# Patient Record
Sex: Female | Born: 1960 | Hispanic: Yes | Marital: Single | Smoking: Never smoker
Health system: Southern US, Community
[De-identification: ages and names within clinical notes are randomized; demographics above are authoritative.]

## PROBLEM LIST (undated history)

## (undated) DIAGNOSIS — R55 Syncope and collapse: Secondary | ICD-10-CM

## (undated) DIAGNOSIS — N159 Renal tubulo-interstitial disease, unspecified: Secondary | ICD-10-CM

## (undated) DIAGNOSIS — K859 Acute pancreatitis without necrosis or infection, unspecified: Secondary | ICD-10-CM

## (undated) HISTORY — PX: TUBAL LIGATION: SHX77

---

## 2002-03-12 ENCOUNTER — Emergency Department (HOSPITAL_COMMUNITY): Admission: EM | Admit: 2002-03-12 | Discharge: 2002-03-12 | Payer: Self-pay | Admitting: Emergency Medicine

## 2005-09-10 ENCOUNTER — Inpatient Hospital Stay (HOSPITAL_COMMUNITY): Admission: AD | Admit: 2005-09-10 | Discharge: 2005-09-13 | Payer: Self-pay | Admitting: Sports Medicine

## 2005-09-10 ENCOUNTER — Encounter: Payer: Self-pay | Admitting: Obstetrics and Gynecology

## 2005-09-10 ENCOUNTER — Ambulatory Visit: Payer: Self-pay | Admitting: Sports Medicine

## 2005-09-22 ENCOUNTER — Ambulatory Visit: Payer: Self-pay | Admitting: Family Medicine

## 2005-09-26 ENCOUNTER — Ambulatory Visit (HOSPITAL_COMMUNITY): Admission: RE | Admit: 2005-09-26 | Discharge: 2005-09-26 | Payer: Self-pay | Admitting: Family Medicine

## 2006-05-11 DIAGNOSIS — D509 Iron deficiency anemia, unspecified: Secondary | ICD-10-CM

## 2009-10-28 ENCOUNTER — Encounter (INDEPENDENT_AMBULATORY_CARE_PROVIDER_SITE_OTHER): Payer: Self-pay | Admitting: Family Medicine

## 2009-10-28 ENCOUNTER — Ambulatory Visit: Payer: Self-pay | Admitting: Internal Medicine

## 2009-10-28 LAB — CONVERTED CEMR LAB
Albumin: 4.5 g/dL (ref 3.5–5.2)
Alkaline Phosphatase: 88 units/L (ref 39–117)
BUN: 17 mg/dL (ref 6–23)
Basophils Relative: 0 % (ref 0–1)
CRP: 0.4 mg/dL (ref <0.6)
Chloride: 108 meq/L (ref 96–112)
Eosinophils Absolute: 0.1 10*3/uL (ref 0.0–0.7)
Glucose, Bld: 108 mg/dL — ABNORMAL HIGH (ref 70–99)
Lymphs Abs: 2.2 10*3/uL (ref 0.7–4.0)
Neutro Abs: 2.1 10*3/uL (ref 1.7–7.7)
Neutrophils Relative %: 44 % (ref 43–77)
TSH: 1.806 microintl units/mL (ref 0.350–4.500)
Total Protein: 7.2 g/dL (ref 6.0–8.3)

## 2009-10-29 ENCOUNTER — Encounter (INDEPENDENT_AMBULATORY_CARE_PROVIDER_SITE_OTHER): Payer: Self-pay | Admitting: Family Medicine

## 2009-11-03 ENCOUNTER — Ambulatory Visit (HOSPITAL_COMMUNITY): Admission: RE | Admit: 2009-11-03 | Discharge: 2009-11-03 | Payer: Self-pay | Admitting: Internal Medicine

## 2010-07-30 NOTE — H&P (Signed)
Evelyn Dillon, Evelyn Dillon                ACCOUNT NO.:  [id number]   MEDICAL RECORD NO.:  [id number]          PATIENT TYPE:  INP   LOCATION:  5020                         FACILITY:  [facility]   PHYSICIAN:  Melina Fiddler, MD DATE OF BIRTH:  1961/01/24   DATE OF ADMISSION:  09/10/2005  DATE OF DISCHARGE:                                HISTORY & PHYSICAL   CHIEF COMPLAINT:  Flank pain/fever.   HISTORY OF PRESENT ILLNESS:  A 50 year old Hispanic female presents with a  two-day history of flank pain that radiates to the abdomen/flank.  She has a  history of pyelonephritis x2 including at least one hospitalization.  She  has also noted burning during urination.  Her last menstrual period was Jul 22, 2005.  Urine pregnancy is negative.  She has had nausea/vomiting x24  hours and currently cannot take p.o.  She was transferred here from the  [facility] MAU (maternity admissions unit) where she was worked up.  She has had fevers and chills over the last two days.  She also ha shad  headache and generalized weakness with positive breast tenderness that is  consistent with the timing of her period.  No numbness, positive  constipation, no diarrhea.  No palpitations or shortness of breath.   PAST MEDICAL HISTORY:  1.  Status post spontaneous vaginal delivery x7.  2.  Status post bilateral tubal ligation.  3.  History of pyelonephritis x2.  No other medical problems.   MEDICATIONS:  Advil p.r.n.   ALLERGIES:  No known drug allergies.   SOCIAL HISTORY:  Hispanic female that has lived here for only one year.   FAMILY HISTORY:  None.   PHYSICAL EXAMINATION:  VITAL SIGNS:  Temperature 98, pulse 63, respiratory  rate is 20, blood pressure 105/70.  She is saturating 97% on room air, 143  pounds, 61.5 inches, tall.  GENERAL:  Appears in mild to moderate pain.  Overweight Hispanic female.  HEENT:  Normal external eyes, ears, hearing.  No thyromegaly.  PULMONARY:  Clear to auscultation  bilaterally.  BACK:  No CVA tenderness on the right.  No CVA on the left.  BREASTS:  There is bilateral breast tenderness diffusely.  CARDIOVASCULAR:  Regular rate and rhythm.  No murmurs.  2+ dorsalis pedis  pulses.  ABDOMEN:  Soft and nontender.  Normal active bowel sounds.  No  hepatosplenomegaly.  EXTREMITIES:  5/5 strength with full range of motion and no edema.  GENITOURINARY:  This exam was normal per MAU except for a yellowish  discharge.   LABORATORY DATA:  Sodium up to 138 from 134, potassium 4.6, chloride 108,  bicarbonate 25, BUN 5, creatinine 0.8.  Sugar 127, calcium 8.6, white count  12.5 with ANC of 11.2, hemoglobin 9.2, MCV was 69.5, hematocrit 28.6.  Platelets 198,000.  Lipase 13, amylase 33.  Wet prep positive for clue  cells, otherwise negative.  Urinalysis positive for nitrite and moderate  leukocyte esterase, 30 of protein, 100 of glucose, 11 to 20 whites with few  bacteria with urine culture pending.  GC and Chlamydia tests were pending.  Liver function tests were normal except for an AST that was just a touch  above normal at 47.  Urine pregnancy was negative as stated above.   ASSESSMENT:  A 50 year old Hispanic female with pyelonephritis.   PLAN:  1.  Pyelonephritis:  We will admit the patient and hydrate with lactated      Ringer at 250 mL per hour.  We will also advance diet as tolerated.  We      will treat with IV antibiotics (Cipro).  We will follow the culture      results.  The patient will get Zofran p.r.n. for nausea and      morphine/Percocet p.r.n. for pain.  2.  Microcytic anemia:  This is probably iron-deficiency anemia.  The      patient will need iron sulfate supplementation after pyelonephritis      clears.   DISPOSITION:  The patient will be discharged once she is taking p.o.  consistently and has advanced her diet.      Angeline Slim, M.D.    ______________________________  Melina Fiddler, MD    AL/MEDQ  D:  09/10/2005  T:   09/10/2005  Job:  [id number]

## 2010-07-30 NOTE — Discharge Summary (Signed)
Evelyn Dillon, Evelyn Dillon                ACCOUNT NO.:  [id number]   MEDICAL RECORD NO.:  [id number]          PATIENT TYPE:  INP   LOCATION:  5020                         FACILITY:  [facility]   PHYSICIAN:  Sylvan Cheese, M.D.       DATE OF BIRTH:  05-Sep-1960   DATE OF ADMISSION:  09/10/2005  DATE OF DISCHARGE:  09/13/2005                                 DISCHARGE SUMMARY   DISCHARGE DIAGNOSES:  1.  Pyelonephritis.  2.  Iron deficiency anemia.   DISCHARGE MEDICATIONS:  1.  Ciprofloxacin 500 mg b.i.d. x7 days.  2.  Iron sulfate 325 mg p.o. b.i.d.  3.  MiraLax 17 gram p.o. daily p.r.n. constipation.   FOLLOWUP APPOINTMENT:  Patient is to follow up with Dr. Sylvan Cheese at [facility] on July 12th at 1:23 p.m.   FOLLOWUP LABS/TESTS:  Patient will need urinalysis and urine culture in one  to two weeks to insure adequate resolution of complicated urinary tract  infection.  If patient becomes symptomatic she may require Flagyl or  bacterial vaginosis on wet prep.   PROCEDURES:  09/10/05-CT of abdomen and pelvis:  Right ureteral calculus  verses phlebolith, proximal right hydroureter, right sided perinephric  stranding.   CONSULTATIONS:  None.   HOSPITAL COURSE:  50 year old Hispanic female admitted with 2 day history of  flank pain, burning with urination, nausea, vomiting, fevers, and chills.  Patient has history of pyelonephritis x2 in the past.  GC/chlamydia and  urine pregnancy tests were all negative, however wet prep did show some clue  cells.  Urinalysis was positive for nitrates, moderate leukocyte esterase,  few bacteria, and 11-20 white blood cells.  Urine culture did grow E. coli  with sensitivity pending at the time of discharge.  The patient was started  on ciprofloxacin 400 mg IV b.i.d. upon admission.  There was some debate as  to whether a small calculus seen on abdominal CT was inside or next to the  ureter.  Her urine was screened throughout admission,  however, no stone was  recovered.  She did have a low grade fever to 100.6 the evening of 09/11/05.  By the morning of  09/12/05, she was tolerating a regular diet and denied  nausea and vomiting.   The patient was found to be anemic, but stable, throughout admission.  Hemoglobin went from 10.3 on admission to 8.3 the morning of discharge.  This is found to be partly dilutional.  Iron studies were performed.  Iron  was less than  10 and ferritin was 43, there is certainly iron deficiency anemia has a role  and she should be started on iron supplement at discharge as well as a stool  softener.   Dictation performed by Dr. Sylvan Cheese for Dr. Sherron Monday, MD.           ______________________________  Sylvan Cheese, M.D.     MJ/MEDQ  D:  09/13/2005  T:  09/13/2005  Job:  [id number]

## 2010-10-01 ENCOUNTER — Other Ambulatory Visit: Payer: Self-pay | Admitting: Family Medicine

## 2011-01-11 ENCOUNTER — Other Ambulatory Visit (HOSPITAL_COMMUNITY): Payer: Self-pay | Admitting: Family Medicine

## 2011-01-11 DIAGNOSIS — Z1231 Encounter for screening mammogram for malignant neoplasm of breast: Secondary | ICD-10-CM

## 2011-02-10 ENCOUNTER — Ambulatory Visit (HOSPITAL_COMMUNITY)
Admission: RE | Admit: 2011-02-10 | Discharge: 2011-02-10 | Disposition: A | Discharge status: Home / Self Care | Payer: Self-pay | Source: Ambulatory Visit | Attending: Family Medicine | Admitting: Family Medicine

## 2011-02-10 DIAGNOSIS — Z1231 Encounter for screening mammogram for malignant neoplasm of breast: Secondary | ICD-10-CM

## 2014-05-06 ENCOUNTER — Emergency Department (HOSPITAL_COMMUNITY): Payer: Self-pay

## 2014-05-06 ENCOUNTER — Encounter (HOSPITAL_COMMUNITY): Payer: Self-pay | Admitting: Emergency Medicine

## 2014-05-06 ENCOUNTER — Observation Stay (HOSPITAL_COMMUNITY)
Admission: EM | Admit: 2014-05-06 | Discharge: 2014-05-08 | Disposition: A | Discharge status: Home / Self Care | Payer: Self-pay | Attending: Cardiology | Admitting: Cardiology

## 2014-05-06 DIAGNOSIS — S0993XA Unspecified injury of face, initial encounter: Secondary | ICD-10-CM | POA: Diagnosis present

## 2014-05-06 DIAGNOSIS — J320 Chronic maxillary sinusitis: Secondary | ICD-10-CM | POA: Insufficient documentation

## 2014-05-06 DIAGNOSIS — R55 Syncope and collapse: Principal | ICD-10-CM | POA: Insufficient documentation

## 2014-05-06 DIAGNOSIS — R079 Chest pain, unspecified: Secondary | ICD-10-CM | POA: Insufficient documentation

## 2014-05-06 DIAGNOSIS — M79602 Pain in left arm: Secondary | ICD-10-CM | POA: Insufficient documentation

## 2014-05-06 HISTORY — DX: Renal tubulo-interstitial disease, unspecified: N15.9

## 2014-05-06 HISTORY — DX: Syncope and collapse: R55

## 2014-05-06 LAB — CBC WITH DIFFERENTIAL/PLATELET
Basophils Absolute: 0 10*3/uL (ref 0.0–0.1)
Basophils Relative: 0 % (ref 0–1)
EOS ABS: 0.1 10*3/uL (ref 0.0–0.7)
Eosinophils Relative: 2 % (ref 0–5)
HCT: 37.5 % (ref 36.0–46.0)
HEMOGLOBIN: 12.2 g/dL (ref 12.0–15.0)
LYMPHS ABS: 1.3 10*3/uL (ref 0.7–4.0)
LYMPHS PCT: 34 % (ref 12–46)
MCH: 27.5 pg (ref 26.0–34.0)
MCHC: 32.5 g/dL (ref 30.0–36.0)
MCV: 84.5 fL (ref 78.0–100.0)
Monocytes Absolute: 0.2 10*3/uL (ref 0.1–1.0)
Monocytes Relative: 5 % (ref 3–12)
NEUTROS PCT: 59 % (ref 43–77)
Neutro Abs: 2.2 10*3/uL (ref 1.7–7.7)
Platelets: 184 10*3/uL (ref 150–400)
RBC: 4.44 MIL/uL (ref 3.87–5.11)
RDW: 13.2 % (ref 11.5–15.5)
WBC: 3.8 10*3/uL — ABNORMAL LOW (ref 4.0–10.5)

## 2014-05-06 LAB — COMPREHENSIVE METABOLIC PANEL
ALBUMIN: 3.8 g/dL (ref 3.5–5.2)
ALK PHOS: 73 U/L (ref 39–117)
ALT: 21 U/L (ref 0–35)
ANION GAP: 7 (ref 5–15)
AST: 30 U/L (ref 0–37)
BILIRUBIN TOTAL: 0.3 mg/dL (ref 0.3–1.2)
BUN: 16 mg/dL (ref 6–23)
CHLORIDE: 112 mmol/L (ref 96–112)
CO2: 23 mmol/L (ref 19–32)
Calcium: 8.6 mg/dL (ref 8.4–10.5)
Creatinine, Ser: 0.74 mg/dL (ref 0.50–1.10)
GFR calc Af Amer: >90 mL/min (ref >90)
Glucose, Bld: 101 mg/dL — ABNORMAL HIGH (ref 70–99)
POTASSIUM: 4 mmol/L (ref 3.5–5.1)
Sodium: 142 mmol/L (ref 135–145)
Total Protein: 6.3 g/dL (ref 6.0–8.3)

## 2014-05-06 LAB — URINALYSIS, ROUTINE W REFLEX MICROSCOPIC
BILIRUBIN URINE: NEGATIVE
Glucose, UA: NEGATIVE mg/dL
Ketones, ur: NEGATIVE mg/dL
LEUKOCYTES UA: NEGATIVE
Nitrite: NEGATIVE
Protein, ur: NEGATIVE mg/dL
SPECIFIC GRAVITY, URINE: 1.016 (ref 1.005–1.030)
Urobilinogen, UA: 0.2 mg/dL (ref 0.0–1.0)
pH: 7.5 (ref 5.0–8.0)

## 2014-05-06 LAB — URINE MICROSCOPIC-ADD ON

## 2014-05-06 LAB — TROPONIN I

## 2014-05-06 LAB — CBG MONITORING, ED: GLUCOSE-CAPILLARY: 99 mg/dL (ref 70–99)

## 2014-05-06 LAB — TSH: TSH: 1.768 u[IU]/mL (ref 0.350–4.500)

## 2014-05-06 MED ORDER — SODIUM CHLORIDE 0.9 % IJ SOLN
3.0000 mL | Freq: Two times a day (BID) | INTRAMUSCULAR | Status: DC
  Administered 2014-05-06 – 2014-05-07 (×3): 3 mL via INTRAVENOUS

## 2014-05-06 MED ORDER — SODIUM CHLORIDE 0.9 % IV SOLN: 250.0000 mL | INTRAVENOUS | Status: DC | PRN

## 2014-05-06 MED ORDER — ASPIRIN 300 MG RE SUPP
300.0000 mg | RECTAL | Status: AC
  Filled 2014-05-06: qty 1

## 2014-05-06 MED ORDER — ASPIRIN 81 MG PO CHEW
324.0000 mg | CHEWABLE_TABLET | ORAL | Status: AC
Start: 2014-05-06 — End: 2014-05-07

## 2014-05-06 MED ORDER — SODIUM CHLORIDE 0.9 % IJ SOLN: 3.0000 mL | INTRAMUSCULAR | Status: DC | PRN

## 2014-05-06 MED ORDER — SODIUM CHLORIDE 0.9 % IV BOLUS (SEPSIS)
1000.0000 mL | Freq: Once | INTRAVENOUS | Status: AC
  Administered 2014-05-06: 1000 mL via INTRAVENOUS

## 2014-05-06 MED ORDER — ACETAMINOPHEN 325 MG PO TABS
650.0000 mg | ORAL_TABLET | ORAL | Status: DC | PRN
  Administered 2014-05-06 – 2014-05-07 (×3): 650 mg via ORAL
  Filled 2014-05-06 (×2): qty 2

## 2014-05-06 MED ORDER — ONDANSETRON HCL 4 MG/2ML IJ SOLN: 4.0000 mg | Freq: Four times a day (QID) | INTRAMUSCULAR | Status: DC | PRN

## 2014-05-06 MED ORDER — MORPHINE SULFATE 2 MG/ML IJ SOLN
2.0000 mg | Freq: Once | INTRAMUSCULAR | Status: AC
  Administered 2014-05-06: 2 mg via INTRAVENOUS
  Filled 2014-05-06: qty 1

## 2014-05-06 MED ORDER — HEPARIN SODIUM (PORCINE) 5000 UNIT/ML IJ SOLN
5000.0000 [IU] | Freq: Three times a day (TID) | INTRAMUSCULAR | Status: DC
  Administered 2014-05-06 – 2014-05-08 (×5): 5000 [IU] via SUBCUTANEOUS
  Filled 2014-05-06 (×10): qty 1

## 2014-05-06 MED ORDER — ASPIRIN EC 81 MG PO TBEC
81.0000 mg | DELAYED_RELEASE_TABLET | Freq: Every day | ORAL | Status: DC
  Administered 2014-05-07 – 2014-05-08 (×2): 81 mg via ORAL
  Filled 2014-05-06 (×2): qty 1

## 2014-05-06 NOTE — ED Notes (Signed)
Reports felt a small amount of chest pain, became dizzy, passed out and fell on concrete floor at [employer] where she works. No chest pain at all since waking. Reports pain in left side of head and top lip on left side. Small laceration there; bleeding controlled.

## 2014-05-06 NOTE — ED Notes (Signed)
Attempted report 

## 2014-05-06 NOTE — ED Notes (Signed)
Given 324mg  ASA in route by EMS.

## 2014-05-06 NOTE — H&P (Signed)
Patient Information    Patient Name Sex DOB SSN   Evelyn Dillon, Evelyn Dillon I Female [birth year] [id number]    Consult Note by Lelon Perla, MD at 05/06/2014 12:32 PM    Author: Lelon Perla, MD Service: Cardiology Author Type: Physician   Filed: 05/06/2014 12:40 PM Note Time: 05/06/2014 12:32 PM Status: Signed   Editor: Lelon Perla, MD (Physician)     Expand All Collapse All      Primary cardiologist: New  HPI: a 54 year old female with no prior cardiac history for evaluation of syncope. Note patient does not speak [place]. History is obtained with the assistance of Dr Vanita Panda. She typically does not have dyspnea on exertion, orthopnea, PND, pedal edema and no history of syncope. Occasional brief palpitations. She has had several brief episodes of chest pain in the past year not related to exertion. She typically does not have exertional chest pain. She was working today and developed pain in the left chest area that radiated to her head. After several seconds she had frank syncope. Duration of unconsciousness unclear. There was no preceding palpitations, nausea, diaphoresis or dyspnea. Note her pain was not pleuritic. She has not traveled recently. There was no incontinence or seizure activity. She did strike her head resulting in trauma. At present she has no other complaints.   (Not in a hospital admission)  No Known Allergies   Past Medical History  Diagnosis Date  . Kidney infection     Past Surgical History  Procedure Laterality Date  . No prior surgery      History   Social History  . Marital Status: Married    Spouse Name: N/A  . Number of Children: 7  . Years of Education: N/A   Occupational History  . Not on file.   Social History Main Topics  . Smoking status: Never Smoker   . Smokeless tobacco: Not on file  . Alcohol Use: No  . Drug Use: No  . Sexual Activity: Not on file   Other Topics  Concern  . Not on file   Social History Narrative  . No narrative on file    Family History  Problem Relation Age of Onset  . Diabetes Mellitus I Sister   . Heart disease Sister     Died at 104    ROS: Left facial pain but no fevers or chills, productive cough, hemoptysis, dysphasia, odynophagia, melena, hematochezia, dysuria, hematuria, rash, seizure activity, orthopnea, PND, pedal edema, claudication. Remaining systems are negative.  Physical Exam:   Blood pressure 132/71, pulse 68, temperature 97.5 F (36.4 C), temperature source Oral, resp. rate 15, SpO2 98 %.  General: Well developed/well nourished in NAD Skin warm/dry Patient not depressed No peripheral clubbing Back-normal HEENT-contusion and trauma to left orbital area Neck supple/normal carotid upstroke bilaterally; no bruits; no JVD; no thyromegaly chest - CTA/ normal expansion CV - RRR/normal S1 and S2; no rubs or gallops; PMI nondisplaced; 1/6 systolic ejection murmur. No change with Valsalva. Abdomen -NT/ND, no HSM, no mass, + bowel sounds, no bruit 2+ femoral pulses, no bruits Ext-no edema, chords, 2+ DP; some pain in left shin numerous with palpation. Neuro-grossly nonfocal  ECG sinus rhythm with RV conduction delay   Lab Results Last 48 Hours    Results for orders placed or performed during the hospital encounter of 05/06/14 (from the past 48 hour(s))  CBG monitoring, ED Status: None   Collection Time: 05/06/14 9:35 AM  Result Value Ref Range   Glucose-Capillary 99  70 - 99 mg/dL  CBC WITH DIFFERENTIAL Status: Abnormal   Collection Time: 05/06/14 9:55 AM  Result Value Ref Range   WBC 3.8 (L) 4.0 - 10.5 K/uL   RBC 4.44 3.87 - 5.11 MIL/uL   Hemoglobin 12.2 12.0 - 15.0 g/dL   HCT 37.5 36.0 - 46.0 %   MCV 84.5 78.0 - 100.0 fL   MCH 27.5 26.0 - 34.0 pg   MCHC 32.5 30.0 - 36.0 g/dL   RDW 13.2 11.5 - 15.5 %    Platelets 184 150 - 400 K/uL   Neutrophils Relative % 59 43 - 77 %   Neutro Abs 2.2 1.7 - 7.7 K/uL   Lymphocytes Relative 34 12 - 46 %   Lymphs Abs 1.3 0.7 - 4.0 K/uL   Monocytes Relative 5 3 - 12 %   Monocytes Absolute 0.2 0.1 - 1.0 K/uL   Eosinophils Relative 2 0 - 5 %   Eosinophils Absolute 0.1 0.0 - 0.7 K/uL   Basophils Relative 0 0 - 1 %   Basophils Absolute 0.0 0.0 - 0.1 K/uL  Comprehensive metabolic panel Status: Abnormal   Collection Time: 05/06/14 9:55 AM  Result Value Ref Range   Sodium 142 135 - 145 mmol/L   Potassium 4.0 3.5 - 5.1 mmol/L   Chloride 112 96 - 112 mmol/L   CO2 23 19 - 32 mmol/L   Glucose, Bld 101 (H) 70 - 99 mg/dL   BUN 16 6 - 23 mg/dL   Creatinine, Ser 0.74 0.50 - 1.10 mg/dL   Calcium 8.6 8.4 - 10.5 mg/dL   Total Protein 6.3 6.0 - 8.3 g/dL   Albumin 3.8 3.5 - 5.2 g/dL   AST 30 0 - 37 U/L   ALT 21 0 - 35 U/L   Alkaline Phosphatase 73 39 - 117 U/L   Total Bilirubin 0.3 0.3 - 1.2 mg/dL   GFR calc non Af Amer >90 >90 mL/min   GFR calc Af Amer >90 >90 mL/min    Comment: (NOTE) The eGFR has been calculated using the CKD EPI equation. This calculation has not been validated in all clinical situations. eGFR's persistently <90 mL/min signify possible Chronic Kidney Disease.    Anion gap 7 5 - 15  Troponin I Status: None   Collection Time: 05/06/14 9:55 AM  Result Value Ref Range   Troponin I <0.03 <0.031 ng/mL    Comment:   NO INDICATION OF MYOCARDIAL INJURY.   Urinalysis, Routine w reflex microscopic Status: Abnormal   Collection Time: 05/06/14 10:15 AM  Result Value Ref Range   Color, Urine YELLOW YELLOW   APPearance CLEAR CLEAR   Specific Gravity, Urine 1.016 1.005 - 1.030   pH 7.5 5.0 - 8.0   Glucose, UA NEGATIVE NEGATIVE mg/dL   Hgb urine dipstick MODERATE (A)  NEGATIVE   Bilirubin Urine NEGATIVE NEGATIVE   Ketones, ur NEGATIVE NEGATIVE mg/dL   Protein, ur NEGATIVE NEGATIVE mg/dL   Urobilinogen, UA 0.2 0.0 - 1.0 mg/dL   Nitrite NEGATIVE NEGATIVE   Leukocytes, UA NEGATIVE NEGATIVE  Urine microscopic-add on Status: Abnormal   Collection Time: 05/06/14 10:15 AM  Result Value Ref Range   Squamous Epithelial / LPF FEW (A) RARE   WBC, UA 0-2 <3 WBC/hpf   RBC / HPF 0-2 <3 RBC/hpf   Bacteria, UA FEW (A) RARE       Imaging Results (Last 48 hours)    Dg Chest 2 View  05/06/2014 CLINICAL DATA: Syncope this morning. EXAM: CHEST  2 VIEW COMPARISON: None. FINDINGS: Lateral view degraded by patient arm position. Midline trachea. Normal heart size. Mildly tortuous thoracic aorta. No pleural effusion or pneumothorax. Clear lungs. IMPRESSION: No acute cardiopulmonary disease. Electronically Signed By: Abigail Miyamoto M.D. On: 05/06/2014 11:28   Ct Cervical Spine Wo Contrast  05/06/2014 CLINICAL DATA: Patient passed out with facial and neck pain EXAM: CT MAXILLOFACIAL WITHOUT CONTRAST CT CERVICAL SPINE WITHOUT CONTRAST TECHNIQUE: Multidetector CT imaging of the cervical spine and maxillofacial structures were performed using the standard protocol without intravenous contrast. Multiplanar CT image reconstructions of the cervical spine and maxillofacial structures were also generated. COMPARISON: None. FINDINGS: CT MAXILLOFACIAL FINDINGS There is mild soft tissue swelling over the upper left face. There is no demonstrable fracture or dislocation. No intraorbital lesions are identified. There is mucosal thickening with a small air-fluid level in the right maxillary antrum. Other paranasal sinuses are clear. Ostiomeatal unit complexes are patent bilaterally. The nasal turbinates are prominent bilaterally without nares obstruction. The mastoid air cells bilaterally are clear. A portion of the right  superior for smaller is missing. There is no appreciable adenopathy. Visualized brain parenchyma is unremarkable. CT CERVICAL SPINE FINDINGS There is no fracture or spondylolisthesis. Prevertebral soft tissues and predental space regions are normal. There is moderate disc space narrowing at C5-6 and C6-7. There are small anterior osteophytes at C5, C6, and C7. There is facet hypertrophy at several levels bilaterally. There is no nerve root edema or effacement. No disc extrusion or stenosis. IMPRESSION: CT maxillofacial: No fracture or dislocation. Soft tissue swelling over upper left face. Right maxillary sinus disease. Nasal turbinate edema bilaterally. Ostiomeatal unit complexes are patent bilaterally. No intraorbital lesions. A portion of the right superior first molar is missing. CT cervical spine: Multilevel osteoarthritic change. No fracture or spondylolisthesis. Electronically Signed By: Lowella Grip III M.D. On: 05/06/2014 11:18   Ct Maxillofacial Wo Cm  05/06/2014 CLINICAL DATA: Patient passed out with facial and neck pain EXAM: CT MAXILLOFACIAL WITHOUT CONTRAST CT CERVICAL SPINE WITHOUT CONTRAST TECHNIQUE: Multidetector CT imaging of the cervical spine and maxillofacial structures were performed using the standard protocol without intravenous contrast. Multiplanar CT image reconstructions of the cervical spine and maxillofacial structures were also generated. COMPARISON: None. FINDINGS: CT MAXILLOFACIAL FINDINGS There is mild soft tissue swelling over the upper left face. There is no demonstrable fracture or dislocation. No intraorbital lesions are identified. There is mucosal thickening with a small air-fluid level in the right maxillary antrum. Other paranasal sinuses are clear. Ostiomeatal unit complexes are patent bilaterally. The nasal turbinates are prominent bilaterally without nares obstruction. The mastoid air cells bilaterally are clear. A portion of the right superior  for smaller is missing. There is no appreciable adenopathy. Visualized brain parenchyma is unremarkable. CT CERVICAL SPINE FINDINGS There is no fracture or spondylolisthesis. Prevertebral soft tissues and predental space regions are normal. There is moderate disc space narrowing at C5-6 and C6-7. There are small anterior osteophytes at C5, C6, and C7. There is facet hypertrophy at several levels bilaterally. There is no nerve root edema or effacement. No disc extrusion or stenosis. IMPRESSION: CT maxillofacial: No fracture or dislocation. Soft tissue swelling over upper left face. Right maxillary sinus disease. Nasal turbinate edema bilaterally. Ostiomeatal unit complexes are patent bilaterally. No intraorbital lesions. A portion of the right superior first molar is missing. CT cervical spine: Multilevel osteoarthritic change. No fracture or spondylolisthesis. Electronically Signed By: Lowella Grip III M.D. On: 05/06/2014 11:18     Assessment/Plan 1 syncope-etiology unclear. As  it does not sound vaguely mediated. She does not describe a history of orthostasis. She did have vague chest pain several seconds prior to the above. Plan to admit and cycle enzymes. Follow telemetry. Check echocardiogram for LV function. If enzymes negative plan stress nuclear study to screen for ischemia. No risk factors for pulmonary embolus. If LV function normal, enzymes negative and no ischemia on nuclear study then she may need an outpatient event monitor. I have explained that she should not drive for 6 months. 2 left upper extremity pain-will obtain x-rays of left humerus. 3 facial trauma-CT shows no fracture.  Kirk Ruths MD 05/06/2014, 12:33 PM

## 2014-05-06 NOTE — ED Notes (Signed)
Patient returned from Xray/CT.

## 2014-05-06 NOTE — ED Provider Notes (Signed)
CSN: [id number]     Arrival date & time 05/06/14  0904 History   First MD Initiated Contact with Patient 05/06/14 [phone]     Chief Complaint  Patient presents with  . Loss of Consciousness  . Chest Pain     (Consider location/radiation/quality/duration/timing/severity/associated sxs/prior Treatment) HPI Patient presents after an episode of syncope. Patient is generally well, denies medical problems, was in her usual state of health. She had an episode of left-sided chest pain, felt dizzy, lost consciousness. On awakening she had no chest pain, but she did have pain in her left face, mouth. No medication taken following the fall. Currently the patient denies chest pain, dyspnea, acknowledges only facial pain as a complaint. She denies history of any, cardiac evaluation thus far. No recent health issues, travel issues, medication issues, work changes. Past Medical History  Diagnosis Date  . Kidney infection    Past Surgical History  Procedure Laterality Date  . No prior surgery     Family History  Problem Relation Age of Onset  . Diabetes Mellitus I Sister   . Heart disease Sister     Died at 4538   History  Substance Use Topics  . Smoking status: Never Smoker   . Smokeless tobacco: Not on file  . Alcohol Use: No   OB History    No data available     Review of Systems  Constitutional:       Per HPI, otherwise negative  HENT:       Per HPI, otherwise negative  Respiratory:       Per HPI, otherwise negative  Cardiovascular:       Per HPI, otherwise negative  Gastrointestinal: Negative for vomiting.  Endocrine:       Negative aside from HPI  Genitourinary:       Neg aside from HPI   Musculoskeletal:       Per HPI, otherwise negative  Skin: Positive for wound.  Allergic/Immunologic: Negative for immunocompromised state.  Neurological: Positive for dizziness and syncope. Negative for speech difficulty, weakness and headaches.      Allergies  Review of  patient's allergies indicates no known allergies.  Home Medications   Prior to Admission medications   Not on File   BP 112/71 mmHg  Pulse 59  Temp(Src) 98.2 F (36.8 C) (Oral)  Resp 18  SpO2 98% Physical Exam  Constitutional: She is oriented to person, place, and time. She appears well-developed and well-nourished. No distress.  HENT:  Head: Normocephalic and atraumatic.    Mouth/Throat:    Eyes: Conjunctivae and EOM are normal.  Neck:    Cardiovascular: Normal rate and regular rhythm.   Pulmonary/Chest: Effort normal and breath sounds normal. No stridor. No respiratory distress.  Abdominal: She exhibits no distension.  Musculoskeletal: She exhibits no edema.  Neurological: She is alert and oriented to person, place, and time. She displays no atrophy and no tremor. No cranial nerve deficit or sensory deficit. She exhibits normal muscle tone. She displays no seizure activity.  Skin: Skin is warm and dry.  Psychiatric: She has a normal mood and affect.  Nursing note and vitals reviewed.   ED Course  Procedures (including critical care time) Labs Review Labs Reviewed  CBC WITH DIFFERENTIAL/PLATELET - Abnormal; Notable for the following:    WBC 3.8 (*)    All other components within normal limits  COMPREHENSIVE METABOLIC PANEL - Abnormal; Notable for the following:    Glucose, Bld 101 (*)    All  other components within normal limits  URINALYSIS, ROUTINE W REFLEX MICROSCOPIC - Abnormal; Notable for the following:    Hgb urine dipstick MODERATE (*)    All other components within normal limits  URINE MICROSCOPIC-ADD ON - Abnormal; Notable for the following:    Squamous Epithelial / LPF FEW (*)    Bacteria, UA FEW (*)    All other components within normal limits  TROPONIN I  POCT CBG (FASTING - GLUCOSE)-MANUAL ENTRY  CBG MONITORING, ED    Imaging Review Dg Chest 2 View  05/06/2014   CLINICAL DATA:  Syncope this morning.  EXAM: CHEST  2 VIEW  COMPARISON:  None.   FINDINGS: Lateral view degraded by patient arm position. Midline trachea. Normal heart size. Mildly tortuous thoracic aorta. No pleural effusion or pneumothorax. Clear lungs.  IMPRESSION: No acute cardiopulmonary disease.   Electronically Signed   By: Jeronimo Greaves M.D.   On: 05/06/2014 11:28   Ct Cervical Spine Wo Contrast  05/06/2014   CLINICAL DATA:  Patient passed out with facial and neck pain  EXAM: CT MAXILLOFACIAL WITHOUT CONTRAST  CT CERVICAL SPINE WITHOUT CONTRAST  TECHNIQUE: Multidetector CT imaging of the cervical spine and maxillofacial structures were performed using the standard protocol without intravenous contrast. Multiplanar CT image reconstructions of the cervical spine and maxillofacial structures were also generated.  COMPARISON:  None.  FINDINGS: CT MAXILLOFACIAL FINDINGS  There is mild soft tissue swelling over the upper left face. There is no demonstrable fracture or dislocation. No intraorbital lesions are identified. There is mucosal thickening with a small air-fluid level in the right maxillary antrum. Other paranasal sinuses are clear. Ostiomeatal unit complexes are patent bilaterally. The nasal turbinates are prominent bilaterally without nares obstruction. The mastoid air cells bilaterally are clear. A portion of the right superior for smaller is missing. There is no appreciable adenopathy. Visualized brain parenchyma is unremarkable.  CT CERVICAL SPINE FINDINGS  There is no fracture or spondylolisthesis. Prevertebral soft tissues and predental space regions are normal. There is moderate disc space narrowing at C5-6 and C6-7. There are small anterior osteophytes at C5, C6, and C7. There is facet hypertrophy at several levels bilaterally. There is no nerve root edema or effacement. No disc extrusion or stenosis.  IMPRESSION: CT maxillofacial: No fracture or dislocation. Soft tissue swelling over upper left face. Right maxillary sinus disease. Nasal turbinate edema bilaterally.  Ostiomeatal unit complexes are patent bilaterally. No intraorbital lesions. A portion of the right superior first molar is missing.  CT cervical spine: Multilevel osteoarthritic change. No fracture or spondylolisthesis.   Electronically Signed   By: Bretta Bang III M.D.   On: 05/06/2014 11:18   Ct Maxillofacial Wo Cm  05/06/2014   CLINICAL DATA:  Patient passed out with facial and neck pain  EXAM: CT MAXILLOFACIAL WITHOUT CONTRAST  CT CERVICAL SPINE WITHOUT CONTRAST  TECHNIQUE: Multidetector CT imaging of the cervical spine and maxillofacial structures were performed using the standard protocol without intravenous contrast. Multiplanar CT image reconstructions of the cervical spine and maxillofacial structures were also generated.  COMPARISON:  None.  FINDINGS: CT MAXILLOFACIAL FINDINGS  There is mild soft tissue swelling over the upper left face. There is no demonstrable fracture or dislocation. No intraorbital lesions are identified. There is mucosal thickening with a small air-fluid level in the right maxillary antrum. Other paranasal sinuses are clear. Ostiomeatal unit complexes are patent bilaterally. The nasal turbinates are prominent bilaterally without nares obstruction. The mastoid air cells bilaterally are clear. A portion  of the right superior for smaller is missing. There is no appreciable adenopathy. Visualized brain parenchyma is unremarkable.  CT CERVICAL SPINE FINDINGS  There is no fracture or spondylolisthesis. Prevertebral soft tissues and predental space regions are normal. There is moderate disc space narrowing at C5-6 and C6-7. There are small anterior osteophytes at C5, C6, and C7. There is facet hypertrophy at several levels bilaterally. There is no nerve root edema or effacement. No disc extrusion or stenosis.  IMPRESSION: CT maxillofacial: No fracture or dislocation. Soft tissue swelling over upper left face. Right maxillary sinus disease. Nasal turbinate edema bilaterally.  Ostiomeatal unit complexes are patent bilaterally. No intraorbital lesions. A portion of the right superior first molar is missing.  CT cervical spine: Multilevel osteoarthritic change. No fracture or spondylolisthesis.   Electronically Signed   By: Bretta Bang III M.D.   On: 05/06/2014 11:18     EKG Interpretation   Date/Time:  Tuesday May 06 2014 09:13:31 EST Ventricular Rate:  68 PR Interval:  141 QRS Duration: 96 QT Interval:  412 QTC Calculation: 438 R Axis:   38 Text Interpretation:  Sinus rhythm RSR' in V1 or V2, probably normal  variant Sinus rhythm RSR prime Artifact Borderline ECG Confirmed by  Gerhard Munch  MD ([id number]) on 05/06/2014 9:38:24 AM     Cardiac 70 sinus rhythm normal Pulse ox 100% room air normal I discussed the patient's presentation with her family members. MDM   Patient presents after an episode of syncope that occurred after some onset of chest pain while at work. Here the patient is pain-free, aside from sequelae from her facial trauma. No evidence for ongoing ischemia, or neurologic dysfunction. Patient's CT scans did not demonstrate fracture or intracranial bleed. Patient does have broken teeth, split in her lip, but no wounds requiring suture repair. Patient was noted to our cardiology team for further evaluation and management.  Gerhard Munch, MD 05/06/14 [phone]

## 2014-05-06 NOTE — ED Notes (Signed)
CBG- 99 

## 2014-05-06 NOTE — ED Notes (Signed)
From work, pt c/o of CP and then had a syncopal episode, is A/O on arrival, VSS, no CP on arrival, CBG 135, minor oral trauma, 20g LAC

## 2014-05-06 NOTE — ED Notes (Signed)
Patient transported to X-Ray/CT. 

## 2014-05-06 NOTE — Consult Note (Signed)
Primary cardiologist: New  HPI: a 54 year old female with no prior cardiac history for evaluation of syncope. Note patient does not speak [place]. History is obtained with the assistance of Dr Vanita Panda. She typically does not have dyspnea on exertion, orthopnea, PND, pedal edema and no history of syncope. Occasional brief palpitations. She has had several brief episodes of chest pain in the past year not related to exertion. She typically does not have exertional chest pain. She was working today and developed pain in the left chest area that radiated to her head. After several seconds she had frank syncope. Duration of unconsciousness unclear. There was no preceding palpitations, nausea, diaphoresis or dyspnea. Note her pain was not pleuritic. She has not traveled recently. There was no incontinence or seizure activity. She did strike her head resulting in trauma. At present she has no other complaints.   (Not in a hospital admission)  No Known Allergies   Past Medical History  Diagnosis Date  . Kidney infection     Past Surgical History  Procedure Laterality Date  . No prior surgery      History   Social History  . Marital Status: Married    Spouse Name: N/A  . Number of Children: 7  . Years of Education: N/A   Occupational History  . Not on file.   Social History Main Topics  . Smoking status: Never Smoker   . Smokeless tobacco: Not on file  . Alcohol Use: No  . Drug Use: No  . Sexual Activity: Not on file   Other Topics Concern  . Not on file   Social History Narrative  . No narrative on file    Family History  Problem Relation Age of Onset  . Diabetes Mellitus I Sister   . Heart disease Sister     Died at 33    ROS:  Left facial pain but no fevers or chills, productive cough, hemoptysis, dysphasia, odynophagia, melena, hematochezia, dysuria, hematuria, rash, seizure activity, orthopnea, PND, pedal edema, claudication. Remaining systems are  negative.  Physical Exam:   Blood pressure 132/71, pulse 68, temperature 97.5 F (36.4 C), temperature source Oral, resp. rate 15, SpO2 98 %.  General:  Well developed/well nourished in NAD Skin warm/dry Patient not depressed No peripheral clubbing Back-normal HEENT-contusion and trauma to left orbital area Neck supple/normal carotid upstroke bilaterally; no bruits; no JVD; no thyromegaly chest - CTA/ normal expansion CV - RRR/normal S1 and S2; no rubs or gallops;  PMI nondisplaced; 1/6 systolic ejection murmur. No change with Valsalva. Abdomen -NT/ND, no HSM, no mass, + bowel sounds, no bruit 2+ femoral pulses, no bruits Ext-no edema, chords, 2+ DP; some pain in left shin numerous with palpation. Neuro-grossly nonfocal  ECG sinus rhythm with RV conduction delay  Results for orders placed or performed during the hospital encounter of 05/06/14 (from the past 48 hour(s))  CBG monitoring, ED     Status: None   Collection Time: 05/06/14  9:35 AM  Result Value Ref Range   Glucose-Capillary 99 70 - 99 mg/dL  CBC WITH DIFFERENTIAL     Status: Abnormal   Collection Time: 05/06/14  9:55 AM  Result Value Ref Range   WBC 3.8 (L) 4.0 - 10.5 K/uL   RBC 4.44 3.87 - 5.11 MIL/uL   Hemoglobin 12.2 12.0 - 15.0 g/dL   HCT 37.5 36.0 - 46.0 %   MCV 84.5 78.0 - 100.0 fL   MCH 27.5 26.0 - 34.0 pg  MCHC 32.5 30.0 - 36.0 g/dL   RDW 13.2 11.5 - 15.5 %   Platelets 184 150 - 400 K/uL   Neutrophils Relative % 59 43 - 77 %   Neutro Abs 2.2 1.7 - 7.7 K/uL   Lymphocytes Relative 34 12 - 46 %   Lymphs Abs 1.3 0.7 - 4.0 K/uL   Monocytes Relative 5 3 - 12 %   Monocytes Absolute 0.2 0.1 - 1.0 K/uL   Eosinophils Relative 2 0 - 5 %   Eosinophils Absolute 0.1 0.0 - 0.7 K/uL   Basophils Relative 0 0 - 1 %   Basophils Absolute 0.0 0.0 - 0.1 K/uL  Comprehensive metabolic panel     Status: Abnormal   Collection Time: 05/06/14  9:55 AM  Result Value Ref Range   Sodium 142 135 - 145 mmol/L   Potassium 4.0  3.5 - 5.1 mmol/L   Chloride 112 96 - 112 mmol/L   CO2 23 19 - 32 mmol/L   Glucose, Bld 101 (H) 70 - 99 mg/dL   BUN 16 6 - 23 mg/dL   Creatinine, Ser 0.74 0.50 - 1.10 mg/dL   Calcium 8.6 8.4 - 10.5 mg/dL   Total Protein 6.3 6.0 - 8.3 g/dL   Albumin 3.8 3.5 - 5.2 g/dL   AST 30 0 - 37 U/L   ALT 21 0 - 35 U/L   Alkaline Phosphatase 73 39 - 117 U/L   Total Bilirubin 0.3 0.3 - 1.2 mg/dL   GFR calc non Af Amer >90 >90 mL/min   GFR calc Af Amer >90 >90 mL/min    Comment: (NOTE) The eGFR has been calculated using the CKD EPI equation. This calculation has not been validated in all clinical situations. eGFR's persistently <90 mL/min signify possible Chronic Kidney Disease.    Anion gap 7 5 - 15  Troponin I     Status: None   Collection Time: 05/06/14  9:55 AM  Result Value Ref Range   Troponin I <0.03 <0.031 ng/mL    Comment:        NO INDICATION OF MYOCARDIAL INJURY.   Urinalysis, Routine w reflex microscopic     Status: Abnormal   Collection Time: 05/06/14 10:15 AM  Result Value Ref Range   Color, Urine YELLOW YELLOW   APPearance CLEAR CLEAR   Specific Gravity, Urine 1.016 1.005 - 1.030   pH 7.5 5.0 - 8.0   Glucose, UA NEGATIVE NEGATIVE mg/dL   Hgb urine dipstick MODERATE (A) NEGATIVE   Bilirubin Urine NEGATIVE NEGATIVE   Ketones, ur NEGATIVE NEGATIVE mg/dL   Protein, ur NEGATIVE NEGATIVE mg/dL   Urobilinogen, UA 0.2 0.0 - 1.0 mg/dL   Nitrite NEGATIVE NEGATIVE   Leukocytes, UA NEGATIVE NEGATIVE  Urine microscopic-add on     Status: Abnormal   Collection Time: 05/06/14 10:15 AM  Result Value Ref Range   Squamous Epithelial / LPF FEW (A) RARE   WBC, UA 0-2 <3 WBC/hpf   RBC / HPF 0-2 <3 RBC/hpf   Bacteria, UA FEW (A) RARE    Dg Chest 2 View  05/06/2014   CLINICAL DATA:  Syncope this morning.  EXAM: CHEST  2 VIEW  COMPARISON:  None.  FINDINGS: Lateral view degraded by patient arm position. Midline trachea. Normal heart size. Mildly tortuous thoracic aorta. No pleural  effusion or pneumothorax. Clear lungs.  IMPRESSION: No acute cardiopulmonary disease.   Electronically Signed   By: Abigail Miyamoto M.D.   On: 05/06/2014 11:28   Ct  Cervical Spine Wo Contrast  05/06/2014   CLINICAL DATA:  Patient passed out with facial and neck pain  EXAM: CT MAXILLOFACIAL WITHOUT CONTRAST  CT CERVICAL SPINE WITHOUT CONTRAST  TECHNIQUE: Multidetector CT imaging of the cervical spine and maxillofacial structures were performed using the standard protocol without intravenous contrast. Multiplanar CT image reconstructions of the cervical spine and maxillofacial structures were also generated.  COMPARISON:  None.  FINDINGS: CT MAXILLOFACIAL FINDINGS  There is mild soft tissue swelling over the upper left face. There is no demonstrable fracture or dislocation. No intraorbital lesions are identified. There is mucosal thickening with a small air-fluid level in the right maxillary antrum. Other paranasal sinuses are clear. Ostiomeatal unit complexes are patent bilaterally. The nasal turbinates are prominent bilaterally without nares obstruction. The mastoid air cells bilaterally are clear. A portion of the right superior for smaller is missing. There is no appreciable adenopathy. Visualized brain parenchyma is unremarkable.  CT CERVICAL SPINE FINDINGS  There is no fracture or spondylolisthesis. Prevertebral soft tissues and predental space regions are normal. There is moderate disc space narrowing at C5-6 and C6-7. There are small anterior osteophytes at C5, C6, and C7. There is facet hypertrophy at several levels bilaterally. There is no nerve root edema or effacement. No disc extrusion or stenosis.  IMPRESSION: CT maxillofacial: No fracture or dislocation. Soft tissue swelling over upper left face. Right maxillary sinus disease. Nasal turbinate edema bilaterally. Ostiomeatal unit complexes are patent bilaterally. No intraorbital lesions. A portion of the right superior first molar is missing.  CT cervical  spine: Multilevel osteoarthritic change. No fracture or spondylolisthesis.   Electronically Signed   By: Lowella Grip III M.D.   On: 05/06/2014 11:18   Ct Maxillofacial Wo Cm  05/06/2014   CLINICAL DATA:  Patient passed out with facial and neck pain  EXAM: CT MAXILLOFACIAL WITHOUT CONTRAST  CT CERVICAL SPINE WITHOUT CONTRAST  TECHNIQUE: Multidetector CT imaging of the cervical spine and maxillofacial structures were performed using the standard protocol without intravenous contrast. Multiplanar CT image reconstructions of the cervical spine and maxillofacial structures were also generated.  COMPARISON:  None.  FINDINGS: CT MAXILLOFACIAL FINDINGS  There is mild soft tissue swelling over the upper left face. There is no demonstrable fracture or dislocation. No intraorbital lesions are identified. There is mucosal thickening with a small air-fluid level in the right maxillary antrum. Other paranasal sinuses are clear. Ostiomeatal unit complexes are patent bilaterally. The nasal turbinates are prominent bilaterally without nares obstruction. The mastoid air cells bilaterally are clear. A portion of the right superior for smaller is missing. There is no appreciable adenopathy. Visualized brain parenchyma is unremarkable.  CT CERVICAL SPINE FINDINGS  There is no fracture or spondylolisthesis. Prevertebral soft tissues and predental space regions are normal. There is moderate disc space narrowing at C5-6 and C6-7. There are small anterior osteophytes at C5, C6, and C7. There is facet hypertrophy at several levels bilaterally. There is no nerve root edema or effacement. No disc extrusion or stenosis.  IMPRESSION: CT maxillofacial: No fracture or dislocation. Soft tissue swelling over upper left face. Right maxillary sinus disease. Nasal turbinate edema bilaterally. Ostiomeatal unit complexes are patent bilaterally. No intraorbital lesions. A portion of the right superior first molar is missing.  CT cervical spine:  Multilevel osteoarthritic change. No fracture or spondylolisthesis.   Electronically Signed   By: Lowella Grip III M.D.   On: 05/06/2014 11:18    Assessment/Plan 1 syncope-etiology unclear. As it does not sound  vaguely mediated. She does not describe a history of orthostasis.  She did have vague chest pain several seconds prior to the above. Plan to admit and cycle enzymes. Follow telemetry. Check echocardiogram for LV function. If enzymes negative plan stress nuclear study to screen for ischemia. No risk factors for pulmonary embolus. If LV function normal, enzymes negative and no ischemia on nuclear study then she may need an outpatient event monitor. I have explained that she should not drive for 6 months. 2 left upper extremity pain-will obtain x-rays of left humerus. 3 facial trauma-CT shows no fracture.  Kirk Ruths MD 05/06/2014, 12:33 PM

## 2014-05-06 NOTE — ED Notes (Signed)
Dr.Lockwood at bedside. Translating for patient.

## 2014-05-06 NOTE — ED Notes (Signed)
Patient was given a cup of ice water. 

## 2014-05-07 ENCOUNTER — Observation Stay (HOSPITAL_COMMUNITY): Payer: MEDICAID

## 2014-05-07 ENCOUNTER — Other Ambulatory Visit: Payer: Self-pay | Admitting: Nurse Practitioner

## 2014-05-07 ENCOUNTER — Observation Stay (HOSPITAL_COMMUNITY): Payer: Self-pay

## 2014-05-07 DIAGNOSIS — R55 Syncope and collapse: Secondary | ICD-10-CM

## 2014-05-07 DIAGNOSIS — R079 Chest pain, unspecified: Secondary | ICD-10-CM

## 2014-05-07 DIAGNOSIS — M79602 Pain in left arm: Secondary | ICD-10-CM

## 2014-05-07 DIAGNOSIS — S0993XA Unspecified injury of face, initial encounter: Secondary | ICD-10-CM | POA: Diagnosis present

## 2014-05-07 LAB — CBC
HCT: 36.6 % (ref 36.0–46.0)
Hemoglobin: 11.9 g/dL — ABNORMAL LOW (ref 12.0–15.0)
MCH: 27.7 pg (ref 26.0–34.0)
MCHC: 32.5 g/dL (ref 30.0–36.0)
MCV: 85.1 fL (ref 78.0–100.0)
Platelets: 214 10*3/uL (ref 150–400)
RBC: 4.3 MIL/uL (ref 3.87–5.11)
RDW: 13.5 % (ref 11.5–15.5)
WBC: 4.4 10*3/uL (ref 4.0–10.5)

## 2014-05-07 LAB — T4, FREE: Free T4: 1.17 ng/dL (ref 0.80–1.80)

## 2014-05-07 LAB — BASIC METABOLIC PANEL
ANION GAP: 5 (ref 5–15)
BUN: 13 mg/dL (ref 6–23)
CO2: 24 mmol/L (ref 19–32)
Calcium: 9 mg/dL (ref 8.4–10.5)
Chloride: 108 mmol/L (ref 96–112)
Creatinine, Ser: 0.68 mg/dL (ref 0.50–1.10)
GFR calc Af Amer: >90 mL/min (ref >90)
Glucose, Bld: 106 mg/dL — ABNORMAL HIGH (ref 70–99)
POTASSIUM: 4 mmol/L (ref 3.5–5.1)
SODIUM: 137 mmol/L (ref 135–145)

## 2014-05-07 LAB — TROPONIN I

## 2014-05-07 MED ORDER — REGADENOSON 0.4 MG/5ML IV SOLN
0.4000 mg | Freq: Once | INTRAVENOUS | Status: AC
  Administered 2014-05-07: 0.4 mg via INTRAVENOUS
  Filled 2014-05-07: qty 5

## 2014-05-07 MED ORDER — ACETAMINOPHEN 325 MG PO TABS
ORAL_TABLET | ORAL | Status: AC
  Administered 2014-05-07: 13:00:00
  Filled 2014-05-07: qty 2

## 2014-05-07 MED ORDER — TECHNETIUM TC 99M SESTAMIBI GENERIC - CARDIOLITE
10.0000 | Freq: Once | INTRAVENOUS | Status: AC | PRN
  Administered 2014-05-07: 10 via INTRAVENOUS

## 2014-05-07 MED ORDER — REGADENOSON 0.4 MG/5ML IV SOLN
INTRAVENOUS | Status: AC
  Administered 2014-05-07: 0.4 mg via INTRAVENOUS
  Filled 2014-05-07: qty 5

## 2014-05-07 MED ORDER — TECHNETIUM TC 99M SESTAMIBI GENERIC - CARDIOLITE
30.0000 | Freq: Once | INTRAVENOUS | Status: AC | PRN
  Administered 2014-05-07: 30 via INTRAVENOUS

## 2014-05-07 NOTE — Progress Notes (Signed)
SUBJECTIVE:  Patient was admitted yesterday with a significant syncopal episode. Currently she is in nuclear medicine for a stress study. She will also have 2-D echo. Troponins so far are normal. Telemetry is showing sinus rhythm.   Filed Vitals:   05/06/14 1600 05/06/14 1630 05/06/14 2044 05/07/14 0428  BP: 122/70 122/62 106/53 120/60  Pulse: 74 62 68 67  Temp:  98.4 F (36.9 C) 98.9 F (37.2 C) 98.2 F (36.8 C)  TempSrc:   Oral Oral  Resp: 17 20 18 18   Weight: 149 lb 7.6 oz (67.8 kg)     SpO2: 99% 100% 98% 98%     Intake/Output Summary (Last 24 hours) at 05/07/14 0912 Last data filed at 05/06/14 2300  Gross per 24 hour  Intake    600 ml  Output    300 ml  Net    300 ml    LABS: Basic Metabolic Panel:  Recent Labs  16/12/9600/23/16 0955 05/07/14 0523  NA 142 137  K 4.0 4.0  CL 112 108  CO2 23 24  GLUCOSE 101* 106*  BUN 16 13  CREATININE 0.74 0.68  CALCIUM 8.6 9.0   Liver Function Tests:  Recent Labs  05/06/14 0955  AST 30  ALT 21  ALKPHOS 73  BILITOT 0.3  PROT 6.3  ALBUMIN 3.8   No results for input(s): LIPASE, AMYLASE in the last 72 hours. CBC:  Recent Labs  05/06/14 0955 05/07/14 0523  WBC 3.8* 4.4  NEUTROABS 2.2  --   HGB 12.2 11.9*  HCT 37.5 36.6  MCV 84.5 85.1  PLT 184 214   Cardiac Enzymes:  Recent Labs  05/06/14 1820 05/06/14 2153 05/07/14 0523  TROPONINI <0.03 <0.03 <0.03   BNP: Invalid input(s): POCBNP D-Dimer: No results for input(s): DDIMER in the last 72 hours. Hemoglobin A1C: No results for input(s): HGBA1C in the last 72 hours. Fasting Lipid Panel: No results for input(s): CHOL, HDL, LDLCALC, TRIG, CHOLHDL, LDLDIRECT in the last 72 hours. Thyroid Function Tests:  Recent Labs  05/06/14 1820  TSH 1.768    RADIOLOGY: Dg Chest 2 View  05/06/2014   CLINICAL DATA:  Syncope this morning.  EXAM: CHEST  2 VIEW  COMPARISON:  None.  FINDINGS: Lateral view degraded by patient arm position. Midline trachea. Normal heart  size. Mildly tortuous thoracic aorta. No pleural effusion or pneumothorax. Clear lungs.  IMPRESSION: No acute cardiopulmonary disease.   Electronically Signed   By: Jeronimo GreavesKyle  Talbot M.D.   On: 05/06/2014 11:28   Ct Cervical Spine Wo Contrast  05/06/2014   CLINICAL DATA:  Patient passed out with facial and neck pain  EXAM: CT MAXILLOFACIAL WITHOUT CONTRAST  CT CERVICAL SPINE WITHOUT CONTRAST  TECHNIQUE: Multidetector CT imaging of the cervical spine and maxillofacial structures were performed using the standard protocol without intravenous contrast. Multiplanar CT image reconstructions of the cervical spine and maxillofacial structures were also generated.  COMPARISON:  None.  FINDINGS: CT MAXILLOFACIAL FINDINGS  There is mild soft tissue swelling over the upper left face. There is no demonstrable fracture or dislocation. No intraorbital lesions are identified. There is mucosal thickening with a small air-fluid level in the right maxillary antrum. Other paranasal sinuses are clear. Ostiomeatal unit complexes are patent bilaterally. The nasal turbinates are prominent bilaterally without nares obstruction. The mastoid air cells bilaterally are clear. A portion of the right superior for smaller is missing. There is no appreciable adenopathy. Visualized brain parenchyma is unremarkable.  CT CERVICAL SPINE FINDINGS  There is no fracture or spondylolisthesis. Prevertebral soft tissues and predental space regions are normal. There is moderate disc space narrowing at C5-6 and C6-7. There are small anterior osteophytes at C5, C6, and C7. There is facet hypertrophy at several levels bilaterally. There is no nerve root edema or effacement. No disc extrusion or stenosis.  IMPRESSION: CT maxillofacial: No fracture or dislocation. Soft tissue swelling over upper left face. Right maxillary sinus disease. Nasal turbinate edema bilaterally. Ostiomeatal unit complexes are patent bilaterally. No intraorbital lesions. A portion of the  right superior first molar is missing.  CT cervical spine: Multilevel osteoarthritic change. No fracture or spondylolisthesis.   Electronically Signed   By: Bretta Bang III M.D.   On: 05/06/2014 11:18   Ct Maxillofacial Wo Cm  05/06/2014   CLINICAL DATA:  Patient passed out with facial and neck pain  EXAM: CT MAXILLOFACIAL WITHOUT CONTRAST  CT CERVICAL SPINE WITHOUT CONTRAST  TECHNIQUE: Multidetector CT imaging of the cervical spine and maxillofacial structures were performed using the standard protocol without intravenous contrast. Multiplanar CT image reconstructions of the cervical spine and maxillofacial structures were also generated.  COMPARISON:  None.  FINDINGS: CT MAXILLOFACIAL FINDINGS  There is mild soft tissue swelling over the upper left face. There is no demonstrable fracture or dislocation. No intraorbital lesions are identified. There is mucosal thickening with a small air-fluid level in the right maxillary antrum. Other paranasal sinuses are clear. Ostiomeatal unit complexes are patent bilaterally. The nasal turbinates are prominent bilaterally without nares obstruction. The mastoid air cells bilaterally are clear. A portion of the right superior for smaller is missing. There is no appreciable adenopathy. Visualized brain parenchyma is unremarkable.  CT CERVICAL SPINE FINDINGS  There is no fracture or spondylolisthesis. Prevertebral soft tissues and predental space regions are normal. There is moderate disc space narrowing at C5-6 and C6-7. There are small anterior osteophytes at C5, C6, and C7. There is facet hypertrophy at several levels bilaterally. There is no nerve root edema or effacement. No disc extrusion or stenosis.  IMPRESSION: CT maxillofacial: No fracture or dislocation. Soft tissue swelling over upper left face. Right maxillary sinus disease. Nasal turbinate edema bilaterally. Ostiomeatal unit complexes are patent bilaterally. No intraorbital lesions. A portion of the right  superior first molar is missing.  CT cervical spine: Multilevel osteoarthritic change. No fracture or spondylolisthesis.   Electronically Signed   By: Bretta Bang III M.D.   On: 05/06/2014 11:18    Patient is currently off the floor. I will see her when she returns.   TELEMETRY: I have reviewed telemetry today May 07, 2014. There is sinus rhythm.   ASSESSMENT AND PLAN:    Syncope     So far we do not have a definite etiology. Nuclear stress test and 2-D echo are to be done today. After that data is obtained,  further decisions will be made.    Facial trauma    No fractures.    Left arm pain     There has been mention of an x-ray of the arm. I do not see this resolved yet.   Willa Rough 05/07/2014 9:12 AM

## 2014-05-07 NOTE — Progress Notes (Signed)
Myoview and echo resulted noted, both normal except grade 2 diastolic dysfunction on echo. Low risk nuc. Will check with Dr. Myrtis SerKatz tomorrow AM regarding further plan  Signed, Azalee CourseHao Lilit Cinelli PA Pager: [phone]

## 2014-05-07 NOTE — Progress Notes (Signed)
Echocardiogram 2D Echocardiogram has been performed.  Evelyn Dillon 05/07/2014, 3:48 PM

## 2014-05-07 NOTE — Discharge Instructions (Signed)
**  PLEASE REMEMBER TO BRING ALL OF YOUR MEDICATIONS TO EACH OF YOUR FOLLOW-UP OFFICE VISITS.  No driving for six months.

## 2014-05-07 NOTE — Progress Notes (Signed)
UR completed 

## 2014-05-08 ENCOUNTER — Encounter (HOSPITAL_COMMUNITY): Payer: Self-pay | Admitting: Physician Assistant

## 2014-05-08 ENCOUNTER — Other Ambulatory Visit: Payer: Self-pay | Admitting: Physician Assistant

## 2014-05-08 DIAGNOSIS — R55 Syncope and collapse: Secondary | ICD-10-CM

## 2014-05-08 NOTE — Progress Notes (Signed)
financial assistance form for event monitor faxed successfully. Louie BunWilson,Imara Standiford S 11:51 AM

## 2014-05-08 NOTE — Discharge Summary (Signed)
Discharge Summary   Patient ID: Evelyn Dillon MRN: [id number], DOB/AGE: 55/19/1962 55 y.o. Admit date: 05/06/2014 D/C date:     05/08/2014  Primary Care Provider: User [employer]ric[employer], MD Primary Cardiologist: Jens Somrenshaw  Primary Discharge Diagnoses:  1. Syncope  Secondary Discharge Diagnoses:  1. H/o kidney infection  Hospital Course: Evelyn Dillon is a 54 y/o Spanish-speaking female with no significant medical history other than prior kidney infection who [facility]. She typically does not have dyspnea on exertion, orthopnea, PND, pedal edema and no history of syncope. Occasional brief palpitations. She has had several brief episodes of chest pain in the past year not related to exertion. She has not had any exertional chest pain. She was working today and developed brief pain (15 seconds) in the left chest area that radiated to her head. She felt hot/lightheaded and after several seconds had frank syncope. Duration of unconsciousness unclear. There were no preceding palpitations, nausea, diaphoresis or dyspnea. Note her pain was not pleuritic. No recent health issues, travel issues, medication issues, work changes. She was not tachypnic, tachycardic or hypoxic. There was no incontinence or seizure activity. She did strike her head resulting in trauma but not requiring any sutures. At time of cardiology evaluation she was complaining of left arm pain that self-resolved. Initial labwork unrevealing. UA was obtained with moderate Hgb on dipstick but confirmed RBCs wnl at 0-2 on further microscopic eval. Troponins remained negative. CT maxillofacial/C spine did not show acute injuries. Although initial H/P suggested films of the humerus would be obtained, this was deferred due to resolution of pain (and full ROM of extremity). Thyroid function normal. 2D Echo 05/07/14: EF 60-65%, no RWMA, grade 2 DD. Nuclear stress test 05/07/14 showing no reversible ischemia or infarction, normal  left ventricular wall motion, left ventricular ejection fraction 70%. Etiology of her syncope is not clear. Telemetry was unrevealing. The patient was advised that she cannot drive for 6 months. Her family reported this is not an issue because she does not have a license anyway. She was advised to remain out of work for 1 week and to notify office if any further symptoms. 30-day event monitor will be arranged - the patient does not have insurance so paperwork will be submitted for assistance. It will be faxed back to our office today and they will call her to arrange the monitor. Dr. Myrtis SerKatz has seen and examined the patient today and feels she is stable for discharge.  Appt previously made at time of mid-hospital discharge for 06/16/14 with Dr. Jens Somrenshaw.   Discharge Vitals: Blood pressure 105/64, pulse 63, temperature 98.2 F (36.8 C), temperature source Oral, resp. rate 18, weight 149 lb 7.6 oz (67.8 kg), SpO2 98 %.  Labs: Lab Results  Component Value Date   WBC 4.4 05/07/2014   HGB 11.9* 05/07/2014   HCT 36.6 05/07/2014   MCV 85.1 05/07/2014   PLT 214 05/07/2014    Recent Labs Lab 05/06/14 0955 05/07/14 0523  NA 142 137  K 4.0 4.0  CL 112 108  CO2 23 24  BUN 16 13  CREATININE 0.74 0.68  CALCIUM 8.6 9.0  PROT 6.3  --   BILITOT 0.3  --   ALKPHOS 73  --   ALT 21  --   AST 30  --   GLUCOSE 101* 106*    Recent Labs  05/06/14 0955 05/06/14 1820 05/06/14 2153 05/07/14 0523  TROPONINI <0.03 <0.03 <0.03 <0.03    Diagnostic Studies/Procedures  2D Echo 05/07/14 - Left ventricle: The cavity size was normal. Wall thickness was normal. Systolic function was normal. The estimated ejection fraction was in the range of 60% to 65%. Wall motion was normal; there were no regional wall motion abnormalities. Features are consistent with a pseudonormal left ventricular filling pattern, with concomitant abnormal relaxation and increased filling pressure (grade 2 diastolic  dysfunction).  Dg Chest 2 View  05/06/2014   CLINICAL DATA:  Syncope this morning.  EXAM: CHEST  2 VIEW  COMPARISON:  None.  FINDINGS: Lateral view degraded by patient arm position. Midline trachea. Normal heart size. Mildly tortuous thoracic aorta. No pleural effusion or pneumothorax. Clear lungs.  IMPRESSION: No acute cardiopulmonary disease.   Electronically Signed   By: Jeronimo Greaves M.D.   On: 05/06/2014 11:28   Ct Cervical Spine Wo Contrast  05/06/2014   CLINICAL DATA:  Patient passed out with facial and neck pain  EXAM: CT MAXILLOFACIAL WITHOUT CONTRAST  CT CERVICAL SPINE WITHOUT CONTRAST  TECHNIQUE: Multidetector CT imaging of the cervical spine and maxillofacial structures were performed using the standard protocol without intravenous contrast. Multiplanar CT image reconstructions of the cervical spine and maxillofacial structures were also generated.  COMPARISON:  None.  FINDINGS: CT MAXILLOFACIAL FINDINGS  There is mild soft tissue swelling over the upper left face. There is no demonstrable fracture or dislocation. No intraorbital lesions are identified. There is mucosal thickening with a small air-fluid level in the right maxillary antrum. Other paranasal sinuses are clear. Ostiomeatal unit complexes are patent bilaterally. The nasal turbinates are prominent bilaterally without nares obstruction. The mastoid air cells bilaterally are clear. A portion of the right superior for smaller is missing. There is no appreciable adenopathy. Visualized brain parenchyma is unremarkable.  CT CERVICAL SPINE FINDINGS  There is no fracture or spondylolisthesis. Prevertebral soft tissues and predental space regions are normal. There is moderate disc space narrowing at C5-6 and C6-7. There are small anterior osteophytes at C5, C6, and C7. There is facet hypertrophy at several levels bilaterally. There is no nerve root edema or effacement. No disc extrusion or stenosis.  IMPRESSION: CT maxillofacial: No fracture or  dislocation. Soft tissue swelling over upper left face. Right maxillary sinus disease. Nasal turbinate edema bilaterally. Ostiomeatal unit complexes are patent bilaterally. No intraorbital lesions. A portion of the right superior first molar is missing.  CT cervical spine: Multilevel osteoarthritic change. No fracture or spondylolisthesis.   Electronically Signed   By: Bretta Bang III M.D.   On: 05/06/2014 11:18   Nm Myocar Multi W/spect W/wall Motion / Ef  05/07/2014   CLINICAL DATA:  54 year old female with no prior cardiac history for evaluation of syncope  EXAM: MYOCARDIAL IMAGING WITH SPECT (REST AND PHARMACOLOGIC-STRESS)  GATED LEFT VENTRICULAR WALL MOTION STUDY  LEFT VENTRICULAR EJECTION FRACTION  TECHNIQUE: Standard myocardial SPECT imaging was performed after resting intravenous injection of 10 mCi Tc-30m sestamibi. Subsequently, intravenous infusion of Lexiscan was performed under the supervision of the Cardiology staff. At peak effect of the drug, 30 mCi Tc-67m sestamibi was injected intravenously and standard myocardial SPECT imaging was performed. Quantitative gated imaging was also performed to evaluate left ventricular wall motion, and estimate left ventricular ejection fraction.  COMPARISON:  None.  FINDINGS: Perfusion: No decreased activity in the left ventricle on stress imaging to suggest reversible ischemia or infarction.  Wall Motion: Normal left ventricular wall motion. No left ventricular dilation.  Left Ventricular Ejection Fraction: 70 %  End diastolic volume 64 ml  End systolic volume 19 ml  IMPRESSION: 1. No reversible ischemia or infarction.  2. Normal left ventricular wall motion.  3. Left ventricular ejection fraction 70%  4. Low-risk stress test findings*.  *2012 Appropriate Use Criteria for Coronary Revascularization Focused Update: J Am Coll Cardiol. 2012;59(9):857-881. http://content.dementiazones.com.aspx?articleid=1201161   Electronically Signed   By: Thurmon Fair    On: 05/07/2014 13:45   Ct Maxillofacial Wo Cm  05/06/2014   CLINICAL DATA:  Patient passed out with facial and neck pain  EXAM: CT MAXILLOFACIAL WITHOUT CONTRAST  CT CERVICAL SPINE WITHOUT CONTRAST  TECHNIQUE: Multidetector CT imaging of the cervical spine and maxillofacial structures were performed using the standard protocol without intravenous contrast. Multiplanar CT image reconstructions of the cervical spine and maxillofacial structures were also generated.  COMPARISON:  None.  FINDINGS: CT MAXILLOFACIAL FINDINGS  There is mild soft tissue swelling over the upper left face. There is no demonstrable fracture or dislocation. No intraorbital lesions are identified. There is mucosal thickening with a small air-fluid level in the right maxillary antrum. Other paranasal sinuses are clear. Ostiomeatal unit complexes are patent bilaterally. The nasal turbinates are prominent bilaterally without nares obstruction. The mastoid air cells bilaterally are clear. A portion of the right superior for smaller is missing. There is no appreciable adenopathy. Visualized brain parenchyma is unremarkable.  CT CERVICAL SPINE FINDINGS  There is no fracture or spondylolisthesis. Prevertebral soft tissues and predental space regions are normal. There is moderate disc space narrowing at C5-6 and C6-7. There are small anterior osteophytes at C5, C6, and C7. There is facet hypertrophy at several levels bilaterally. There is no nerve root edema or effacement. No disc extrusion or stenosis.  IMPRESSION: CT maxillofacial: No fracture or dislocation. Soft tissue swelling over upper left face. Right maxillary sinus disease. Nasal turbinate edema bilaterally. Ostiomeatal unit complexes are patent bilaterally. No intraorbital lesions. A portion of the right superior first molar is missing.  CT cervical spine: Multilevel osteoarthritic change. No fracture or spondylolisthesis.   Electronically Signed   By: Bretta Bang III M.D.   On:  05/06/2014 11:18    Discharge Medications  There are no discharge medications for this patient.   Disposition   The patient will be discharged in stable condition to home. Discharge Instructions    Increase activity slowly    Complete by:  As directed   No driving for 6 months. If you have any recurrent episodes or symptoms, please seek medical attention immediately.  You may return to work in 1 week (on 05/15/14). These instructions were reviewed verbally with the patient with Spanish interpreter.          Follow-up Information    Follow up with Olga Millers, MD On 06/16/2014.   Specialty:  Cardiology   Why:  11:30 - This is the [facility] location.   Contact information:   [address] [place] [address] [phone]       Follow up with [facility] [address].   Specialty:  Cardiology   Why:  Office will contact you to arrange heart monitor.   Contact information:   [address], [address] [place] [address] [phone]        Duration of Discharge Encounter: Greater than 30 minutes including physician and PA time.  Signed, Ronie Spies PA-C 05/08/2014, 8:57 AM Patient seen and examined. I agree with the assessment and plan as detailed above. See also my additional thoughts below.   Please refer to  the progress note from today also. I made the decision for the patient to go home. We have very carefully made follow-up arrangements.  Willa Rough, MD, [facility] 05/08/2014 1:37 PM

## 2014-05-08 NOTE — Progress Notes (Signed)
Pt discharged home with husband Discharge instructions given & reviewed with spanish interpretor  Education discussed  IV dc'd  Tele dc'd  Pt discharged via wheelchair with volunteer services All patient belongs at side.   Darrel HooverWilson,Vista Sawatzky S 1:43 PM

## 2014-05-08 NOTE — Progress Notes (Signed)
SUBJECTIVE:  Patient is stable. Telemetry shows normal sinus rhythm. Echocardiogram done yesterday shows normal left ventricular and right ventricular function. Nuclear stress study shows no evidence of ischemia. The patient is stable. She does not speak any English and the family member in the room does not speak [place]. She is stable to go home. There has been a question of whether there is an x-ray of her arm that I cannot find. This issue will be reviewed. We will communicate with the members of her family did speak English and make plans for discharge home. We will need to try to arrange for an event recorder if this can be arranged.   Filed Vitals:   05/07/14 1116 05/07/14 1318 05/07/14 2125 05/08/14 0417  BP: 118/72 108/66 117/67 105/64  Pulse: 55 60 66 63  Temp: 98 F (36.7 C) 98.4 F (36.9 C) 99 F (37.2 C) 98.2 F (36.8 C)  TempSrc: Oral Oral Oral Oral  Resp: Weight:      SpO2:  99% 96% 98%     Intake/Output Summary (Last 24 hours) at 05/08/14 0807 Last data filed at 05/07/14 2127  Gross per 24 hour  Intake    720 ml  Output    900 ml  Net   -180 ml    LABS: Basic Metabolic Panel:  Recent Labs  16/10/96 0955 05/07/14 0523  NA 142 137  K 4.0 4.0  CL 112 108  CO2 23 24  GLUCOSE 101* 106*  BUN 16 13  CREATININE 0.74 0.68  CALCIUM 8.6 9.0   Liver Function Tests:  Recent Labs  05/06/14 0955  AST 30  ALT 21  ALKPHOS 73  BILITOT 0.3  PROT 6.3  ALBUMIN 3.8   No results for input(s): LIPASE, AMYLASE in the last 72 hours. CBC:  Recent Labs  05/06/14 0955 05/07/14 0523  WBC 3.8* 4.4  NEUTROABS 2.2  --   HGB 12.2 11.9*  HCT 37.5 36.6  MCV 84.5 85.1  PLT 184 214   Cardiac Enzymes:  Recent Labs  05/06/14 1820 05/06/14 2153 05/07/14 0523  TROPONINI <0.03 <0.03 <0.03   BNP: Invalid input(s): POCBNP D-Dimer: No results for input(s): DDIMER in the last 72 hours. Hemoglobin A1C: No results for input(s): HGBA1C in the last 72  hours. Fasting Lipid Panel: No results for input(s): CHOL, HDL, LDLCALC, TRIG, CHOLHDL, LDLDIRECT in the last 72 hours. Thyroid Function Tests:  Recent Labs  05/06/14 1820  TSH 1.768    RADIOLOGY: Dg Chest 2 View  05/06/2014   CLINICAL DATA:  Syncope this morning.  EXAM: CHEST  2 VIEW  COMPARISON:  None.  FINDINGS: Lateral view degraded by patient arm position. Midline trachea. Normal heart size. Mildly tortuous thoracic aorta. No pleural effusion or pneumothorax. Clear lungs.  IMPRESSION: No acute cardiopulmonary disease.   Electronically Signed   By: Jeronimo Greaves M.D.   On: 05/06/2014 11:28   Ct Cervical Spine Wo Contrast  05/06/2014   CLINICAL DATA:  Patient passed out with facial and neck pain  EXAM: CT MAXILLOFACIAL WITHOUT CONTRAST  CT CERVICAL SPINE WITHOUT CONTRAST  TECHNIQUE: Multidetector CT imaging of the cervical spine and maxillofacial structures were performed using the standard protocol without intravenous contrast. Multiplanar CT image reconstructions of the cervical spine and maxillofacial structures were also generated.  COMPARISON:  None.  FINDINGS: CT MAXILLOFACIAL FINDINGS  There is mild soft tissue swelling over the upper left face. There is no demonstrable fracture or  dislocation. No intraorbital lesions are identified. There is mucosal thickening with a small air-fluid level in the right maxillary antrum. Other paranasal sinuses are clear. Ostiomeatal unit complexes are patent bilaterally. The nasal turbinates are prominent bilaterally without nares obstruction. The mastoid air cells bilaterally are clear. A portion of the right superior for smaller is missing. There is no appreciable adenopathy. Visualized brain parenchyma is unremarkable.  CT CERVICAL SPINE FINDINGS  There is no fracture or spondylolisthesis. Prevertebral soft tissues and predental space regions are normal. There is moderate disc space narrowing at C5-6 and C6-7. There are small anterior osteophytes at C5,  C6, and C7. There is facet hypertrophy at several levels bilaterally. There is no nerve root edema or effacement. No disc extrusion or stenosis.  IMPRESSION: CT maxillofacial: No fracture or dislocation. Soft tissue swelling over upper left face. Right maxillary sinus disease. Nasal turbinate edema bilaterally. Ostiomeatal unit complexes are patent bilaterally. No intraorbital lesions. A portion of the right superior first molar is missing.  CT cervical spine: Multilevel osteoarthritic change. No fracture or spondylolisthesis.   Electronically Signed   By: Bretta Bang III M.D.   On: 05/06/2014 11:18   Nm Myocar Multi W/spect W/wall Motion / Ef  05/07/2014   CLINICAL DATA:  54 year old female with no prior cardiac history for evaluation of syncope  EXAM: MYOCARDIAL IMAGING WITH SPECT (REST AND PHARMACOLOGIC-STRESS)  GATED LEFT VENTRICULAR WALL MOTION STUDY  LEFT VENTRICULAR EJECTION FRACTION  TECHNIQUE: Standard myocardial SPECT imaging was performed after resting intravenous injection of 10 mCi Tc-26m sestamibi. Subsequently, intravenous infusion of Lexiscan was performed under the supervision of the Cardiology staff. At peak effect of the drug, 30 mCi Tc-58m sestamibi was injected intravenously and standard myocardial SPECT imaging was performed. Quantitative gated imaging was also performed to evaluate left ventricular wall motion, and estimate left ventricular ejection fraction.  COMPARISON:  None.  FINDINGS: Perfusion: No decreased activity in the left ventricle on stress imaging to suggest reversible ischemia or infarction.  Wall Motion: Normal left ventricular wall motion. No left ventricular dilation.  Left Ventricular Ejection Fraction: 70 %  End diastolic volume 64 ml  End systolic volume 19 ml  IMPRESSION: 1. No reversible ischemia or infarction.  2. Normal left ventricular wall motion.  3. Left ventricular ejection fraction 70%  4. Low-risk stress test findings*.  *2012 Appropriate Use Criteria  for Coronary Revascularization Focused Update: J Am Coll Cardiol. 2012;59(9):857-881. http://content.dementiazones.com.aspx?articleid=1201161   Electronically Signed   By: Thurmon Fair   On: 05/07/2014 13:45   Ct Maxillofacial Wo Cm  05/06/2014   CLINICAL DATA:  Patient passed out with facial and neck pain  EXAM: CT MAXILLOFACIAL WITHOUT CONTRAST  CT CERVICAL SPINE WITHOUT CONTRAST  TECHNIQUE: Multidetector CT imaging of the cervical spine and maxillofacial structures were performed using the standard protocol without intravenous contrast. Multiplanar CT image reconstructions of the cervical spine and maxillofacial structures were also generated.  COMPARISON:  None.  FINDINGS: CT MAXILLOFACIAL FINDINGS  There is mild soft tissue swelling over the upper left face. There is no demonstrable fracture or dislocation. No intraorbital lesions are identified. There is mucosal thickening with a small air-fluid level in the right maxillary antrum. Other paranasal sinuses are clear. Ostiomeatal unit complexes are patent bilaterally. The nasal turbinates are prominent bilaterally without nares obstruction. The mastoid air cells bilaterally are clear. A portion of the right superior for smaller is missing. There is no appreciable adenopathy. Visualized brain parenchyma is unremarkable.  CT CERVICAL SPINE FINDINGS  There is no fracture or spondylolisthesis. Prevertebral soft tissues and predental space regions are normal. There is moderate disc space narrowing at C5-6 and C6-7. There are small anterior osteophytes at C5, C6, and C7. There is facet hypertrophy at several levels bilaterally. There is no nerve root edema or effacement. No disc extrusion or stenosis.  IMPRESSION: CT maxillofacial: No fracture or dislocation. Soft tissue swelling over upper left face. Right maxillary sinus disease. Nasal turbinate edema bilaterally. Ostiomeatal unit complexes are patent bilaterally. No intraorbital lesions. A portion of the  right superior first molar is missing.  CT cervical spine: Multilevel osteoarthritic change. No fracture or spondylolisthesis.   Electronically Signed   By: Bretta BangWilliam  Woodruff III M.D.   On: 05/06/2014 11:18    PHYSICAL EXAM  patient speaks no English. Cardiac exam reveals an S1 and S2. Lungs are clear.   TELEMETRY: I have personally reviewed telemetry today May 08, 2014. There is normal sinus rhythm.   ASSESSMENT AND PLAN:    Syncope    At this point no obvious cause for her syncope has been found. She has normal left ventricular function and no sign of ischemia. Telemetry has shown no arrhythmias. She has artery been instructed that she cannot drive for 6 months. This will be communicated again to her family. It will be helpful for her to wear a 21 day event recorder if this can be arranged. She is ready to be discharged from the cardiac viewpoint today.    Facial trauma     There were no fractures.    Left arm pain     When we can speak with interpreter today, we will find out more about her arm pain. Also we will be sure that an x-ray has been done if this is appropriate.   Willa RoughJeffrey Katz 05/08/2014 8:07 AM

## 2014-06-11 NOTE — Progress Notes (Signed)
      HPI: follow-up syncope. Recently admitted with syncope of uncertain etiology. Echocardiogram February 2016 showed normal LV function and grade 2 diastolic dysfunction. Nuclear study February 2016 showed ejection fraction 70% and no ischemia or infarction. Telemetry unremarkable. Sent home with outpatient monitor. Since discharge, she denies dyspnea or recurrent syncope. No palpitations. She occasionally has chest pain associated with work. The duration is 1-2 days by her report. Note she does not speak [place] and her history is obtained with the assistance of an interpreter.  No current outpatient prescriptions on file.   No current facility-administered medications for this visit.     Past Medical History  Diagnosis Date  . Kidney infection   . Syncope     a. 04/2014: tele unrevealing, echo with normal EF & grade 2 DD, normal nuc. Etiology not clear.    Past Surgical History  Procedure Laterality Date  . No prior surgery      History   Social History  . Marital Status: Married    Spouse Name: N/A  . Number of Children: 7  . Years of Education: N/A   Occupational History  . Not on file.   Social History Main Topics  . Smoking status: Never Smoker   . Smokeless tobacco: Not on file  . Alcohol Use: No  . Drug Use: No  . Sexual Activity: Yes   Other Topics Concern  . Not on file   Social History Narrative    ROS: no fevers or chills, productive cough, hemoptysis, dysphasia, odynophagia, melena, hematochezia, dysuria, hematuria, rash, seizure activity, orthopnea, PND, pedal edema, claudication. Remaining systems are negative.  Physical Exam: Well-developed well-nourished in no acute distress.  Skin is warm and dry.  HEENT is normal.  Neck is supple.  Chest is clear to auscultation with normal expansion.  Cardiovascular exam is regular rate and rhythm.  Abdominal exam nontender or distended. No masses palpated. Extremities show no edema. neuro grossly  intact

## 2014-06-16 ENCOUNTER — Encounter: Payer: Self-pay | Admitting: Cardiology

## 2014-06-16 ENCOUNTER — Ambulatory Visit (INDEPENDENT_AMBULATORY_CARE_PROVIDER_SITE_OTHER): Payer: Self-pay | Admitting: Cardiology

## 2014-06-16 VITALS — BP 140/84 | HR 62 | Wt 156.0 lb

## 2014-06-16 DIAGNOSIS — R55 Syncope and collapse: Secondary | ICD-10-CM

## 2014-06-16 NOTE — Assessment & Plan Note (Signed)
Previous syncopal episode of uncertain etiology.echocardiogram showed normal LV function and nuclear study showed no ischemia. Plan 30 day monitor. Patient instructed not to drive for 6 months. If recurrent episodes in the future outside of the timeframe of her monitor we will consider an implantable loop.

## 2014-06-16 NOTE — Patient Instructions (Signed)
Your physician wants you to follow-up in: 3 MONTHS WITH DR Jens SomRENSHAW You will receive a reminder letter in the mail two months in advance. If you don't receive a letter, please call our office to schedule the follow-up appointment  Your physician has recommended that you wear an event monitor. Event monitors are medical devices that record the heart's electrical activity. Doctors most often us these monitors to diagnose arrhythmias. Arrhythmias are problems with the speed or rhythm of the heartbeat. The monitor is a small, portable device. You can wear one while you do your normal daily activities. This is usually used to diagnose what is causing palpitations/syncope (passing out).[facility]

## 2014-06-19 ENCOUNTER — Encounter: Payer: Self-pay | Admitting: *Deleted

## 2014-06-19 DIAGNOSIS — R55 Syncope and collapse: Secondary | ICD-10-CM

## 2014-06-19 NOTE — Progress Notes (Signed)
Patient ID: Evelyn Dillon, female   DOB: 01/15/1961, 54 y.o.   MRN: [id number] Patient given application for financial hardship from [employer].  Patient instructed through interpretor to fill out requested information and return application to our office.  A 30 day cardiac event monitor will be mailed to her home upon approval of hardship. Patient was given application for financial hardship from [facility].  Patient instructed to return complete form to address listed on the last page of the form.

## 2014-07-10 ENCOUNTER — Encounter: Payer: Self-pay | Admitting: *Deleted

## 2014-07-10 NOTE — Progress Notes (Signed)
Patient ID: Evelyn Dillon, female   DOB: November 30, 1960, 54 y.o.   MRN: [id number] Patient enrolled with [employer] for a 30 day cardiac event monitor pending hardship application.  Appointment will need to be made for application of monitor in our office once her hardship application is approved.

## 2014-07-23 ENCOUNTER — Telehealth: Payer: Self-pay | Admitting: Cardiology

## 2014-07-23 NOTE — Telephone Encounter (Signed)
[employer] was calling to say they are deactivating the patient. Unable to reach her or leave a voicemail after multiple tries.

## 2014-07-23 NOTE — Telephone Encounter (Signed)
[employer] calling about this pt.

## 2015-01-03 ENCOUNTER — Emergency Department (HOSPITAL_COMMUNITY): Payer: Self-pay

## 2015-01-03 ENCOUNTER — Inpatient Hospital Stay (HOSPITAL_COMMUNITY)
Admission: EM | Admit: 2015-01-03 | Discharge: 2015-01-06 | DRG: 440 | Disposition: A | Discharge status: Home / Self Care | Payer: Self-pay | Attending: Family Medicine | Admitting: Family Medicine

## 2015-01-03 ENCOUNTER — Encounter (HOSPITAL_COMMUNITY): Payer: Self-pay | Admitting: Emergency Medicine

## 2015-01-03 DIAGNOSIS — E785 Hyperlipidemia, unspecified: Secondary | ICD-10-CM | POA: Diagnosis present

## 2015-01-03 DIAGNOSIS — E876 Hypokalemia: Secondary | ICD-10-CM | POA: Insufficient documentation

## 2015-01-03 DIAGNOSIS — K859 Acute pancreatitis without necrosis or infection, unspecified: Principal | ICD-10-CM | POA: Diagnosis present

## 2015-01-03 DIAGNOSIS — R1013 Epigastric pain: Secondary | ICD-10-CM | POA: Insufficient documentation

## 2015-01-03 DIAGNOSIS — K269 Duodenal ulcer, unspecified as acute or chronic, without hemorrhage or perforation: Secondary | ICD-10-CM | POA: Diagnosis present

## 2015-01-03 DIAGNOSIS — R1010 Upper abdominal pain, unspecified: Secondary | ICD-10-CM | POA: Insufficient documentation

## 2015-01-03 LAB — CBC
HCT: 40 % (ref 36.0–46.0)
Hemoglobin: 13.3 g/dL (ref 12.0–15.0)
MCH: 28.3 pg (ref 26.0–34.0)
MCHC: 33.3 g/dL (ref 30.0–36.0)
MCV: 85.1 fL (ref 78.0–100.0)
PLATELETS: 263 10*3/uL (ref 150–400)
RBC: 4.7 MIL/uL (ref 3.87–5.11)
RDW: 13.2 % (ref 11.5–15.5)
WBC: 7.9 10*3/uL (ref 4.0–10.5)

## 2015-01-03 LAB — COMPREHENSIVE METABOLIC PANEL
ALK PHOS: 88 U/L (ref 38–126)
ALT: 34 U/L (ref 14–54)
AST: 36 U/L (ref 15–41)
Albumin: 3.9 g/dL (ref 3.5–5.0)
Anion gap: 5 (ref 5–15)
BILIRUBIN TOTAL: 0.4 mg/dL (ref 0.3–1.2)
BUN: 13 mg/dL (ref 6–20)
CALCIUM: 8.9 mg/dL (ref 8.9–10.3)
CHLORIDE: 108 mmol/L (ref 101–111)
CO2: 25 mmol/L (ref 22–32)
CREATININE: 0.97 mg/dL (ref 0.44–1.00)
Glucose, Bld: 120 mg/dL — ABNORMAL HIGH (ref 65–99)
Potassium: 3.4 mmol/L — ABNORMAL LOW (ref 3.5–5.1)
Sodium: 138 mmol/L (ref 135–145)
TOTAL PROTEIN: 7 g/dL (ref 6.5–8.1)

## 2015-01-03 LAB — URINE MICROSCOPIC-ADD ON

## 2015-01-03 LAB — URINALYSIS, ROUTINE W REFLEX MICROSCOPIC
BILIRUBIN URINE: NEGATIVE
Glucose, UA: NEGATIVE mg/dL
KETONES UR: NEGATIVE mg/dL
Leukocytes, UA: NEGATIVE
NITRITE: NEGATIVE
PROTEIN: NEGATIVE mg/dL
Specific Gravity, Urine: 1.018 (ref 1.005–1.030)
Urobilinogen, UA: 0.2 mg/dL (ref 0.0–1.0)
pH: 5 (ref 5.0–8.0)

## 2015-01-03 LAB — POC OCCULT BLOOD, ED: FECAL OCCULT BLD: NEGATIVE

## 2015-01-03 LAB — I-STAT TROPONIN, ED: Troponin i, poc: 0 ng/mL (ref 0.00–0.08)

## 2015-01-03 MED ORDER — FAMOTIDINE IN NACL 20-0.9 MG/50ML-% IV SOLN
20.0000 mg | Freq: Two times a day (BID) | INTRAVENOUS | Status: DC
  Administered 2015-01-03: 20 mg via INTRAVENOUS
  Filled 2015-01-03 (×2): qty 50

## 2015-01-03 MED ORDER — MORPHINE SULFATE (PF) 4 MG/ML IV SOLN
4.0000 mg | Freq: Once | INTRAVENOUS | Status: AC
  Administered 2015-01-03: 4 mg via INTRAVENOUS
  Filled 2015-01-03: qty 1

## 2015-01-03 MED ORDER — ONDANSETRON HCL 4 MG/2ML IJ SOLN
4.0000 mg | Freq: Once | INTRAMUSCULAR | Status: AC
  Administered 2015-01-03: 4 mg via INTRAVENOUS
  Filled 2015-01-03: qty 2

## 2015-01-03 MED ORDER — GI COCKTAIL ~~LOC~~
30.0000 mL | Freq: Once | ORAL | Status: AC
  Administered 2015-01-03: 30 mL via ORAL
  Filled 2015-01-03: qty 30

## 2015-01-03 MED ORDER — SODIUM CHLORIDE 0.9 % IV BOLUS (SEPSIS)
1000.0000 mL | Freq: Once | INTRAVENOUS | Status: AC
Start: 2015-01-03 — End: 2015-01-03
  Administered 2015-01-03: 1000 mL via INTRAVENOUS

## 2015-01-03 NOTE — ED Provider Notes (Signed)
CSN: [id number]     Arrival date & time 01/03/15  2100 History   First MD Initiated Contact with Patient 01/03/15 2128     Chief Complaint  Patient presents with  . Abdominal Pain     (Consider location/radiation/quality/duration/timing/severity/associated sxs/prior Treatment) Patient is a 54 y.o. female presenting with abdominal pain. The history is provided by the patient, the spouse and a relative. The history is limited by the condition of the patient.  Abdominal Pain Pain location:  LUQ, RUQ and epigastric Pain quality: burning and sharp   Pain radiates to:  Epigastric region and back Pain severity:  Severe Onset quality:  Gradual Duration:  4 days Timing:  Constant Progression:  Worsening Chronicity:  Recurrent Context: eating   Context: not alcohol use, not medication withdrawal, not previous surgeries, not recent illness, not sick contacts, not suspicious food intake and not trauma   Relieved by:  None tried Worsened by:  Eating, palpation and movement Ineffective treatments:  None tried Associated symptoms: anorexia, diarrhea and nausea   Associated symptoms: no chest pain, no constipation, no dysuria, no fever and no shortness of breath   Risk factors: obesity   Risk factors: no alcohol abuse, has not had multiple surgeries and no NSAID use     Past Medical History  Diagnosis Date  . Kidney infection   . Syncope     a. 04/2014: tele unrevealing, echo with normal EF & grade 2 DD, normal nuc. Etiology not clear.   Past Surgical History  Procedure Laterality Date  . No prior surgery     Family History  Problem Relation Age of Onset  . Diabetes Mellitus I Sister   . Heart disease Sister     Died at 98   Social History  Substance Use Topics  . Smoking status: Never Smoker   . Smokeless tobacco: None  . Alcohol Use: No   OB History    No data available     Review of Systems  Constitutional: Negative for fever.  HENT: Negative for facial swelling.    Respiratory: Negative for shortness of breath.   Cardiovascular: Negative for chest pain.  Gastrointestinal: Positive for nausea, abdominal pain, diarrhea and anorexia. Negative for constipation and blood in stool.  Genitourinary: Negative for dysuria.  Musculoskeletal: Negative for back pain.  Skin: Negative for rash.  Neurological: Negative for headaches.  Psychiatric/Behavioral: Negative for confusion.      Allergies  Review of patient's allergies indicates no known allergies.  Home Medications   Prior to Admission medications   Not on File   BP 110/77 mmHg  Pulse 61  Temp(Src) 97.6 F (36.4 C) (Oral)  Resp 22  SpO2 100% Physical Exam  Constitutional: She is oriented to person, place, and time. She appears well-developed and well-nourished. She appears distressed.  HENT:  Head: Normocephalic and atraumatic.  Right Ear: External ear normal.  Left Ear: External ear normal.  Nose: Nose normal.  Mouth/Throat: Oropharynx is clear and moist. No oropharyngeal exudate.  Eyes: Conjunctivae and EOM are normal. Pupils are equal, round, and reactive to light. Right eye exhibits no discharge. Left eye exhibits no discharge. No scleral icterus.  Neck: Normal range of motion. Neck supple. No JVD present. No tracheal deviation present. No thyromegaly present.  Cardiovascular: Normal rate, regular rhythm and intact distal pulses.   Pulmonary/Chest: Effort normal and breath sounds normal. No stridor. No respiratory distress. She has no wheezes. She has no rales. She exhibits no tenderness.  Abdominal: Soft. She  exhibits no distension. There is tenderness in the right upper quadrant, epigastric area and left upper quadrant. There is positive Murphy's sign. There is no rigidity, no CVA tenderness and no tenderness at McBurney's point.  Musculoskeletal: Normal range of motion. She exhibits no edema or tenderness.  Lymphadenopathy:    She has no cervical adenopathy.  Neurological: She is  alert and oriented to person, place, and time.  Skin: Skin is warm and dry. No rash noted. She is not diaphoretic. No erythema. No pallor.  Psychiatric: She has a normal mood and affect. Her behavior is normal. Judgment and thought content normal.  Nursing note and vitals reviewed.   ED Course  Procedures (including critical care time) Labs Review Labs Reviewed  LIPASE, BLOOD - Abnormal; Notable for the following:    Lipase >3000 (*)    All other components within normal limits  COMPREHENSIVE METABOLIC PANEL - Abnormal; Notable for the following:    Potassium 3.4 (*)    Glucose, Bld 120 (*)    All other components within normal limits  URINALYSIS, ROUTINE W REFLEX MICROSCOPIC (NOT AT [facility]) - Abnormal; Notable for the following:    APPearance CLOUDY (*)    Hgb urine dipstick TRACE (*)    All other components within normal limits  URINE MICROSCOPIC-ADD ON - Abnormal; Notable for the following:    Squamous Epithelial / LPF FEW (*)    Bacteria, UA FEW (*)    All other components within normal limits  CBC - Abnormal; Notable for the following:    Hemoglobin 11.2 (*)    HCT 35.3 (*)    All other components within normal limits  BASIC METABOLIC PANEL - Abnormal; Notable for the following:    Glucose, Bld 138 (*)    Calcium 7.9 (*)    All other components within normal limits  LIPID PANEL - Abnormal; Notable for the following:    HDL 40 (*)    LDL Cholesterol 144 (*)    All other components within normal limits  CBC  MAGNESIUM  PHOSPHORUS  I-STAT TROPOININ, ED  POC OCCULT BLOOD, ED    Imaging Review Ct Abdomen Pelvis W Contrast  01/04/2015  CLINICAL DATA:  Acute onset of periumbilical abdominal pain, radiating to the back. Nausea and vomiting. Initial encounter. EXAM: CT ABDOMEN AND PELVIS WITH CONTRAST TECHNIQUE: Multidetector CT imaging of the abdomen and pelvis was performed using the standard protocol following bolus administration of intravenous contrast. CONTRAST:  100mL  OMNIPAQUE IOHEXOL 300 MG/ML  SOLN COMPARISON:  None. FINDINGS: The visualized lung bases are clear. Soft tissue inflammation and edema are noted about the pancreatic head and body, and about the duodenum, with significant fluid tracking inferiorly along the duodenum and right kidney. This may reflect acute pancreatitis, though given that the process appears more prominent about the duodenum, an underlying duodenal ulceration could have a similar appearance. There is no evidence of pancreatic devascularization or pseudocyst formation. The liver and spleen are unremarkable in appearance. The gallbladder is within normal limits. The pancreas and adrenal glands are unremarkable. The kidneys are unremarkable in appearance. There is no evidence of hydronephrosis. No renal or ureteral stones are seen. No perinephric stranding is appreciated. No free fluid is identified. The small bowel is unremarkable in appearance. The stomach is within normal limits. No acute vascular abnormalities are seen. The appendix is normal in caliber, without evidence for appendicitis. Minimal diverticulosis is noted along the sigmoid colon, without evidence of diverticulitis. The bladder is mildly distended  and grossly unremarkable. The uterus is unremarkable in appearance. The ovaries are grossly symmetric. No suspicious adnexal masses are seen. No inguinal lymphadenopathy is seen. No acute osseous abnormalities are identified. IMPRESSION: 1. Soft tissue inflammation and edema about the pancreatic head and body, and about the duodenum, with significant fluid tracking inferiorly along the duodenum and right kidney. This may reflect acute pancreatitis, though given that the process appears more prominent about the duodenum, an underlying duodenal ulceration could have a similar appearance. No evidence of pancreatic devascularization or pseudocyst formation. 2. Minimal diverticulosis along the sigmoid colon, without evidence of diverticulitis.  Electronically Signed   By: Roanna Raider M.D.   On: 01/04/2015 00:38   Dg Chest Portable 1 View  01/03/2015  CLINICAL DATA:  Initial valuation for 3 day history of acute upper abdominal pain. EXAM: PORTABLE CHEST 1 VIEW COMPARISON:  Prior radiograph from 05/06/2014. FINDINGS: The cardiac and mediastinal silhouettes are stable in size and contour, and remain within normal limits. The lungs are normally inflated. Minimal left basilar atelectasis. No airspace consolidation, pleural effusion, or pulmonary edema is identified. There is no pneumothorax. No acute osseous abnormality identified. IMPRESSION: Mild left basilar subsegmental atelectasis. No other active cardiopulmonary disease. Electronically Signed   By: Rise Mu M.D.   On: 01/03/2015 22:19   I have personally reviewed and evaluated these images and lab results as part of my medical decision-making.   EKG Interpretation   Date/Time:  Saturday January 03 2015 22:27:11 EDT Ventricular Rate:  54 PR Interval:  141 QRS Duration: 94 QT Interval:  461 QTC Calculation: 437 R Axis:   46 Text Interpretation:  Sinus rhythm Abnormal R-wave progression, early  transition ED PHYSICIAN INTERPRETATION AVAILABLE IN [facility]  Confirmed by TEST, Record ([id number]) on 01/04/2015 8:15:31 AM      MDM   Final diagnoses:  Upper abdominal pain   Pt with worsening upper abd pain over 4 days.  Acutely worsened following a spicy meal this evening.  Pt has TTP in upper abdomen.  Possibly GB, vs PUD, vs pancreatitis, ACS less likely.  Pt given IVF, antiemetic and pain medications with some improvement.  No significant findings in RUQ on BSUS.  CT abdomen/pelvis c/w pancreatitis, with Lipase markedly elevated.  Pt discussed with Family medicine and will be admitted for further mgt.  Patient care was discussed with my attending, Dr. Radford Pax.    Gavin Pound, MD 01/04/15 1150  Nelva Nay, MD 01/09/15 [phone]

## 2015-01-03 NOTE — ED Notes (Signed)
Language line used with interpreter Heberto. Pt states that she has pain radiating through her stomach and to her back. Pt reports that the pain started after she ate this evening and that she has had this pain before. Pt presents with grimacing and groaning. Pt is alert and oriented at this time.

## 2015-01-04 ENCOUNTER — Inpatient Hospital Stay (HOSPITAL_COMMUNITY): Payer: Self-pay

## 2015-01-04 ENCOUNTER — Encounter (HOSPITAL_COMMUNITY): Payer: Self-pay | Admitting: Radiology

## 2015-01-04 DIAGNOSIS — K8500 Idiopathic acute pancreatitis without necrosis or infection: Secondary | ICD-10-CM

## 2015-01-04 DIAGNOSIS — E876 Hypokalemia: Secondary | ICD-10-CM

## 2015-01-04 DIAGNOSIS — R1010 Upper abdominal pain, unspecified: Secondary | ICD-10-CM

## 2015-01-04 LAB — LIPID PANEL
CHOL/HDL RATIO: 4.8 ratio
CHOLESTEROL: 193 mg/dL (ref 0–200)
HDL: 40 mg/dL — AB (ref >40)
LDL Cholesterol: 144 mg/dL — ABNORMAL HIGH (ref 0–99)
Triglycerides: 46 mg/dL (ref <150)
VLDL: 9 mg/dL (ref 0–40)

## 2015-01-04 LAB — BASIC METABOLIC PANEL
Anion gap: 6 (ref 5–15)
BUN: 10 mg/dL (ref 6–20)
CALCIUM: 7.9 mg/dL — AB (ref 8.9–10.3)
CO2: 23 mmol/L (ref 22–32)
CREATININE: 0.74 mg/dL (ref 0.44–1.00)
Chloride: 108 mmol/L (ref 101–111)
GFR calc non Af Amer: >60 mL/min (ref >60)
Glucose, Bld: 138 mg/dL — ABNORMAL HIGH (ref 65–99)
Potassium: 3.8 mmol/L (ref 3.5–5.1)
SODIUM: 137 mmol/L (ref 135–145)

## 2015-01-04 LAB — PHOSPHORUS: PHOSPHORUS: 3.9 mg/dL (ref 2.5–4.6)

## 2015-01-04 LAB — LIPASE, BLOOD

## 2015-01-04 LAB — CBC
HCT: 35.3 % — ABNORMAL LOW (ref 36.0–46.0)
Hemoglobin: 11.2 g/dL — ABNORMAL LOW (ref 12.0–15.0)
MCH: 27.2 pg (ref 26.0–34.0)
MCHC: 31.7 g/dL (ref 30.0–36.0)
MCV: 85.7 fL (ref 78.0–100.0)
PLATELETS: 218 10*3/uL (ref 150–400)
RBC: 4.12 MIL/uL (ref 3.87–5.11)
RDW: 13.4 % (ref 11.5–15.5)
WBC: 5.6 10*3/uL (ref 4.0–10.5)

## 2015-01-04 LAB — MAGNESIUM: MAGNESIUM: 2.2 mg/dL (ref 1.7–2.4)

## 2015-01-04 MED ORDER — IOHEXOL 300 MG/ML  SOLN
100.0000 mL | Freq: Once | INTRAMUSCULAR | Status: AC | PRN
  Administered 2015-01-04: 100 mL via INTRAVENOUS

## 2015-01-04 MED ORDER — SODIUM CHLORIDE 0.9 % IV SOLN
INTRAVENOUS | Status: DC
  Administered 2015-01-04: 02:00:00 via INTRAVENOUS

## 2015-01-04 MED ORDER — POTASSIUM CHLORIDE 10 MEQ/100ML IV SOLN
10.0000 meq | INTRAVENOUS | Status: AC
  Administered 2015-01-04 (×2): 10 meq via INTRAVENOUS
  Filled 2015-01-04 (×2): qty 100

## 2015-01-04 MED ORDER — PROMETHAZINE HCL 25 MG PO TABS: 12.5000 mg | ORAL_TABLET | Freq: Four times a day (QID) | ORAL | Status: DC | PRN

## 2015-01-04 MED ORDER — ENOXAPARIN SODIUM 40 MG/0.4ML ~~LOC~~ SOLN
40.0000 mg | SUBCUTANEOUS | Status: DC
  Administered 2015-01-04 – 2015-01-06 (×3): 40 mg via SUBCUTANEOUS
  Filled 2015-01-04 (×3): qty 0.4

## 2015-01-04 MED ORDER — PANTOPRAZOLE SODIUM 40 MG IV SOLR
40.0000 mg | INTRAVENOUS | Status: DC
  Administered 2015-01-04 – 2015-01-06 (×3): 40 mg via INTRAVENOUS
  Filled 2015-01-04 (×3): qty 40

## 2015-01-04 MED ORDER — HYDROMORPHONE HCL 1 MG/ML IJ SOLN
0.5000 mg | INTRAMUSCULAR | Status: DC | PRN
  Administered 2015-01-04 (×2): 0.5 mg via INTRAVENOUS
  Filled 2015-01-04 (×2): qty 1

## 2015-01-04 MED ORDER — DEXTROSE-NACL 5-0.9 % IV SOLN
INTRAVENOUS | Status: DC
  Administered 2015-01-04 (×2): via INTRAVENOUS

## 2015-01-04 NOTE — Progress Notes (Signed)
Utilization review completed.  

## 2015-01-04 NOTE — Progress Notes (Signed)
FPTS Interim Progress Note  Utilized [employer] Interpreter   S: Patient doing well. Last vomited around 11:30pm. Continues to have some nausea and diffuse abdominal pain however notes its improved from admission.    O: BP 113/67 mmHg  Pulse 51  Temp(Src) 98.3 F (36.8 C) (Oral)  Resp 16  Wt 159 lb 9.8 oz (72.4 kg)  SpO2 96% General: Lying in bed sleeping in NAD. Non-toxic Eyes: Conjunctivae non-injected.  ENTM: Dry mucous membranes. ] Cardiovascular: RRR. No murmurs, rubs, or gallops noted. No pitting edema noted. Respiratory: No increased WOB. CTAB without wheezing, rhonchi, or crackles noted. Abdomen: +BS in all 4 quadrants, soft, non-distended, tender to palpation in the epigastric and upper quadrants bilaterally. No rebound or guarding.    A/P: 54 y/o presenting with acute pancreatitis (lipase of 3000 and CT abdomen with fluid / inflammation around the pancreatic head / duodenum) of unknown etiology. Triglycerides 46. Not a heavy alcohol drinker per report.  No gallstones noted on CT, however not incredibly sensitive. Continues to be afebrile without a leukocytosis.  - continue NPO status  - continue Dilaudid for pain control, has only required 1 PRN over night (but did received multiple doses of morphine in the ED).  - RUQ U/S pending.  Joanna Puffrystal S Elmar Antigua, MD 01/04/2015, 8:24 AM PGY-2, [facility] pager [phone]

## 2015-01-04 NOTE — ED Notes (Signed)
Patient presents with severe pain to her upper abd and mid abd area that goes around to her back.  Has been going on for 4 days but got worse this evening after eating a hot spicey meal and drinking milk

## 2015-01-04 NOTE — H&P (Signed)
Family Medicine Teaching [facility] History and Physical Service Pager: [phone]  Patient name: Evelyn Dillon Medical record number: [id number] Date of birth: 12/17/1960 Age: 54 y.o. Gender: female  Primary Care Provider: No PCP Per Patient Consultants: None Code Status: Full  Chief Complaint: Abdominal pain.   Assessment and Plan: Evelyn PilarMaria I Tovey is a 54 y.o. female presenting with pancreatitis. PMH is significant for syncopal episode 04/2014 without identification of etiology - cardiac monitor ordered but pt. Never followed through.   # Pancreatitis - Pt. Here with abdominal pain since this evening. Pain is severe, epigastric and radiating to the back. TTP on exam. Lipase > 3000. WBC normal. No other electrolyte disturbances at this time. CT abdomen with fluid / inflammation around the pancreatic head / duodenum. Cannot exclude duodenal ulceration. No identifiable etiology for pancreatitis at this time. Denies ETOH. She does have some postprandial RUQ discomfort in the past. No known hyperlipidemia. No family hx of autoimmune disorder, no personal hx of DM or autoimmune disorder.  - Admit to inpatient under Dr. Gwendolyn GrantWalden - NPO - Pain control with dilaudid - Antiemetics - Fluids at 1.5 x MIVF D5NS. S/p 1L bolus in the ED.  - Check Mg, Phos - Lipid panel  - RUQ ultrasound for sludge / small stones not picked up on CT.  - PPI given ? Duodenal ulceration.  - Likely at risk for H. Pylori. Consider H. Pylor abs.  - See how she does throughout the day and likely advance diet the next day.    FEN/GI: NPO, D5NS.  Prophylaxis: Lovenox.   Disposition: Pending improvement in abdominal pain.   History of Present Illness:  Evelyn PilarMaria I Ken is a 54 y.o. female presenting with Abdominal pain that started after dinner this evening around 6 pm. She says that she had a spicy meal, and shortly thereafter she began having severe epigastric pain radiating to her back. She has had nausea and  vomiting. No fevers or chills. No diarrhea. She says that she has not ever had this before. She does sometimes get postprandial pain, though it is mild, brief, and resolves on its own. She denies ETOH except a small amount of tequilla yesterday, which she says is the first time she has had alcohol in a long time. She does not smoke tobacco or anything else. She has her gallbladder in place. She denies dysuria, hematuria, or flank pain. No recent weight loss, or night sweats.   Review Of Systems: Per HPI with the following additions: none.  Otherwise 12 point review of systems was performed and was unremarkable.  Patient Active Problem List   Diagnosis Date Noted  . Pancreatitis 01/04/2015  . Facial trauma 05/07/2014  . Left arm pain 05/07/2014  . Syncope 05/06/2014  . ANEMIA, IRON DEFICIENCY, UNSPEC. 05/11/2006   Past Medical History: Past Medical History  Diagnosis Date  . Kidney infection   . Syncope     a. 04/2014: tele unrevealing, echo with normal EF & grade 2 DD, normal nuc. Etiology not clear.   Past Surgical History: Past Surgical History  Procedure Laterality Date  . No prior surgery     Social History: Social History  Substance Use Topics  . Smoking status: Never Smoker   . Smokeless tobacco: None  . Alcohol Use: No   Additional social history: none.   Please also refer to relevant sections of EMR.  Family History: Family History  Problem Relation Age of Onset  . Diabetes Mellitus I Sister   .  Heart disease Sister     Died at 42   Allergies and Medications: No Known Allergies No current facility-administered medications on file prior to encounter.   No current outpatient prescriptions on file prior to encounter.    Objective: BP 115/69 mmHg  Pulse 67  Temp(Src) 97.6 F (36.4 C) (Oral)  Resp 23  SpO2 98% Exam: General: NAD,AAOx3, resting comfortably in bed.  Eyes: EOMI, PERRLA ENTM: Nares patent, O/P clear, poor dentition.  Neck: FROM, supple, no  thyromegaly.  Cardiovascular: RRR, NO MGR, normal S1/S2, 2+ distal pulses.  Respiratory: CTA Bilaterally, appropriate rate, unlabored.  Abdomen: S, TTP in the epigastrum and LUQ, +BS, no peritoneal signs.  MSK: MAEW, Nontender, no edema.  Skin: no rashes, no lesions.  Neuro: no focal deficits.  Psych: Appropriate mood / affect.   Labs and Imaging: CBC BMET   Recent Labs Lab 01/03/15 2143  WBC 7.9  HGB 13.3  HCT 40.0  PLT 263    Recent Labs Lab 01/03/15 2143  NA 138  K 3.4*  CL 108  CO2 25  BUN 13  CREATININE 0.97  GLUCOSE 120*  CALCIUM 8.9     Lipase - > 3000 AST 36 ALT 34  Troponin - 0.0  CT abdomen:  IMPRESSION: 1. Soft tissue inflammation and edema about the pancreatic head and body, and about the duodenum, with significant fluid tracking inferiorly along the duodenum and right kidney. This may reflect acute pancreatitis, though given that the process appears more prominent about the duodenum, an underlying duodenal ulceration could have a similar appearance. No evidence of pancreatic devascularization or pseudocyst formation. 2. Minimal diverticulosis along the sigmoid colon, without evidence of diverticulitis.  DG Chest:  IMPRESSION: Mild left basilar subsegmental atelectasis. No other active cardiopulmonary disease.   Urinalysis    Component Value Date/Time   COLORURINE YELLOW 01/03/2015 2238   APPEARANCEUR CLOUDY* 01/03/2015 2238   LABSPEC 1.018 01/03/2015 2238   PHURINE 5.0 01/03/2015 2238   GLUCOSEU NEGATIVE 01/03/2015 2238   HGBUR TRACE* 01/03/2015 2238   BILIRUBINUR NEGATIVE 01/03/2015 2238   KETONESUR NEGATIVE 01/03/2015 2238   PROTEINUR NEGATIVE 01/03/2015 2238   UROBILINOGEN 0.2 01/03/2015 2238   NITRITE NEGATIVE 01/03/2015 2238   LEUKOCYTESUR NEGATIVE 01/03/2015 2238      Yolande Jolly, MD 01/04/2015, 2:49 AM PGY-2, [place] [facility] FPTS Intern pager: [phone], text pages welcome

## 2015-01-05 DIAGNOSIS — K859 Acute pancreatitis without necrosis or infection, unspecified: Principal | ICD-10-CM

## 2015-01-05 LAB — GLUCOSE, CAPILLARY: Glucose-Capillary: 101 mg/dL — ABNORMAL HIGH (ref 65–99)

## 2015-01-05 MED ORDER — ACETAMINOPHEN 325 MG PO TABS
650.0000 mg | ORAL_TABLET | Freq: Four times a day (QID) | ORAL | Status: DC | PRN
  Administered 2015-01-05: 650 mg via ORAL
  Filled 2015-01-05: qty 2

## 2015-01-05 MED ORDER — HYDROMORPHONE HCL 2 MG PO TABS
1.0000 mg | ORAL_TABLET | ORAL | Status: DC | PRN
  Administered 2015-01-05 – 2015-01-06 (×2): 1 mg via ORAL
  Filled 2015-01-05 (×2): qty 1

## 2015-01-05 NOTE — Progress Notes (Signed)
Family Medicine Teaching Service Daily Progress Note Intern Pager: [phone]  Patient name: Evelyn Dillon Medical record number: [id number] Date of birth: 02-04-1961 Age: 54 y.o. Gender: female  Primary Care Provider: No PCP Per Patient Consultants: None Code Status: Full   Pt Overview and Major Events to Date:   Assessment and Plan: Evelyn Dillon is a 54 y.o. female presenting with pancreatitis. PMH is significant for syncopal episode 04/2014 without identification of etiology - cardiac monitor ordered but pt. Never followed through.   # Pancreatitis - Patient with 3/10 epigastric pain. TTP on exam. Lipase > 3000. WBC normal. No other electrolyte disturbances at this time. CT abdomen with fluid / inflammation around the pancreatic head / duodenum. Cannot exclude duodenal ulceration. No identifiable etiology for pancreatitis at this time. Denies ETOH. She does have some postprandial RUQ discomfort in the past. No known hyperlipidemia.  Mg/Phosphorus wnl. Gallbladder without sludge, or stones etc.Triglycerides not significantly elevated.  - Clear liquid diets  -  Dilaudid -> PO pain control  - Antiemetics - D/C fluids at 1.5 x MIVF D5NS. S/p 1L bolus in the ED.  -Continue  PPI given/ Duodenal ulceration.  - Likely at risk for H. Pylori. Consider H. Pylor abs.  - Will advance diet today   FEN/GI: Clear liquid, D5NS.  Prophylaxis: Lovenox.   Disposition: Pending improvement in abdominal pain  Subjective:  Patient improving.   Objective: Temp:  [98.4 F (36.9 C)-98.8 F (37.1 C)] 98.4 F (36.9 C) (10/24 0621) Pulse Rate:  [60-79] 62 (10/24 0621) Resp:  [16-18] 16 (10/24 0621) BP: (114-125)/(70-73) 123/70 mmHg (10/24 0621) SpO2:  [92 %-98 %] 98 % (10/24 0621) Weight:  [162 lb 0.6 oz (73.5 kg)] 162 lb 0.6 oz (73.5 kg) (10/24 9629) Physical Exam: General: Patient lying in bed, NAD  Cardiovascular: RRR, no murmurs, rubs, gallops  Respiratory: CTAB Abdomen: TTP to palpation of  epigastric area,  Extremities: No lower extremity edema   Laboratory:  Recent Labs Lab 01/03/15 2143 01/04/15 0419  WBC 7.9 5.6  HGB 13.3 11.2*  HCT 40.0 35.3*  PLT 263 218    Recent Labs Lab 01/03/15 2143 01/04/15 0419  NA 138 137  K 3.4* 3.8  CL 108 108  CO2 25 23  BUN 13 10  CREATININE 0.97 0.74  CALCIUM 8.9 7.9*  PROT 7.0  --   BILITOT 0.4  --   ALKPHOS 88  --   ALT 34  --   AST 36  --   GLUCOSE 120* 138*     Imaging/Diagnostic Tests: Ct Abdomen Pelvis W Contrast  01/04/2015  CLINICAL DATA:  Acute onset of periumbilical abdominal pain, radiating to the back. Nausea and vomiting. Initial encounter. EXAM: CT ABDOMEN AND PELVIS WITH CONTRAST TECHNIQUE: Multidetector CT imaging of the abdomen and pelvis was performed using the standard protocol following bolus administration of intravenous contrast. CONTRAST:  OMNIPAQUE IOHEXOL 300 MG/ML  SOLN COMPARISON:  None. FINDINGS: The visualized lung bases are clear. Soft tissue inflammation and edema are noted about the pancreatic head and body, and about the duodenum, with significant fluid tracking inferiorly along the duodenum and right kidney. This may reflect acute pancreatitis, though given that the process appears more prominent about the duodenum, an underlying duodenal ulceration could have a similar appearance. There is no evidence of pancreatic devascularization or pseudocyst formation. The liver and spleen are unremarkable in appearance. The gallbladder is within normal limits. The pancreas and adrenal glands are unremarkable. The kidneys are unremarkable  in appearance. There is no evidence of hydronephrosis. No renal or ureteral stones are seen. No perinephric stranding is appreciated. No free fluid is identified. The small bowel is unremarkable in appearance. The stomach is within normal limits. No acute vascular abnormalities are seen. The appendix is normal in caliber, without evidence for appendicitis. Minimal  diverticulosis is noted along the sigmoid colon, without evidence of diverticulitis. The bladder is mildly distended and grossly unremarkable. The uterus is unremarkable in appearance. The ovaries are grossly symmetric. No suspicious adnexal masses are seen. No inguinal lymphadenopathy is seen. No acute osseous abnormalities are identified. IMPRESSION: 1. Soft tissue inflammation and edema about the pancreatic head and body, and about the duodenum, with significant fluid tracking inferiorly along the duodenum and right kidney. This may reflect acute pancreatitis, though given that the process appears more prominent about the duodenum, an underlying duodenal ulceration could have a similar appearance. No evidence of pancreatic devascularization or pseudocyst formation. 2. Minimal diverticulosis along the sigmoid colon, without evidence of diverticulitis. Electronically Signed   By: Roanna RaiderJeffery  Chang M.D.   On: 01/04/2015 00:38   Dg Chest Portable 1 View  01/03/2015  CLINICAL DATA:  Initial valuation for 3 day history of acute upper abdominal pain. EXAM: PORTABLE CHEST 1 VIEW COMPARISON:  Prior radiograph from 05/06/2014. FINDINGS: The cardiac and mediastinal silhouettes are stable in size and contour, and remain within normal limits. The lungs are normally inflated. Minimal left basilar atelectasis. No airspace consolidation, pleural effusion, or pulmonary edema is identified. There is no pneumothorax. No acute osseous abnormality identified. IMPRESSION: Mild left basilar subsegmental atelectasis. No other active cardiopulmonary disease. Electronically Signed   By: Rise MuBenjamin  McClintock M.D.   On: 01/03/2015 22:19   [place] Abdomen Limited Ruq  01/04/2015  CLINICAL DATA:  [age] year old female with right upper quadrant abdominal pain for 2 days. Pancreatitis. EXAM: US ABDOMEN LIMITED - RIGHT UPPER QUADRANT COMPARISON:  01/04/2015 CT FINDINGS: Gallbladder: The gallbladder is unremarkable. There is no evidence of  cholelithiasis, sludge or acute cholecystitis. Common bile duct: Diameter: 3.0 mm. There is no evidence of intrahepatic or extrahepatic biliary dilatation. Liver: No focal lesion identified. Within normal limits in parenchymal echogenicity. IMPRESSION: Unremarkable right upper quadrant abdominal ultrasound. Electronically Signed   By: Harmon PierJeffrey  Hu M.D.   On: 01/04/2015 13:50    Asiyah Mayra ReelZahra Mikell, MD 01/05/2015, 7:15 AM PGY-1, [facility] FPTS Intern pager: [phone], text pages welcome

## 2015-01-05 NOTE — Progress Notes (Signed)
Interpreter Wyvonnia DuskyGraciela Namihira for [employer]

## 2015-01-06 DIAGNOSIS — R1013 Epigastric pain: Secondary | ICD-10-CM

## 2015-01-06 LAB — GLUCOSE, CAPILLARY: GLUCOSE-CAPILLARY: 96 mg/dL (ref 65–99)

## 2015-01-06 MED ORDER — PANTOPRAZOLE SODIUM 40 MG PO TBEC: 40.0000 mg | DELAYED_RELEASE_TABLET | Freq: Every day | ORAL | Status: DC

## 2015-01-06 NOTE — Discharge Instructions (Signed)
You were admitted for acute pancreatitis. You are ready to discharge. I have prescribed a 30 day supply of pantoprazole please continue taking this daily.    Acute Pancreatitis Acute pancreatitis is a disease in which the pancreas becomes suddenly inflamed. The pancreas is a large gland located behind your stomach. The pancreas produces enzymes that help digest food. The pancreas also releases the hormones glucagon and insulin that help regulate blood sugar. Damage to the pancreas occurs when the digestive enzymes from the pancreas are activated and begin attacking the pancreas before being released into the intestine. Most acute attacks last a couple of days and can cause serious complications. Some people become dehydrated and develop low blood pressure. In severe cases, bleeding into the pancreas can lead to shock and can be life-threatening. The lungs, heart, and kidneys may fail. CAUSES  Pancreatitis can happen to anyone. In some cases, the cause is unknown. Most cases are caused by:  Alcohol abuse.  Gallstones. Other less common causes are:  Certain medicines.  Exposure to certain chemicals.  Infection.  Damage caused by an accident (trauma).  Abdominal surgery. SYMPTOMS   Pain in the upper abdomen that may radiate to the back.  Tenderness and swelling of the abdomen.  Nausea and vomiting. DIAGNOSIS  Your caregiver will perform a physical exam. Blood and stool tests may be done to confirm the diagnosis. Imaging tests may also be done, such as X-rays, CT scans, or an ultrasound of the abdomen. TREATMENT  Treatment usually requires a stay in the hospital. Treatment may include:  Pain medicine.  Fluid replacement through an intravenous line (IV).  Placing a tube in the stomach to remove stomach contents and control vomiting.  Not eating for 3 or 4 days. This gives your pancreas a rest, because enzymes are not being produced that can cause further damage.  Antibiotic  medicines if your condition is caused by an infection.  Surgery of the pancreas or gallbladder. HOME CARE INSTRUCTIONS   Follow the diet advised by your caregiver. This may involve avoiding alcohol and decreasing the amount of fat in your diet.  Eat smaller, more frequent meals. This reduces the amount of digestive juices the pancreas produces.  Drink enough fluids to keep your urine clear or pale yellow.  Only take over-the-counter or prescription medicines as directed by your caregiver.  Avoid drinking alcohol if it caused your condition.  Do not smoke.  Get plenty of rest.  Check your blood sugar at home as directed by your caregiver.  Keep all follow-up appointments as directed by your caregiver. SEEK MEDICAL CARE IF:   You do not recover as quickly as expected.  You develop new or worsening symptoms.  You have persistent pain, weakness, or nausea.  You recover and then have another episode of pain. SEEK IMMEDIATE MEDICAL CARE IF:   You are unable to eat or keep fluids down.  Your pain becomes severe.  You have a fever or persistent symptoms for more than 2 to 3 days.  You have a fever and your symptoms suddenly get worse.  Your skin or the white part of your eyes turn yellow (jaundice).  You develop vomiting.  You feel dizzy, or you faint.  Your blood sugar is high (over 300 mg/dL). MAKE SURE YOU:   Understand these instructions.  Will watch your condition.  Will get help right away if you are not doing well or get worse.   This information is not intended to replace advice given  to you by your health care provider. Make sure you discuss any questions you have with your health care provider.   Document Released: 02/28/2005 Document Revised: 08/30/2011 Document Reviewed: 06/09/2011 Elsevier Interactive Patient Education [employer]

## 2015-01-06 NOTE — Progress Notes (Signed)
Family Medicine Teaching Service Daily Progress Note Intern Pager: [phone]  Patient name: Evelyn Dillon Medical record number: [id number] Date of birth: 11/22/1960 Age: 54 y.o. Gender: female  Primary Care Provider: No PCP Per Patient Consultants: None Code Status: Full   Pt Overview and Major Events to Date:   Assessment and Plan: Evelyn Dillon is a 54 y.o. female presenting with pancreatitis. PMH is significant for syncopal episode 04/2014 without identification of etiology - cardiac monitor ordered but pt. Never followed through.   # Pancreatitis - Patient with 3/10 epigastric pain. TTP on exam. Lipase > 3000. WBC normal. No other electrolyte disturbances at this time. CT abdomen with fluid / inflammation around the pancreatic head / duodenum. Cannot exclude duodenal ulceration. No identifiable etiology for pancreatitis at this time. Denies ETOH. She does have some postprandial RUQ discomfort in the past. No known hyperlipidemia.  Mg/Phosphorus wnl. Gallbladder without sludge, or stones etc.Triglycerides not significantly elevated.  - Regular diet   - Antiemetics -Continue  PPI  - Could consider H. Pylori. Abs.testing as an outpatient  - Discharge today, after care management gives info for hospital follow up   FEN/GI: Regular Diet , D5NS.  Prophylaxis: Lovenox.   Disposition: Home   Subjective:  Patient ready for discharge. Tolerated a regular diet without issue. No n/v   Objective: Temp:  [98.4 F (36.9 C)-98.8 F (37.1 C)] 98.8 F (37.1 C) (10/25 0500) Pulse Rate:  [63-72] 63 (10/25 0500) Resp:  [16] 16 (10/25 0500) BP: (115-127)/(74-79) 126/74 mmHg (10/25 0500) SpO2:  [98 %-99 %] 98 % (10/25 0500) Weight:  [158 lb 8.2 oz (71.9 kg)] 158 lb 8.2 oz (71.9 kg) (10/25 0500) Physical Exam: General: Patient lying in bed, NAD  Cardiovascular: RRR, no murmurs, rubs, gallops  Respiratory: CTAB Abdomen: TTP to palpation of epigastric area, BS+  Extremities: No lower extremity  edema   Laboratory:  Recent Labs Lab 01/03/15 2143 01/04/15 0419  WBC 7.9 5.6  HGB 13.3 11.2*  HCT 40.0 35.3*  PLT 263 218    Recent Labs Lab 01/03/15 2143 01/04/15 0419  NA 138 137  K 3.4* 3.8  CL 108 108  CO2 25 23  BUN 13 10  CREATININE 0.97 0.74  CALCIUM 8.9 7.9*  PROT 7.0  --   BILITOT 0.4  --   ALKPHOS 88  --   ALT 34  --   AST 36  --   GLUCOSE 120* 138*     Imaging/Diagnostic Tests: Ct Abdomen Pelvis W Contrast  01/04/2015  CLINICAL DATA:  Acute onset of periumbilical abdominal pain, radiating to the back. Nausea and vomiting. Initial encounter. EXAM: CT ABDOMEN AND PELVIS WITH CONTRAST TECHNIQUE: Multidetector CT imaging of the abdomen and pelvis was performed using the standard protocol following bolus administration of intravenous contrast. CONTRAST:  OMNIPAQUE IOHEXOL 300 MG/ML  SOLN COMPARISON:  None. FINDINGS: The visualized lung bases are clear. Soft tissue inflammation and edema are noted about the pancreatic head and body, and about the duodenum, with significant fluid tracking inferiorly along the duodenum and right kidney. This may reflect acute pancreatitis, though given that the process appears more prominent about the duodenum, an underlying duodenal ulceration could have a similar appearance. There is no evidence of pancreatic devascularization or pseudocyst formation. The liver and spleen are unremarkable in appearance. The gallbladder is within normal limits. The pancreas and adrenal glands are unremarkable. The kidneys are unremarkable in appearance. There is no evidence of hydronephrosis. No renal or ureteral  stones are seen. No perinephric stranding is appreciated. No free fluid is identified. The small bowel is unremarkable in appearance. The stomach is within normal limits. No acute vascular abnormalities are seen. The appendix is normal in caliber, without evidence for appendicitis. Minimal diverticulosis is noted along the sigmoid colon,  without evidence of diverticulitis. The bladder is mildly distended and grossly unremarkable. The uterus is unremarkable in appearance. The ovaries are grossly symmetric. No suspicious adnexal masses are seen. No inguinal lymphadenopathy is seen. No acute osseous abnormalities are identified. IMPRESSION: 1. Soft tissue inflammation and edema about the pancreatic head and body, and about the duodenum, with significant fluid tracking inferiorly along the duodenum and right kidney. This may reflect acute pancreatitis, though given that the process appears more prominent about the duodenum, an underlying duodenal ulceration could have a similar appearance. No evidence of pancreatic devascularization or pseudocyst formation. 2. Minimal diverticulosis along the sigmoid colon, without evidence of diverticulitis. Electronically Signed   By: Roanna RaiderJeffery  Chang M.D.   On: 01/04/2015 00:38   Dg Chest Portable 1 View  01/03/2015  CLINICAL DATA:  Initial valuation for 3 day history of acute upper abdominal pain. EXAM: PORTABLE CHEST 1 VIEW COMPARISON:  Prior radiograph from 05/06/2014. FINDINGS: The cardiac and mediastinal silhouettes are stable in size and contour, and remain within normal limits. The lungs are normally inflated. Minimal left basilar atelectasis. No airspace consolidation, pleural effusion, or pulmonary edema is identified. There is no pneumothorax. No acute osseous abnormality identified. IMPRESSION: Mild left basilar subsegmental atelectasis. No other active cardiopulmonary disease. Electronically Signed   By: Rise MuBenjamin  McClintock M.D.   On: 01/03/2015 22:19   [place] Abdomen Limited Ruq  01/04/2015  CLINICAL DATA:  54 year old female with right upper quadrant abdominal pain for 2 days. Pancreatitis. EXAM: US ABDOMEN LIMITED - RIGHT UPPER QUADRANT COMPARISON:  01/04/2015 CT FINDINGS: Gallbladder: The gallbladder is unremarkable. There is no evidence of cholelithiasis, sludge or acute cholecystitis. Common bile  duct: Diameter: 3.0 mm. There is no evidence of intrahepatic or extrahepatic biliary dilatation. Liver: No focal lesion identified. Within normal limits in parenchymal echogenicity. IMPRESSION: Unremarkable right upper quadrant abdominal ultrasound. Electronically Signed   By: Harmon PierJeffrey  Hu M.D.   On: 01/04/2015 13:50    Rolan Wrightsman Mayra ReelZahra Rendi Mapel, MD 01/06/2015, 8:12 AM PGY-1, [facility] FPTS Intern pager: [phone], text pages welcome

## 2015-01-06 NOTE — Progress Notes (Signed)
Interpreter Wyvonnia DuskyGraciela Namihira for [facility]  From care management and Eber Jonesarolyn  RN

## 2015-01-06 NOTE — Discharge Summary (Signed)
Family Medicine Teaching [facility] Discharge Summary  Patient name: Evelyn Dillon Medical record number: [id number] Date of birth: 1960/08/06 Age: 54 y.o. Gender: female Date of Admission: 01/03/2015  Date of Discharge: 01/06/2015 Admitting Physician: Tobey Grim, MD  Primary Care Provider: No PCP Per Patient Consultants: None    Indication for Hospitalization: Acute Pancreatitis   Discharge Diagnoses/Problem List: None   Disposition: Home   Discharge Condition: Stable   Discharge Exam:  Physical Exam: General: Patient lying in bed, NAD  Cardiovascular: RRR, no murmurs, rubs, gallops  Respiratory: CTAB Abdomen: TTP to palpation of epigastric area, BS+  Extremities: No lower extremity edema   Brief Hospital Course:  Patient presenting with presents with abdominal pain x 1 day. Describes as severe, epigastric, radiating to her back. Presented emergency department found to have lipase greater than 3000 with normal white count. Minimally decreased calcium. No other structural issues. CT abdomen revealed fluid/inflammation around pancreatic head. No known hyperlipidemia. Does not  have history of alcohol abuse or at least excessive intake. Gallbladder without sludge, or stones etc.Triglycerides not significantly elevated. Patient remained afebrile, WBC count was normal, no other electrolyte disturbances were found. No identifiable etiology for pancreatitis determined   Issues for Follow Up:  1. Started patient on PPI, as patient with complaints of epigastric pain intermittently in the past associated with food, revaluate for possible ulcer vs GERD. If hx not concerning for either, then consider stopping PPI  Significant Procedures: None  Significant Labs and Imaging:   Recent Labs Lab 01/03/15 2143 01/04/15 0419  WBC 7.9 5.6  HGB 13.3 11.2*  HCT 40.0 35.3*  PLT 263 218    Recent Labs Lab 01/03/15 2143 01/04/15 0419  NA 138 137  K 3.4* 3.8  CL 108 108  CO2  25 23  GLUCOSE 120* 138*  BUN 13 10  CREATININE 0.97 0.74  CALCIUM 8.9 7.9*  MG  --  2.2  PHOS  --  3.9  ALKPHOS 88  --   AST 36  --   ALT 34  --   ALBUMIN 3.9  --    Ct Abdomen Pelvis W Contrast  01/04/2015  CLINICAL DATA:  Acute onset of periumbilical abdominal pain, radiating to the back. Nausea and vomiting. Initial encounter. EXAM: CT ABDOMEN AND PELVIS WITH CONTRAST TECHNIQUE: Multidetector CT imaging of the abdomen and pelvis was performed using the standard protocol following bolus administration of intravenous contrast. CONTRAST:  OMNIPAQUE IOHEXOL 300 MG/ML  SOLN COMPARISON:  None. FINDINGS: The visualized lung bases are clear. Soft tissue inflammation and edema are noted about the pancreatic head and body, and about the duodenum, with significant fluid tracking inferiorly along the duodenum and right kidney. This may reflect acute pancreatitis, though given that the process appears more prominent about the duodenum, an underlying duodenal ulceration could have a similar appearance. There is no evidence of pancreatic devascularization or pseudocyst formation. The liver and spleen are unremarkable in appearance. The gallbladder is within normal limits. The pancreas and adrenal glands are unremarkable. The kidneys are unremarkable in appearance. There is no evidence of hydronephrosis. No renal or ureteral stones are seen. No perinephric stranding is appreciated. No free fluid is identified. The small bowel is unremarkable in appearance. The stomach is within normal limits. No acute vascular abnormalities are seen. The appendix is normal in caliber, without evidence for appendicitis. Minimal diverticulosis is noted along the sigmoid colon, without evidence of diverticulitis. The bladder is mildly distended and grossly unremarkable. The  uterus is unremarkable in appearance. The ovaries are grossly symmetric. No suspicious adnexal masses are seen. No inguinal lymphadenopathy is seen. No  acute osseous abnormalities are identified. IMPRESSION: 1. Soft tissue inflammation and edema about the pancreatic head and body, and about the duodenum, with significant fluid tracking inferiorly along the duodenum and right kidney. This may reflect acute pancreatitis, though given that the process appears more prominent about the duodenum, an underlying duodenal ulceration could have a similar appearance. No evidence of pancreatic devascularization or pseudocyst formation. 2. Minimal diverticulosis along the sigmoid colon, without evidence of diverticulitis. Electronically Signed   By: Roanna RaiderJeffery  Chang M.D.   On: 01/04/2015 00:38   Dg Chest Portable 1 View  01/03/2015  CLINICAL DATA:  Initial valuation for 3 day history of acute upper abdominal pain. EXAM: PORTABLE CHEST 1 VIEW COMPARISON:  Prior radiograph from 05/06/2014. FINDINGS: The cardiac and mediastinal silhouettes are stable in size and contour, and remain within normal limits. The lungs are normally inflated. Minimal left basilar atelectasis. No airspace consolidation, pleural effusion, or pulmonary edema is identified. There is no pneumothorax. No acute osseous abnormality identified. IMPRESSION: Mild left basilar subsegmental atelectasis. No other active cardiopulmonary disease. Electronically Signed   By: Rise MuBenjamin  McClintock M.D.   On: 01/03/2015 22:19   [place] Abdomen Limited Ruq  01/04/2015  CLINICAL DATA:  54 year old female with right upper quadrant abdominal pain for 2 days. Pancreatitis. EXAM: US ABDOMEN LIMITED - RIGHT UPPER QUADRANT COMPARISON:  01/04/2015 CT FINDINGS: Gallbladder: The gallbladder is unremarkable. There is no evidence of cholelithiasis, sludge or acute cholecystitis. Common bile duct: Diameter: 3.0 mm. There is no evidence of intrahepatic or extrahepatic biliary dilatation. Liver: No focal lesion identified. Within normal limits in parenchymal echogenicity. IMPRESSION: Unremarkable right upper quadrant abdominal ultrasound.  Electronically Signed   By: Harmon PierJeffrey  Hu M.D.   On: 01/04/2015 13:50   Results/Tests Pending at Time of Discharge: None  Discharge Medications:    Medication List    TAKE these medications        pantoprazole 40 MG tablet  Commonly known as:  PROTONIX  Take 1 tablet (40 mg total) by mouth daily.        Discharge Instructions: Please refer to Patient Instructions section of EMR for full details.  Patient was counseled important signs and symptoms that should prompt return to medical care, changes in medications, dietary instructions, activity restrictions, and follow up appointments.   Follow-Up Appointments: Follow-up Information    [facility] On [facility]/2015.   Why:  Appointment at 2:30pm 01/13/15, arrive at 2:15pm. Bring medication list, photo ID and $20.    Contact information:   [address] [place] [id number] [phone]      Asiyah Mayra ReelZahra Mikell, MD 01/07/2015, 4:19 PM PGY-1, [facility]

## 2015-01-06 NOTE — Progress Notes (Signed)
Evelyn BastosGarciela Namihira-Alfaro came to discuss the discharge papers with the patient before her going home.  She ask her some questions and [name] was able to answer the patient's questions.  Patient was discharged home.

## 2015-01-06 NOTE — Progress Notes (Signed)
Interpreter Wyvonnia DuskyGraciela Namihira for [employer] RN.

## 2015-01-06 NOTE — Care Management Note (Signed)
Case Management Note  Patient Details  Name: Evelyn Dillon MRN: [id number] Date of Birth: 1961/01/30  Subjective/Objective:         Admitted with pancreatitis           Action/Plan: [facility] for 01/13/15 at 2:30pm. Gave patient appointment information per interpreter. Gave patient discount pharmacy card. Patient stated that she will have family to assist her after discharge.  Expected Discharge Date:  01/06/15               Expected Discharge Plan:  Home/Self Care  In-House Referral:  Financial Counselor, Interpreting Services  Discharge planning Services  Follow-up appt scheduled, CM Consult  Post Acute Care Choice:    Choice offered to:     DME Arranged:    DME Agency:     HH Arranged:    HH Agency:     Status of Service:  Completed, signed off  Medicare Important Message Given:    Date Medicare IM Given:    Medicare IM give by:    Date Additional Medicare IM Given:    Additional Medicare Important Message give by:     If discussed at Long Length of Stay Meetings, dates discussed:    Additional Comments:  [name], Marcellina Jonsson Watson, RN 01/06/2015, 1:23 PM

## 2015-01-13 ENCOUNTER — Encounter: Payer: Self-pay | Admitting: Family Medicine

## 2015-01-13 ENCOUNTER — Ambulatory Visit: Payer: Self-pay | Attending: Family Medicine | Admitting: Family Medicine

## 2015-01-13 ENCOUNTER — Encounter (HOSPITAL_BASED_OUTPATIENT_CLINIC_OR_DEPARTMENT_OTHER): Payer: Self-pay | Admitting: Clinical

## 2015-01-13 VITALS — BP 125/79 | HR 71 | Temp 98.4°F | Resp 16 | Ht 63.0 in | Wt 154.0 lb

## 2015-01-13 DIAGNOSIS — F339 Major depressive disorder, recurrent, unspecified: Secondary | ICD-10-CM | POA: Insufficient documentation

## 2015-01-13 DIAGNOSIS — F330 Major depressive disorder, recurrent, mild: Secondary | ICD-10-CM | POA: Insufficient documentation

## 2015-01-13 DIAGNOSIS — K8500 Idiopathic acute pancreatitis without necrosis or infection: Secondary | ICD-10-CM | POA: Insufficient documentation

## 2015-01-13 DIAGNOSIS — K5909 Other constipation: Secondary | ICD-10-CM | POA: Insufficient documentation

## 2015-01-13 MED ORDER — CITALOPRAM HYDROBROMIDE 20 MG PO TABS: 20.0000 mg | ORAL_TABLET | Freq: Every day | ORAL | Status: DC

## 2015-01-13 MED ORDER — LACTULOSE 10 GM/15ML PO SOLN: 10.0000 g | Freq: Two times a day (BID) | ORAL | Status: DC | PRN

## 2015-01-13 NOTE — Progress Notes (Signed)
ASSESSMENT: Pt currently experiencing primarily symptoms of mild recurrent major depressive disorder, needs [facility] f[facility]; would benefit from community resources and supportive counseling regarding coping with symptoms of depression (and anxiety). Stage of Change: contemplative  PLAN: 1. F/U with behavioral health consultant in as needed 2. Psychiatric Medications: Celexa(starting today) 3. Behavioral recommendation(s):   -Consider making [facility] Spanish[facility]speaking [facility] -Consider [facility] and [facility] [employer] as community resources SUBJECTIVE: Pt. referred by Dr Venetia NightAmao for symptoms of anxiety and depression:  Pt. reports the following symptoms/concerns: Pt says she used to be on antidepressants about 3 or 4 years ago, and has been feeling increasing feelings of depression and anxiety. She is making sure she gets 8 hours of sleep/night and that is helping, and she wants a note from Dr Venetia NightAmao for her employer to approve her absence from work this past week, as she has been in the hospital and to health appointments to get well.  Duration of problem: 8 months Severity: moderate  OBJECTIVE: Orientation & Cognition: Oriented x3. Thought processes normal and appropriate to situation. Mood: teary. Affect: appropriate Appearance: appropriate Risk of harm to self or others: no risk of harm to self or others Substance use: none Assessments administered: PHQ9: 14/ GAD7: 13  Diagnosis: Mild episode of recurrent major depressive disorder CPT Code: F33.0 -------------------------------------------- Other(s) present in the room: phone interpreter, Spanish  Time spent with patient in exam room: 40 minutes

## 2015-01-13 NOTE — Progress Notes (Signed)
Pt's here for HFU for Pancreatitis.   Pt rates pain at 2/10.   Pt reports pain in stomach and back  Describes pain as aching.  Pt reports syncope x8 mo ago. Pt being feeling depressed since then she concern as to why it happened.

## 2015-01-13 NOTE — Patient Instructions (Signed)
Estreimiento - Adultos (Constipation, Adult) Estreimiento significa que una persona tiene menos de tres evacuaciones en una semana, dificultad para defecar, o que las heces son secas, duras, o ms grandes que lo normal. A medida que envejecemos el estreimiento es ms comn. Una dieta baja en fibra, no tomar suficientes lquidos y el uso de ciertos medicamentos pueden empeorar el estreimiento.  CAUSAS   Ciertos medicamentos, como los antidepresivos, analgsicos, suplementos de hierro, anticidos y diurticos.  Algunas enfermedades, como la diabetes, el sndrome del colon irritable, enfermedad de la tiroides, o depresin.  No beber suficiente agua.  No consumir suficientes alimentos ricos en fibra.  Situaciones de estrs o viajes.  Falta de actividad fsica o de ejercicio.  Ignorar la necesidad sbita de defecar.  Uso en exceso de laxantes. SIGNOS Y SNTOMAS   Defecar menos de tres veces por semana.  Dificultad para defecar.  Tener las heces secas y duras, o ms grandes que las normales.  Sensacin de estar lleno o hinchado.  Dolor en la parte baja del abdomen.  No sentir alivio despus de defecar. DIAGNSTICO  El mdico le har una historia clnica y un examen fsico. Pueden hacerle exmenes adicionales para el estreimiento grave. Estos estudios pueden ser:  Un radiografa con enema de bario para examinar el recto, el colon y, en algunos casos, el intestino delgado.  Una sigmoidoscopia para examinar el colon inferior.  Una colonoscopia para examinar todo el colon. TRATAMIENTO  El tratamiento depender de la gravedad del estreimiento y de la causa. Algunos tratamientos nutricionales son beber ms lquidos y comer ms alimentos ricos en fibra. El cambio en el estilo de vida incluye hacer ejercicios de manera regular. Si estas recomendaciones para realizar cambios en la dieta y en el estilo de vida no ayudan, el mdico le puede indicar el uso de laxantes de venta libre  para ayudarlo a defecar. Los medicamentos recetados se pueden prescribir si los medicamentos de venta libre no lo ayudan.  INSTRUCCIONES PARA EL CUIDADO EN EL HOGAR   Consuma alimentos con alto contenido de fibra, como frutas, vegetales, cereales integrales y porotos.  Limite los alimentos procesados ricos en grasas y azcar, como las papas fritas, hamburguesas, galletas, dulces y refrescos.  Puede agregar un suplemento de fibra a su dieta si no obtiene lo suficiente de los alimentos.  Beba suficiente lquido para mantener la orina clara o de color amarillo plido.  Haga ejercicio regularmente o segn las indicaciones del mdico.  Vaya al bao cuando sienta la necesidad de ir. No se aguante las ganas.  Tome solo medicamentos de venta libre o recetados, segn las indicaciones del mdico. No tome otros medicamentos para el estreimiento sin consultarlo antes con su mdico. SOLICITE ATENCIN MDICA DE INMEDIATO SI:   Observa sangre brillante en las heces.  El estreimiento dura ms de 4 das o empeora.  Siente dolor abdominal o rectal.  Las heces son delgadas como un lpiz.  Pierde peso de manera inexplicable. ASEGRESE DE QUE:   Comprende estas instrucciones.  Controlar su afeccin.  Recibir ayuda de inmediato si no mejora o si empeora.   Esta informacin no tiene como fin reemplazar el consejo del mdico. Asegrese de hacerle al mdico cualquier pregunta que tenga.   Document Released: 03/20/2007 Document Revised: 03/21/2014 Elsevier Interactive Patient Education 2016 [employer]  

## 2015-01-13 NOTE — Progress Notes (Signed)
CC: Follow-up from hospitalization (01/03/15 -01/06/15)  HPI: Evelyn Dillon is a 54 y.o. female was recently hospitalized for acute pancreatitis at [facility] after she presented to the ED with abdominal pain radiating to her back and lipase on presentation was greater than 3000.  CT abdomen and pelvis reveals fluid/inflammation around the pancreatic head, body and around the duodenum, minimal diverticulosis in the sigmoid colon without evidence of diverticulitis. WBC, triglycerides were within normal limits.  She was placed on PPI and her condition gradually improved which she was subsequently discharged.  Today she reports doing well and states abdominal pain is at 2/10 she endorses some constipation. Patient has No headache, No chest pain, No Nausea, No new weakness tingling or numbness, No Cough - SOB. She admits to being depressed and was previously on an antidepressant in the past.  No Known Allergies Past Medical History  Diagnosis Date  . Kidney infection   . Syncope     a. 04/2014: tele unrevealing, echo with normal EF & grade 2 DD, normal nuc. Etiology not clear.   Current Outpatient Prescriptions on File Prior to Visit  Medication Sig Dispense Refill  . pantoprazole (PROTONIX) 40 MG tablet Take 1 tablet (40 mg total) by mouth daily. 30 tablet 0   No current facility-administered medications on file prior to visit.   Family History  Problem Relation Age of Onset  . Diabetes Mellitus I Sister   . Heart disease Sister     Died at 61   Social History   Social History  . Marital Status: Married    Spouse Name: N/A  . Number of Children: 7  . Years of Education: N/A   Occupational History  . Not on file.   Social History Main Topics  . Smoking status: Never Smoker   . Smokeless tobacco: Not on file  . Alcohol Use: No  . Drug Use: No  . Sexual Activity: Yes   Other Topics Concern  . Not on file   Social History Narrative    Review of  Systems: Constitutional: Negative for fever, chills, diaphoresis, activity change, appetite change and fatigue. HENT: Negative for ear pain, nosebleeds, congestion, facial swelling, rhinorrhea, neck pain, neck stiffness and ear discharge.  Eyes: Negative for pain, discharge, redness, itching and visual disturbance. Respiratory: Negative for cough, choking, chest tightness, shortness of breath, wheezing and stridor.  Cardiovascular: Negative for chest pain, palpitations and leg swelling. Gastrointestinal: Negative for abdominal distention, positive for mild abdominal pain and constipation. Genitourinary: Negative for dysuria, urgency, frequency, hematuria, flank pain, decreased urine volume, difficulty urinating and dyspareunia.  Musculoskeletal: Negative for back pain, joint swelling, arthralgias and gait problem. Neurological: Negative for dizziness, tremors, seizures, syncope, facial asymmetry, speech difficulty, weakness, light-headedness, numbness and headaches.  Hematological: Negative for adenopathy. Does not bruise/bleed easily. Psychiatric/Behavioral: Negative for hallucinations, behavioral problems, confusion, positive for depression and dysphoric mood, negative for decreased concentration and agitation.    Objective:   Filed Vitals:   01/13/15 1428  BP: 125/79  Pulse: 71  Temp: 98.4 F (36.9 C)  Resp: 16    Physical Exam: Constitutional: Patient appears well-developed and well-nourished. No distress. HENT: Normocephalic, atraumatic, External right and left ear normal. Oropharynx is clear and moist.  Eyes: Conjunctivae and EOM are normal. PERRLA, no scleral icterus. Neck: Normal ROM. Neck supple. No JVD. No tracheal deviation. No thyromegaly. CVS: RRR, S1/S2 +, no murmurs, no gallops, no carotid bruit.  Pulmonary: Effort and breath sounds normal, no stridor, rhonchi,  wheezes, rales.  Abdominal: Soft. BS +,  no distension, tenderness, rebound or guarding.  Musculoskeletal:  Normal range of motion. No edema and no tenderness.  Lymphadenopathy: No lymphadenopathy noted, cervical, inguinal or axillary Neuro: Alert. Normal reflexes, muscle tone coordination. No cranial nerve deficit. Skin: Skin is warm and dry. No rash noted. Not diaphoretic. No erythema. No pallor. Psychiatric: Depressed mood  Lab Results  Component Value Date   WBC 5.6 01/04/2015   HGB 11.2* 01/04/2015   HCT 35.3* 01/04/2015   MCV 85.7 01/04/2015   PLT 218 01/04/2015   Lab Results  Component Value Date   CREATININE 0.74 01/04/2015   BUN 10 01/04/2015   NA 137 01/04/2015   K 3.8 01/04/2015   CL 108 01/04/2015   CO2 23 01/04/2015    Lab Results  Component Value Date   HGBA1C 5.9* 10/29/2009   Lipid Panel     Component Value Date/Time   CHOL 193 01/04/2015 0419   TRIG 46 01/04/2015 0419   HDL 40* 01/04/2015 0419   CHOLHDL 4.8 01/04/2015 0419   VLDL 9 01/04/2015 0419   LDLCALC 144* 01/04/2015 0419       Assessment and plan:  Pancreatitis: Resolving  Advised to continue course of pantoprazole until she completes it and then discontinue it as she does not have symptoms of GERD or gastritis.  Constipation: Discussed dietary modifications, advised to increase fiber intake and water. Placed on lactulose to be used as needed.  Depression: Placed on Celexa. LCSW called in for counseling session with the patient.  This note has been created with Education officer, environmentalDragon speech recognition software and smart phrase technology. Any transcriptional errors are unintentional.         Jaclyn ShaggyEnobong, Amao, MD. [facility] [phone] 01/13/2015, 2:53 PM

## 2015-05-28 ENCOUNTER — Ambulatory Visit: Payer: Worker's Compensation

## 2015-05-28 ENCOUNTER — Ambulatory Visit (INDEPENDENT_AMBULATORY_CARE_PROVIDER_SITE_OTHER): Payer: Worker's Compensation | Admitting: Family Medicine

## 2015-05-28 VITALS — BP 126/72 | HR 62 | Temp 98.2°F | Resp 18 | Ht 62.21 in | Wt 162.0 lb

## 2015-05-28 DIAGNOSIS — M25561 Pain in right knee: Secondary | ICD-10-CM

## 2015-05-28 DIAGNOSIS — Z789 Other specified health status: Secondary | ICD-10-CM | POA: Diagnosis not present

## 2015-05-28 DIAGNOSIS — M25571 Pain in right ankle and joints of right foot: Secondary | ICD-10-CM | POA: Diagnosis not present

## 2015-05-28 DIAGNOSIS — S8011XA Contusion of right lower leg, initial encounter: Secondary | ICD-10-CM | POA: Diagnosis not present

## 2015-05-28 DIAGNOSIS — M79604 Pain in right leg: Secondary | ICD-10-CM

## 2015-05-28 DIAGNOSIS — S93401A Sprain of unspecified ligament of right ankle, initial encounter: Secondary | ICD-10-CM | POA: Diagnosis not present

## 2015-05-28 MED ORDER — MELOXICAM 7.5 MG PO TABS: 7.5000 mg | ORAL_TABLET | Freq: Every day | ORAL | Status: DC

## 2015-05-28 NOTE — Progress Notes (Signed)
Subjective:  By signing my name below, I, Stann Ore, attest that this documentation has been prepared under the direction and in the presence of Meredith Staggers, MD. Electronically Signed: Stann Ore, Scribe. 05/28/2015 , 3:50 PM .  Patient was seen in Room 1 .   Patient ID: Evelyn Dillon, female    DOB: 12/01/1960, 55 y.o.   MRN: [id number] Chief Complaint  Patient presents with  . Ankle Injury    right ankle, x 2 weeks ago, swelling   HPI Evelyn Dillon is a 55 y.o. female  Pt states that she fell at work 2 weeks ago, date of injury 05/14/15. She reports pain in the front of her right thigh, front of her knee, right lower leg, outside of her right foot and also her right ankle with some swelling. 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 She leaned forward to pick something up and the boxes bumped into her, causing her to fall forward. She also mentions striking something into her right knee when she fell. She's still able to ambulate, with some pain at work. She didn't come in earlier because she thought she would feel better over time.   No Known Allergies  Prior to Admission medications   Medication Sig Start Date End Date Taking? Authorizing Provider  citalopram (CELEXA) 20 MG tablet Take 1 tablet (20 mg total) by mouth daily. Patient not taking: Reported on 05/28/2015 01/13/15   Jaclyn Shaggy, MD  lactulose (CHRONULAC) 10 GM/15ML solution Take 15 mLs (10 g total) by mouth 2 (two) times daily as needed for mild constipation. Patient not taking: Reported on 05/28/2015 01/13/15   Jaclyn Shaggy, MD  pantoprazole (PROTONIX) 40 MG tablet Take 1 tablet (40 mg total) by mouth daily. Patient not taking: Reported on 05/28/2015 01/07/15   Asiyah Mayra Reel, MD   Review of Systems  Musculoskeletal: Positive for myalgias and arthralgias. Negative for back pain, joint swelling, gait problem, neck pain and neck stiffness.  Skin: Negative for rash and wound.  Neurological:  Negative for dizziness, weakness, light-headedness, numbness and headaches.       Objective:   Physical Exam  Constitutional: She is oriented to person, place, and time. She appears well-developed and well-nourished. No distress.  HENT:  Head: Normocephalic and atraumatic.  Eyes: EOM are normal. Pupils are equal, round, and reactive to light.  Neck: Neck supple.  Cardiovascular: Normal rate.   Pulmonary/Chest: Effort normal. No respiratory distress.  Musculoskeletal: Normal range of motion.  Lower extremities full strength with flexion and extension, tenderness over her lower tibia, fibula nontender, foot bones non tender including 5th metatarsal  jointlines nontender, soreness at anterior patellar and patellar tendon, no joint effusion; full rom of her right knee without any effusion, negative valgus and varus, negative mcmurray's, negative lachman, no bony tenderness of her right thigh; minimal tenderness at distal quadraceps  No calf pain, no achilles tenderness, minimal tenderness at the lateral ankle bilaterally, complains tenderness at malleolus, swelling over ankle (lateral greater than medial)  Neurological: She is alert and oriented to person, place, and time.  Skin: Skin is warm and dry.  Psychiatric: She has a normal mood and affect. Her behavior is normal.  Nursing note and vitals reviewed.  Filed Vitals:   05/28/15 1500  BP: 126/72  Pulse: 62  Temp: 98.2 F (36.8 C)  TempSrc: Oral  Resp: 18  Height: 5' 2.21" (1.58 m)  Weight: 162 lb (73.483 kg)  SpO2: 98%  Dg Tibia/fibula Right  05/28/2015  CLINICAL DATA:  Leg pain post fall at work, RIGHT leg pain, fell 2 weeks ago, tenderness along anterior knee, anterior lower tibia and fibula and at the medial and lateral aspects of RIGHT ankle, initial encounter EXAM: RIGHT TIBIA AND FIBULA - 2 VIEW COMPARISON:  None FINDINGS: Ankle joint alignment normal. Minimal joint space narrowing at medial compartment RIGHT knee. Osseous  mineralization grossly normal. No acute fracture, dislocation, or bone destruction. IMPRESSION: No acute osseous abnormalities. Electronically Signed   By: Ulyses Southward M.D.   On: 05/28/2015 16:23   Dg Ankle Complete Right  05/28/2015  CLINICAL DATA:  Ankle pain post fall at work, fell were 2 weeks ago, tenderness along anterior knee, anterior lower tibia and fibula, and at the medial and lateral aspects of RIGHT ankle, initial encounter EXAM: RIGHT ANKLE - COMPLETE 3+ VIEW COMPARISON:  None FINDINGS: Ankle mortise intact. Osseous mineralization normal. Mild soft tissue swelling at RIGHT ankle diffusely. No acute fracture, dislocation, or bone destruction. IMPRESSION: No acute osseous abnormalities. Electronically Signed   By: Ulyses Southward M.D.   On: 05/28/2015 16:25   Dg Knee Complete 4 Views Right  05/28/2015  CLINICAL DATA:  Fall at work.  Knee pain EXAM: RIGHT KNEE - COMPLETE 4+ VIEW COMPARISON:  None. FINDINGS: There is no evidence of fracture, dislocation, or joint effusion. There is no evidence of arthropathy or other focal bone abnormality. Soft tissues are unremarkable. IMPRESSION: Negative. Electronically Signed   By: Marlan Palau M.D.   On: 05/28/2015 16:24      Assessment & Plan:   Evelyn Dillon is a 55 y.o. female Right ankle pain - Plan: DG Ankle Complete Right, meloxicam (MOBIC) 7.5 MG tablet, Apply ASO ankle  Right knee pain - Plan: DG Knee Complete 4 Views Right, meloxicam (MOBIC) 7.5 MG tablet  Right leg pain - Plan: DG Tibia/Fibula Right, meloxicam (MOBIC) 7.5 MG tablet  Language barrier  Contusion of leg, right, multiple sites, initial encounter - Plan: meloxicam (MOBIC) 7.5 MG tablet  Right ankle sprain, initial encounter - Plan: meloxicam (MOBIC) 7.5 MG tablet, Apply ASO ankle  Contusion to right anterior lower leg, contusion to the anterior knee, and right ankle sprain after fall at work 2 weeks ago. Date of injury 05/14/15. No concerning findings on x-ray, a little  weight-bear without difficulty. Information given on contusion, brace for ankle, meloxicam 1 pill per day as needed. Recheck in 1 week. If not improved by that point, refer to orthopedics or physical therapy likely.  Spanish spoken and translator present. Understanding expressed. Note given for work restrictions.  Meds ordered this encounter  Medications  . meloxicam (MOBIC) 7.5 MG tablet    Sig: Take 1 tablet (7.5 mg total) by mouth daily.    Dispense:  30 tablet    Refill:  0   Patient Instructions       IF you received an x-ray today, you will receive an invoice from [facility]. Please contact [facility] at [phone] with questions or concerns regarding your invoice.   IF you received labwork today, you will receive an invoice from [employer]. Please contact [facility] at [phone] with questions or concerns regarding your invoice.   Our billing staff will not be able to assist you with questions regarding bills from these companies.  You will be contacted with the lab results as soon as they are available. The fastest way to get your results is to activate your  My Chart account. Instructions are located on the last page of this paperwork. If you have not heard from [place] regarding the results in 2 weeks, please contact this office.     meloxicam cada dia si necesario por dolor. No presion a area a dolor, use aparato en tobillo si necesario a trabaja.  ejercisios abajo. regrese en una semana, mas temprano empeorse.   Esguince de tobillo (Ankle Sprain)  Un esguince de tobillo es una lesin en los tejidos fuertes y fibrosos (ligamentos) que mantienen unidos los huesos de la articulacin del tobillo.  CAUSAS  Las causas pueden ser una cada o la torcedura del tobillo. Los esguinces de tobillo ocurren con ms frecuencia al pisar con el borde exterior del pie, lo que hace que el tobillo se vuelva hacia adentro. Las personas que practican  deportes son ms propensas a este tipo de lesiones.  SNTOMAS   Dolor en el tobillo. El dolor puede aparecer durante el reposo o slo al tratar de ponerse de pie o caminar.  Hinchazn.  Hematomas. Los hematomas pueden aparecer inmediatamente o luego de 1 a 2 das despus de la lesin.  Dificultad para pararse o caminar, especialmente al doblar en esquinas o al cambiar de direccin. DIAGNSTICO  El mdico le preguntar detalles acerca de la lesin y le har un examen fsico del tobillo para determinar si tiene un esguince. Durante el examen fsico, el mdico apretar y Contractor presin en reas especficas del pie y del tobillo. El mdico tratar de Licensed conveyancer tobillo en ciertas direcciones. Le indicarn una radiografa para descartar la fractura de un hueso o que un ligamento no se haya separado de uno de los huesos del tobillo (fractura por avulsin).  TRATAMIENTO  Algunos tipos de soporte podrn ayudarlo a estabilizar el tobillo. El profesional que lo asiste le dar las indicaciones. Tambin podr indicarle que use medicamentos para Primary school teacher. Si el esguince es grave, su mdico podr derivarlo a un cirujano que lo ayudar a Psychologist, occupational funcin de las partes afectadas del sistema esqueltico (ortopedista) o a un fisioterapeuta.  INSTRUCCIONES PARA EL CUIDADO EN EL HOGAR   Aplique hielo en la articulacin lesionada durante 1  2 das o segn lo que le indique su mdico. La aplicacin del hielo ayuda a reducir la inflamacin y Chief Technology Officer.  Ponga el hielo en una bolsa plstica.  Colquese una toalla entre la piel y la bolsa de hielo.  Deje el hielo en el lugar durante 15 a 20 minutos por vez, cada 2 horas mientras est despierto.  Slo tome medicamentos de venta libre o recetados para Primary school teacher, las molestias o bajar la fiebre segn las indicaciones de su mdico.  Eleve el tobillo lesionado por encima del nivel del corazn tanto como pueda durante 2 o 3 das.  Si su mdico le  indica el uso de [place], selas segn las instrucciones. Gradualmente lleve el peso sobre el tobillo [place]. Siga usando muletas o un bastn hasta que pueda caminar sin sentir dolor en el tobillo.  Si tiene una frula de yeso, sela como lo indique su mdico. No se apoye en ninguna cosa ms dura que una [employer] primeras 24 horas. No ponga peso sobre la frula. No permita que se moje. Puede quitrsela para tomar una ducha o un bao.  Pueden haberle colocado un vendaje elstico para usar alrededor del tobillo para darle soporte. Si el vendaje elstico est muy ajustado (siente adormecimiento u hormigueo o el pie est  fro y [place]azul), ajstelo para que sea ms cmodo.  Si usted tiene una frula de [place], puede soplar o dejar salir el aire para que sea ms cmodo. Puede quitarse la frula por la noche y antes de tomar una ducha o un bao. Mueva los dedos de los pies en la frula varias veces al da para disminuir la hinchazn. SOLICITE ATENCIN MDICA SI:   Le aumenta rpidamente el moretn o el hinchazn.  Los dedos de los pies estn extremadamente fros o pierde la sensibilidad en el pie.  El dolor no se alivia con los [employer]celmedicamentos. SOLICITE ATENCIN MDICA DE INMEDIATO SI:   Los dedos de los pies estn adormecidos o de Edison Internationalcolor azul.  Tiene un dolor agudo que va aumentando. ASEGRESE DE QUE:   Comprende estas instrucciones.  Controlar su enfermedad.  Solicitar ayuda de inmediato si no mejora o empeora.   Esta informacin no tiene Theme park managercomo fin reemplazar el consejo del mdico. Asegrese de hacerle al mdico cualquier pregunta que tenga.   Document Released: 02/28/2005 Document Revised: 11/23/2011 Elsevier Interactive Patient Education 2016 [employer]   Contusin (Contusion) Una contusin es un hematoma profundo. Las contusiones son el resultado de un traumatismo cerrado en los tejidos y las fibras musculares que estn debajo de la piel. La lesin causa una hemorragia  debajo de la piel. La The St. Paul Travelerspiel sobre la contusin puede tornarse de color [place]ul, morado o Mildredamarillo. Las lesiones menores causarn contusiones sin Engineer, miningdolor, Biomedical engineerpero las ms graves pueden presentar dolor e inflamacin durante un par de semanas.  CAUSAS  Generalmente, esta afeccin se debe a un golpe, un traumatismo o una fuerza directa en una zona del cuerpo. SNTOMAS  Los sntomas de esta afeccin incluyen lo siguiente:  Hinchazn de la zona lesionada.  Dolor y sensibilidad en la zona de la lesin.  Cambio de color. La zona puede enrojecerse y Enbridge Energyluego ponerse azul, [place] Havenmorada o [place]amarilla. DIAGNSTICO  Esta afeccin se diagnostica en funcin de un examen fsico y de la historia clnica. Puede ser necesario hacer una radiografa, una tomografa computarizada (TC) o una resonancia magntica (RM) para determinar si hubo lesiones asociadas, como huesos rotos (fracturas). TRATAMIENTO  El tratamiento especfico de esta afeccin depender de la zona del cuerpo donde se produjo la lesin. En general, el mejor tratamiento para una contusin es el reposo, la aplicacin de hielo, la compresin y la elevacin de la zona de la lesin. Generalmente, esto se conoce como la estrategia de RHCE. Para Human resources officercontrolar el dolor, tambin pueden recomendarle antiinflamatorios de [place] Fallsventa libre.  INSTRUCCIONES PARA EL CUIDADO EN EL HOGAR   Mantenga la zona de la lesin en reposo.  Si se lo indican, aplique hielo sobre la zona lesionada:  Ponga el hielo en una bolsa plstica.  Coloque una toalla entre la piel y la bolsa de hielo.  Coloque el hielo durante 20minutos, 2 a 3veces por Futures traderda.  Si se lo indican, ejerza una compresin suave en la zona de la lesin con una venda elstica. Asegrese de que la venda no est [place] Islandsmuy ajustada. Early OsmondQutese y vuelva a colocarse la venda como se lo haya indicado el mdico.  Cuando est sentado o acostado, eleve la zona de la lesin por encima del nivel del corazn, si es posible.  Tome los medicamentos de venta  libre y los recetados solamente como se lo haya indicado el mdico. SOLICITE ATENCIN MDICA SI:  Los sntomas no mejoran despus de [address]varios das de Grindstonetratamiento.  Los sntomas empeoran.  Tiene dificultad para mover la  zona lesionada. SOLICITE ATENCIN MDICA DE INMEDIATO SI:   Siente dolor intenso.  Siente adormecimiento en una mano o un pie.  La mano o el pie estn plidos o fros.   Esta informacin no tiene Theme park manager el consejo del mdico. Asegrese de hacerle al mdico cualquier pregunta que tenga.   Document Released: 12/08/2004 Document Revised: 11/19/2014 Elsevier Interactive Patient Education [employer]     I personally performed the services described in this documentation, which was scribed in my presence. The recorded information has been reviewed and considered, and addended by me as needed.

## 2015-05-28 NOTE — Patient Instructions (Addendum)
IF you received an x-ray today, you will receive an invoice from [facility]. Please contact [facility] at [phone] with questions or concerns regarding your invoice.   IF you received labwork today, you will receive an invoice from [employer]. Please contact [facility] at [phone] with questions or concerns regarding your invoice.   Our billing staff will not be able to assist you with questions regarding bills from these companies.  You will be contacted with the lab results as soon as they are available. The fastest way to get your results is to activate your My Chart account. Instructions are located on the last page of this paperwork. If you have not heard from [place] regarding the results in 2 weeks, please contact this office.     meloxicam cada dia si necesario por dolor. No presion a area a dolor, use aparato en tobillo si necesario a trabaja.  ejercisios abajo. regrese en una semana, mas temprano empeorse.   Esguince de tobillo (Ankle Sprain)  Un esguince de tobillo es una lesin en los tejidos fuertes y fibrosos (ligamentos) que mantienen unidos los huesos de la articulacin del tobillo.  CAUSAS  Las causas pueden ser una cada o la torcedura del tobillo. Los esguinces de tobillo ocurren con ms frecuencia al pisar con el borde exterior del pie, lo que hace que el tobillo se vuelva hacia adentro. Las personas que practican deportes son ms propensas a este tipo de lesiones.  SNTOMAS   Dolor en el tobillo. El dolor puede aparecer durante el reposo o slo al tratar de ponerse de pie o caminar.  Hinchazn.  Hematomas. Los hematomas pueden aparecer inmediatamente o luego de 1 a 2 das despus de la lesin.  Dificultad para pararse o caminar, especialmente al doblar en esquinas o al cambiar de direccin. DIAGNSTICO  El mdico le preguntar detalles acerca de la lesin y le har un examen fsico del tobillo para determinar si  tiene un esguince. Durante el examen fsico, el mdico apretar y Contractor presin en reas especficas del pie y del tobillo. El mdico tratar de Licensed conveyancer tobillo en ciertas direcciones. Le indicarn una radiografa para descartar la fractura de un hueso o que un ligamento no se haya separado de uno de los huesos del tobillo (fractura por avulsin).  TRATAMIENTO  Algunos tipos de soporte podrn ayudarlo a estabilizar el tobillo. El profesional que lo asiste le dar las indicaciones. Tambin podr indicarle que use medicamentos para Primary school teacher. Si el esguince es grave, su mdico podr derivarlo a un cirujano que lo ayudar a Psychologist, occupational funcin de las partes afectadas del sistema esqueltico (ortopedista) o a un fisioterapeuta.  INSTRUCCIONES PARA EL CUIDADO EN EL HOGAR   Aplique hielo en la articulacin lesionada durante 1  2 das o segn lo que le indique su mdico. La aplicacin del hielo ayuda a reducir la inflamacin y Chief Technology Officer.  Ponga el hielo en una bolsa plstica.  Colquese una toalla entre la piel y la bolsa de hielo.  Deje el hielo en el lugar durante 15 a 20 minutos por vez, cada 2 horas mientras est despierto.  Slo tome medicamentos de venta libre o recetados para Primary school teacher, las molestias o bajar la fiebre segn las indicaciones de su mdico.  Eleve el tobillo lesionado por encima del nivel del corazn tanto como pueda durante 2 o 3 das.  Si su mdico le indica el uso de [place], selas segn las instrucciones.  Gradualmente lleve el peso sobre el tobillo [place]. Siga usando muletas o un bastn hasta que pueda caminar sin sentir dolor en el tobillo.  Si tiene una frula de yeso, sela como lo indique su mdico. No se apoye en ninguna cosa ms dura que una [employer] primeras 24 horas. No ponga peso sobre la frula. No permita que se moje. Puede quitrsela para tomar una ducha o un bao.  Pueden haberle colocado un vendaje elstico para usar alrededor del  tobillo para darle soporte. Si el vendaje elstico est muy ajustado (siente adormecimiento u hormigueo o el pie est fro y [place]), ajstelo para que sea ms cmodo.  Si usted tiene una frula de [place], puede soplar o dejar salir el aire para que sea ms cmodo. Puede quitarse la frula por la noche y antes de tomar una ducha o un bao. Mueva los dedos de los pies en la frula varias veces al da para disminuir la hinchazn. SOLICITE ATENCIN MDICA SI:   Le aumenta rpidamente el moretn o el hinchazn.  Los dedos de los pies estn extremadamente fros o pierde la sensibilidad en el pie.  El dolor no se alivia con los [employer]. SOLICITE ATENCIN MDICA DE INMEDIATO SI:   Los dedos de los pies estn adormecidos o de [employer].  Tiene un dolor agudo que va aumentando. ASEGRESE DE QUE:   Comprende estas instrucciones.  Controlar su enfermedad.  Solicitar ayuda de inmediato si no mejora o empeora.   Esta informacin no tiene Theme park manager el consejo del mdico. Asegrese de hacerle al mdico cualquier pregunta que tenga.   Document Released: 02/28/2005 Document Revised: 11/23/2011 Elsevier Interactive Patient Education 2016 [employer].   Contusin (Contusion) Una contusin es un hematoma profundo. Las contusiones son el resultado de un traumatismo cerrado en los tejidos y las fibras musculares que estn debajo de la piel. La lesin causa una hemorragia debajo de la piel. La [employer] contusin puede tornarse de color [place], morado o [place]. Las lesiones menores causarn contusiones sin Engineer, mining, Biomedical engineer las ms graves pueden presentar dolor e inflamacin durante un par de semanas.  CAUSAS  Generalmente, esta afeccin se debe a un golpe, un traumatismo o una fuerza directa en una zona del cuerpo. SNTOMAS  Los sntomas de esta afeccin incluyen lo siguiente:  Hinchazn de la zona lesionada.  Dolor y sensibilidad en la zona de la lesin.  Cambio de color. La zona  puede enrojecerse y [employer], [place] o [place]. DIAGNSTICO  Esta afeccin se diagnostica en funcin de un examen fsico y de la historia clnica. Puede ser necesario hacer una radiografa, una tomografa computarizada (TC) o una resonancia magntica (RM) para determinar si hubo lesiones asociadas, como huesos rotos (fracturas). TRATAMIENTO  El tratamiento especfico de esta afeccin depender de la zona del cuerpo donde se produjo la lesin. En general, el mejor tratamiento para una contusin es el reposo, la aplicacin de hielo, la compresin y la elevacin de la zona de la lesin. Generalmente, esto se conoce como la estrategia de RHCE. Para Human resources officer, tambin pueden recomendarle antiinflamatorios de [place].  INSTRUCCIONES PARA EL CUIDADO EN EL HOGAR   Mantenga la zona de la lesin en reposo.  Si se lo indican, aplique hielo sobre la zona lesionada:  Ponga el hielo en una bolsa plstica.  Coloque una toalla entre la piel y la bolsa de hielo.  Coloque el hielo durante , 2 a 3veces por Futures trader.  Si se  lo indican, ejerza una compresin suave en la zona de la lesin con una venda elstica. Asegrese de que la venda no est [place] Islandsmuy ajustada. Early OsmondQutese y vuelva a colocarse la venda como se lo haya indicado el mdico.  Cuando est sentado o acostado, eleve la zona de la lesin por encima del nivel del corazn, si es posible.  Tome los medicamentos de venta libre y los recetados solamente como se lo haya indicado el mdico. SOLICITE ATENCIN MDICA SI:  Los sntomas no mejoran despus de [address]varios das de Cadwelltratamiento.  Los sntomas empeoran.  Tiene dificultad para mover la zona lesionada. SOLICITE ATENCIN MDICA DE INMEDIATO SI:   Siente dolor intenso.  Siente adormecimiento en una mano o un pie.  La mano o el pie estn plidos o fros.   Esta informacin no tiene Theme park managercomo fin reemplazar el consejo del mdico. Asegrese de hacerle al mdico cualquier pregunta que  tenga.   Document Released: 12/08/2004 Document Revised: 11/19/2014 Elsevier Interactive Patient Education [employer] Inc2016 Elsevier Inc.

## 2015-06-04 ENCOUNTER — Ambulatory Visit (INDEPENDENT_AMBULATORY_CARE_PROVIDER_SITE_OTHER): Payer: Worker's Compensation | Admitting: Family Medicine

## 2015-06-04 VITALS — BP 118/68 | HR 66 | Temp 98.8°F | Resp 16 | Ht 62.0 in | Wt 161.0 lb

## 2015-06-04 DIAGNOSIS — S8001XD Contusion of right knee, subsequent encounter: Secondary | ICD-10-CM | POA: Diagnosis not present

## 2015-06-04 DIAGNOSIS — S96911D Strain of unspecified muscle and tendon at ankle and foot level, right foot, subsequent encounter: Secondary | ICD-10-CM | POA: Diagnosis not present

## 2015-06-04 DIAGNOSIS — Y990 Civilian activity done for income or pay: Secondary | ICD-10-CM

## 2015-06-04 NOTE — Patient Instructions (Addendum)
IF you received an x-ray today, you will receive an invoice from [facility]. Please contact [facility] at [phone] with questions or concerns regarding your invoice.   IF you received labwork today, you will receive an invoice from [employer]. Please contact [facility] at [phone] with questions or concerns regarding your invoice.   Our billing staff will not be able to assist you with questions regarding bills from these companies.  You will be contacted with the lab results as soon as they are available. The fastest way to get your results is to activate your My Chart account. Instructions are located on the last page of this paperwork. If you have not heard from [place] regarding the results in 2 weeks, please contact this office.    Esguince agudo de tobillo, con rehabilitacin fase I (Acute Ankle Sprain With Phase I Rehab)  El esguince agudo de tobillo es un desgarro parcial o completo de uno o ms ligamentos debido a una lesin por traumatismo. La gravedad de la lesin depende de la cantidad de ligamentos esguinzados y el grado del esguince. Hay 3 categoras de esguinces.  El [place] 1 es un esguince leve. Hay un ligero estiramiento sin ruptura evidente. No hay prdida de fuerza, y [address] y el ligamento tienen el largo correcto.  El [place] 2 es un esguince moderado. Hay ruptura de las fibras que se encuentran dentro de la sustancia del ligamento, Medical laboratory scientific officer en que [employer] huesos o Database administrator. El largo del ligamento est aumentado y generalmente hay disminucin de la fuerza.  Un esguince en grado 3 es la ruptura completa del tendn y no es frecuente. Adems del grado del esguince, hay tres tipos de esguince de tobillo. Esguince lateral de tobillo: Ocurre en uno o ms ligamentos de la parte externa (lateral) del tobillo. Aqu se producen las lesiones con ms frecuencia. Esguince interno o medial de tobillo: Hay un ligamento  triangular en la parte interna (media) del tobillo que es susceptible a lesiones. El esguince medial de tobillo es menos comn. Esguince de sindesmosis "tobillo superior": La sindesmosis es un ligamento que [employer] de la parte inferior de la pierna. El esguince de sindesmosis suele ocurrir slo con esguinces muy graves del [place]. SNTOMAS  Dolor, sensibilidad e hinchazn en el tobillo, que comienza en el lado de la lesin y que con el tiempo puede avanzar hacia todo el tobillo y el pie.  Sensacin de estallido o ruptura en el momento de la lesin.  Hematoma que puede extenderse hasta el taln.  Imposibilidad de caminar poco despus de producida la lesin. CAUSAS  Los esguinces agudos de tobillo se deben a una presin ejercida temporalmente sobre el hueso del tobillo que saca el astrgalo de su ubicacin normal.  Distensin o ruptura de los ligamentos que normalmente sostienen la articulacin en su lugar (generalmente debido a una torcedura). LOS RIESGOS AUMENTAN CON:  Esguince previo del tobillo.  Actividades en las que el pie se apoya de manera inadecuada (como en el bsquet, el vley y el ftbol) o caminar o correr sobre superficies desparejas o rugosas.  Zapatos sin soporte adecuado que [employer] lados cuando se produce la presin.  Poca fuerza y flexibilidad.  Dificultad para mantener el equilibrio.  Deportes de contacto. PREVENCIN  Precalentamiento adecuado y elongacin antes de la [place].  Mantener la forma fsica:  Flexibilidad del tobillo y la pierna, fuerza y resistencia muscular.  Capacidad cardiovascular.  Actividades que mejoren el equilibrio.  Usar la tcnica correcta y Warehouse managertener un entrenador que corrija la tcnica incorrecta.  Uso de Qatarcinta adhesiva, vendajes, tobilleras o botines que Avayaeviten las lesiones. En un comienzo puede utilizarse Qatarcinta adhesiva; sin embargo pierde su capacidad de sostn a los 10  15  minutos.  Utilice zapatos protectores adecuados (los botines combinados con vendajes o sujetadores es ms efectivo que el uso de slo uno de ellos).  Durante los 12 meses posteriores a la lesin, proteja el tobillo durante la prctica de deportes y Peraltaotras actividades. PRONSTICO  Si se trata adecuadamente, el esguince de tobillo podr curarse por completo; sin embargo, el tiempo de recuperacin depende del grado del esguince.  Un esguince de grado 1 est lo suficientemente curado The Krogerentre los 5 y [address] das como para permitir realizar actividades modificadas y requiere un promedio de 6 semanas para curarse completamente.  Los esguinces de grado 2 necesitan entre 6 y 10 semanas para curarse completamente.  Los esguinces de grado 3 necesitan entre 12 y 16 semanas para curarse.  Un esguince de sindesmosis habitualmente demora ms de 3 meses en curarse. COMPLICACIONES RELACIONADAS  La recurrencia frecuente de los sntomas puede dar como resultado un problema crnico. Un tratamiento adecuado del problema la primera vez que ocurre, disminuye la frecuencia de recurrencias y optimiza el tiempo de curacin. La gravedad del esguince inicial no predice la probabilidad de inestabilidad futura.  Lesiones en otras estructuras, (hueso, cartlago o tendn).  Si se repiten los esguinces puede dar lugar a una articulacin crnicamente inestable o artrtica. TRATAMIENTO El tratamiento inicial consiste en la toma de medicamentos y la aplicacin de hielo y vendas por compresin para Engineer, materialsaliviar el dolor y reducir la hinchazn. El esguince de tobillo suele inmovilizarse con un yeso o bota para permitir la curacin. Podrn recomendarse muletas para reducir la presin en la lesin. Luego de la inmovilizacin podr ser Passenger transport managernecesario realizar ejercicios de fortalecimiento y estiramiento para ganar fuerza y un rango completo de Rossmovimiento. No es frecuente la ciruga para tratar el esguince de tobillo. MEDICAMENTOS  Generalmente se  indican antiinflamatorios no esteroides como la aspirina o el ibuprofeno (no los tome durante los 3 [address] posteriores a la lesin ni dentro de los 7 [address]  [address] previos a la Azerbaijanciruga), u otros calmantes menores como acetaminofeno. Tome los [employer]celmedicamentos como le indic su mdico. Comunquese inmediatamente con el mdico si tiene alguna hemorragia, molestias estomacales o seales de una reaccin alrgica como consecuencia de estos medicamentos.  Podr beneficiarse con ungentos o linimentos tpicos.  Cuando lo necesite, Engineer, building servicesel profesional le prescribir calmantes. No tome medicamentos recetados para el dolor durante ms de 4 a 7 das. Utilcelos como se le indique y slo cuando lo necesite. CALOR Y FRO  El fro se [place]) Islandsutiliza para Engineer, materialsaliviar el dolor y reducir la inflamacin para casos agudos y crnicos. El fro debe aplicarse durante 10 a 15 minutos cada 2  3 horas para reducir la inflamacin y Chief Technology Officerel dolor e inmediatamente despus de cualquier actividad que agrava los sntomas. Utilice bolsas o un masaje de hielo.  El calor debe utilizarse antes de Education officer, environmentalrealizar ejercicios de estiramiento y fortalecimiento prescriptos por el mdico. Utilice una bolsa trmica o un pao hmedo. SOLICITE ATENCIN MDICA DE INMEDIATO SI:   Tuviera dolor, hinchazn o hematomas que empeoran a pesar del tratamiento.  Siente dolor, adormecimiento cambios en el color o fro en el pie o en los dedos.  Aparecen sntomas nuevos e inesperados (las drogas utilizadas en el tratamiento pueden  producir efectos secundarios). EJERCICIOS  EJERCICIOS FASE I EJERCICIOS DE AMPLITUD DE MOVIMIENTOS Y ELONGACIN Esguince de tobillo agudo, fase I, semanas 1 a 2 Estos ejercicios le ayudarn al comienzo de la rehabilitacin de la lesin. Estos ejercicios generalmente se realizan durante las primeras 1  2 semanas luego de la lesin. Un vez que el mdico, fisioterapeuta o Herbalist vea un progreso adecuado, Licensed conveyancer con los ejercicios. Al completar estos ejercicios,  recuerde:   Restaurar la flexibilidad del tejido ayuda a que las articulaciones recuperen el movimiento normal. Esto permite que el movimiento y la actividad sea ms saludables y menos dolorosos.  Para que sea efectiva, cada elongacin debe realizarse durante al menos 30 segundos.  El estiramiento nunca debe debe ser doloroso. Deber sentir slo un alargamiento suave o estiramiento del tejido que [place]. AMPLITUD DE MOVIMIENTOS Flexin dorso plantar  Mientras est entado con la rodilla derecha / izquierdo derecha, lleve la parte superior del pie hacia arriba flexionando el tobillo. Luego realice [employer], y apunte con los pies [place].  Mantenga esta posicin durante __________ segundos.  Luego de completar la primera serie de ejercicios, reptalo con la rodilla flexionada. Reptalo __________ veces. Realice este estiramiento __________ Anthoney Harada por da.  AMPLITUD DE MOVIMIENTOS Alfabeto con el tobillo  Imagine que el dedo gordo del pie derecha / izquierdo es un lpiz.  Con la cadera y la rodilla quietas, escriba todo el alfabeto con el "lpiz". Llegue hasta donde pueda, sin que aumenten las [place]. Reptalo __________ veces. Realice este estiramiento __________ Anthoney Harada por da.  EJERCICIOS DE FORTALECIMIENTO Esguince de tobillo agudo, fase I, semanas 1 a 2 Estos ejercicios le ayudarn al comienzo de la rehabilitacin de la fuerza del tobillo. Estos ejercicios generalmente se realizan durante las primeras 1  2 semanas luego de la lesin. Un vez que el mdico, fisioterapeuta o Herbalist vea un progreso adecuado, Licensed conveyancer con los ejercicios. Al completar estos ejercicios, recuerde:   Los msculos pueden ganar tanto la resistencia como la fortaleza que necesita para sus actividades diarias a travs de ejercicios controlados.  Realice los ejercicios como se lo indic el mdico, el fisioterapeuta o Orthoptist. Aumente la resistencia y repeticiones segn se le haya  indicado.  Podr experimentar dolor o cansancio muscular, pero el dolor o molestia que trata de eliminar a travs de los ejercicios nunca debe empeorar. Si el dolor empeora, detngase y asegrese de que est siguiendo las directivas correctamente. Si an siente dolor luego de Education officer, environmental lo ajustes necesarios, deber discontinuar el ejercicio hasta que pueda conversar con el profesional sobre el problema. FUERZA Dorsiflexores  Asegure una banda de goma para ejercicios a un objeto fijo (ej, mesa, palo) y enrosque el otro extremo alrededor del Secretary/administrator / izquierdo.  Sintese en el suelo de frente al objeto fijo. La banda debe estar ligeramente tensa cuando su pie est relajado.  Lentamente, lleve el pie hacia atrs con el tobillo y los dedos del pie.  Mantenga esta posicin durante __________ segundos. Libere la tensin de la banda lentamente y coloque el pie en la posicin inicial. Reptalo __________ veces. Realice este estiramiento __________ Anthoney Harada por da.  FUERZA Flexin plantar  Sintese con la pierna derecha / izquierdo extendida. Sostenga por los extremos una banda de goma para ejercicios y colquela alrededor de la regin metatarsiana del pie. Mantenga una tensin suave en la banda.  Empuje los dedos del pie lentamente, hacia el lado contrario a usted, que apunten [place].  Mantenga esta posicin durante __________  segundos. Vuelva lentamente a la posicin normal, y [place] una tensin en la banda. Reptalo __________ veces. Realice este estiramiento __________ Anthoney Harada por da.  FUERZA Eversin del tobillo  Asegure un extremo de una banda de goma para ejercicios a un objeto fijo (mesa, palo). Ate el extremo opuesto a su pie, justo antes de los dedos.  Coloque los puos [place] rodillas. Esto har que la fuerza se concentre en el tobillo.  Lleve la [employer] contrario, lentamente, para que el dedo pequeo del pie salga apunte [place] arriba y [place]. Asegrese de  que la banda est posicionada de tal forma que puede resistir el movimiento completo.  Mantenga esta posicin durante __________ segundos.  Haga que los msculos resistan la banda mientras tira lentamente el pie hacia atrs hasta la posicin inicial. Reptalo __________ veces. Realice este estiramiento __________ Anthoney Harada por da.  FUERZA - Inversin del tobillo  Asegure un extremo de una banda de goma para ejercicios a un objeto fijo (mesa, palo). Ate el extremo opuesto a su pie, justo antes de los dedos.  Coloque los [place] rodillas. Esto har que la fuerza se concentre en el tobillo.  Lentamente, tire del dedo gordo [place] y Honor Junes y asegrese de que la banda est posicionada de tal forma que puede resistir el movimiento completo.  Mantenga esta posicin durante __________ segundos.  Haga que los msculos resistan la banda mientras tira lentamente el pie hacia atrs hasta la posicin inicial. Reptalo __________ veces. Realice este ejercicio __________ veces por da.  FUERZA Rollo de toalla  Sintese en una silla en una superficie no alfombrada.  Coloque el pie derecha / izquierdo en [address], y mantenga el taln en el suelo.  Coloque la toalla alrededor del taln pero envuelva slo los dedos del pie. Mantenga el taln contra el piso.  Aada peso al extremo de la toalla si el mdico, fisioterapeuta o entrenador se lo indican. Reptalo __________ veces. Realice este estiramiento __________ Anthoney Harada por da.   Esta informacin no tiene Theme park manager el consejo del mdico. Asegrese de hacerle al mdico cualquier pregunta que tenga.   Document Released: 12/15/2005 Document Revised: 03/21/2014 Elsevier Interactive Patient Education [employer] Inc.

## 2015-06-04 NOTE — Progress Notes (Signed)
Subjective:    Patient ID: Evelyn Dillon, female    DOB: 06-Dec-1960, 55 y.o.   MRN: [id number]  HPI This 55 y.o. female presents for two week follow-up of Workman's Compensation injury to ankle.  Used interpreter form to obtain history.  Doing well; no light duty available so took a week of vacation. Pain and swelling have improved since not on feet all day.  Decrease in ankle pain and swelling. Decrease in knee pain.  Pain in knee and ankle only occurs with stairs.  No giving out or popping of knee.  No swelling of knee.  Lateral ankle pain improved; swelling decreased; wearing lace up ankle brace throughout the day.  Denies n/t/burning in RLE.  Evaluated on 05/28/15 by Dr. Neva Seat for R knee and ankle pain/contusion and strain. Original injury occurred 05/14/15.  Pain in front of R thigh, anterior knee, R lower leg, lateral aspet of foot and R lateral ankle with swelling.  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  She also hit her R knee when she fell.   She was placed in an ASO brace and prescribed Meloxicam for inflammation.  Placed on light duty; to avoid climbing ladders, prolonged standing, kneeling; advised to avoid direct pressure to knee, leg, or ankle.     Review of Systems  Constitutional: Negative for fever, chills, diaphoresis and fatigue.  Musculoskeletal: Positive for joint swelling, arthralgias and gait problem.  Skin: Negative for wound.  Neurological: Negative for weakness and numbness.       Objective:   Physical Exam  Constitutional: She is oriented to person, place, and time. She appears well-developed and well-nourished. No distress.  HENT:  Head: Normocephalic and atraumatic.  Eyes: Conjunctivae are normal. Pupils are equal, round, and reactive to light.  Neck: Normal range of motion. Neck supple.  Cardiovascular: Normal rate, regular rhythm and normal heart sounds.  Exam reveals no  gallop and no friction rub.   No murmur heard. Pulmonary/Chest: Effort normal and breath sounds normal. She has no wheezes. She has no rales.  Musculoskeletal:       Right knee: Normal. She exhibits normal range of motion, no swelling, no bony tenderness and normal meniscus. No tenderness found. No medial joint line, no lateral joint line, no MCL, no LCL and no patellar tendon tenderness noted.       Right ankle: She exhibits normal range of motion, no ecchymosis and no deformity. No tenderness. No lateral malleolus, no medial malleolus, no head of 5th metatarsal and no proximal fibula tenderness found. Achilles tendon normal. Achilles tendon exhibits no pain, no defect and normal Thompson's test results.       Right lower leg: She exhibits tenderness. She exhibits no bony tenderness, no swelling and no edema.       Legs:      Right foot: Normal. There is normal range of motion, no tenderness, no bony tenderness and no swelling.  R ankle: mild swelling lateral ankle; FULL ROM of R ankle without pain; anterior drawer sign negative.   R Foot:  Non-tender to palpation; no swelling.   Neurological: She is alert and oriented to person, place, and time.  Skin: She is not diaphoretic.  Psychiatric: She has a normal mood and affect. Her behavior is normal.  Nursing note and vitals reviewed.       Assessment & Plan:   1. Right ankle strain, subsequent  encounter   2. Knee contusion, right, subsequent encounter   3. Work related injury    -improving. -return to regular duty on Monday, June 08, 2015; continue to wear lace up ankle brace. -RTC PRN only for persistent or worsening pain. -benign exam in office.  No orders of the defined types were placed in this encounter.     Kristi Paulita FujitaMartin Smith, M.D. [facility]  [place] [address] [place], [place]  [id number] [phone] phone [phone]) [phone] fax

## 2016-02-10 ENCOUNTER — Encounter (HOSPITAL_COMMUNITY): Payer: Self-pay | Admitting: *Deleted

## 2016-02-10 ENCOUNTER — Inpatient Hospital Stay (HOSPITAL_COMMUNITY)
Admission: EM | Admit: 2016-02-10 | Discharge: 2016-02-13 | DRG: 440 | Disposition: A | Discharge status: Home / Self Care | Payer: Self-pay | Attending: Family Medicine | Admitting: Family Medicine

## 2016-02-10 DIAGNOSIS — R001 Bradycardia, unspecified: Secondary | ICD-10-CM | POA: Diagnosis present

## 2016-02-10 DIAGNOSIS — Z833 Family history of diabetes mellitus: Secondary | ICD-10-CM

## 2016-02-10 DIAGNOSIS — K859 Acute pancreatitis without necrosis or infection, unspecified: Secondary | ICD-10-CM

## 2016-02-10 DIAGNOSIS — R1010 Upper abdominal pain, unspecified: Secondary | ICD-10-CM | POA: Diagnosis present

## 2016-02-10 DIAGNOSIS — K861 Other chronic pancreatitis: Principal | ICD-10-CM | POA: Diagnosis present

## 2016-02-10 DIAGNOSIS — Z8249 Family history of ischemic heart disease and other diseases of the circulatory system: Secondary | ICD-10-CM

## 2016-02-10 DIAGNOSIS — Z791 Long term (current) use of non-steroidal anti-inflammatories (NSAID): Secondary | ICD-10-CM

## 2016-02-10 DIAGNOSIS — Z23 Encounter for immunization: Secondary | ICD-10-CM

## 2016-02-10 DIAGNOSIS — IMO0002 Reserved for concepts with insufficient information to code with codable children: Secondary | ICD-10-CM

## 2016-02-10 HISTORY — DX: Acute pancreatitis without necrosis or infection, unspecified: K85.90

## 2016-02-10 LAB — COMPREHENSIVE METABOLIC PANEL
ALT: 27 U/L (ref 14–54)
AST: 29 U/L (ref 15–41)
Albumin: 4.1 g/dL (ref 3.5–5.0)
Alkaline Phosphatase: 83 U/L (ref 38–126)
Anion gap: 9 (ref 5–15)
BILIRUBIN TOTAL: 0.4 mg/dL (ref 0.3–1.2)
BUN: 14 mg/dL (ref 6–20)
CHLORIDE: 109 mmol/L (ref 101–111)
CO2: 21 mmol/L — ABNORMAL LOW (ref 22–32)
CREATININE: 0.76 mg/dL (ref 0.44–1.00)
Calcium: 9 mg/dL (ref 8.9–10.3)
GFR calc Af Amer: >60 mL/min (ref >60)
Glucose, Bld: 121 mg/dL — ABNORMAL HIGH (ref 65–99)
Potassium: 3.5 mmol/L (ref 3.5–5.1)
Sodium: 139 mmol/L (ref 135–145)
TOTAL PROTEIN: 7 g/dL (ref 6.5–8.1)

## 2016-02-10 LAB — CBC
HCT: 37.9 % (ref 36.0–46.0)
Hemoglobin: 12.6 g/dL (ref 12.0–15.0)
MCH: 27.9 pg (ref 26.0–34.0)
MCHC: 33.2 g/dL (ref 30.0–36.0)
MCV: 84 fL (ref 78.0–100.0)
PLATELETS: 255 10*3/uL (ref 150–400)
RBC: 4.51 MIL/uL (ref 3.87–5.11)
RDW: 13.6 % (ref 11.5–15.5)
WBC: 6.9 10*3/uL (ref 4.0–10.5)

## 2016-02-10 LAB — LIPASE, BLOOD: Lipase: >3000 U/L — ABNORMAL HIGH (ref 11–51)

## 2016-02-10 MED ORDER — MORPHINE SULFATE (PF) 4 MG/ML IV SOLN
4.0000 mg | Freq: Once | INTRAVENOUS | Status: AC
  Administered 2016-02-11: 4 mg via INTRAVENOUS
  Filled 2016-02-10: qty 1

## 2016-02-10 MED ORDER — ONDANSETRON 4 MG PO TBDP
4.0000 mg | ORAL_TABLET | Freq: Once | ORAL | Status: AC | PRN
Start: 2016-02-10 — End: 2016-02-10
  Administered 2016-02-10: 4 mg via ORAL
  Filled 2016-02-10: qty 1

## 2016-02-10 MED ORDER — ONDANSETRON HCL 4 MG/2ML IJ SOLN
4.0000 mg | Freq: Once | INTRAMUSCULAR | Status: AC
  Administered 2016-02-11: 4 mg via INTRAVENOUS
  Filled 2016-02-10: qty 2

## 2016-02-10 NOTE — ED Triage Notes (Signed)
Pt reports upper abd pain that radiates around left side and into her back x 1 week. Has some difficulty urinating and reports n/v.

## 2016-02-10 NOTE — ED Notes (Signed)
Pt unable to obtain urine sample at triage. 

## 2016-02-10 NOTE — ED Provider Notes (Signed)
[facility]-EMERGENCY DEPT Provider Note   CSN: [id number] Arrival date & time: 02/10/16  1703     History   Chief Complaint Chief Complaint  Patient presents with  . Abdominal Pain  . Back Pain    HPI Evelyn Dillon is a 55 y.o. female with a hx of pancreatitis, anemia presents to the Emergency Department complaining of gradual, persistent, progressively worsening epigastric abd pain onset today after lunch around 2pm.  Pt reports the pain radiates around the sides of her abd and then into the back.  Pt describes the pain as very intense and sharp.  Pt with reports associated nausea and 2 episodes of NBNB.  Pt reports a previous episode of similar pain several years ago and was [facility].  Denies EtOH, tobacco and drug usage.   No aggravating or alleviating factors.  No previous abd surgeries aside from a BTL.  No urinary or vaginal symptoms.        The history is provided by the patient and medical records. The history is limited by a language barrier. A language interpreter was used.    Past Medical History:  Diagnosis Date  . Kidney infection   . Pancreatitis   . Syncope    a. 04/2014: tele unrevealing, echo with normal EF & grade 2 DD, normal nuc. Etiology not clear.    Patient Active Problem List   Diagnosis Date Noted  . Major depressive disorder, recurrent episode (HCC) 01/13/2015  . Epigastric pain   . Pancreatitis 01/04/2015  . Upper abdominal pain   . Hypokalemia   . Facial trauma 05/07/2014  . Left arm pain 05/07/2014  . Syncope 05/06/2014  . ANEMIA, IRON DEFICIENCY, UNSPEC. 05/11/2006    Past Surgical History:  Procedure Laterality Date  . No prior surgery      OB History    No data available       Home Medications    Prior to Admission medications   Medication Sig Start Date End Date Taking? Authorizing Provider  meloxicam (MOBIC) 7.5 MG tablet Take 1 tablet (7.5 mg total) by mouth daily. 05/28/15   Shade FloodJeffrey R Greene, MD    Family  History Family History  Problem Relation Age of Onset  . Diabetes Mellitus I Sister   . Heart disease Sister     Died at 5038    Social History Social History  Substance Use Topics  . Smoking status: Never Smoker  . Smokeless tobacco: Never Used  . Alcohol use No     Allergies   Patient has no known allergies.   Review of Systems Review of Systems  Gastrointestinal: Positive for abdominal pain.  Musculoskeletal: Positive for back pain ( upper).  All other systems reviewed and are negative.    Physical Exam Updated Vital Signs BP 147/90   Pulse (!) 54   Temp 98.4 F (36.9 C) (Oral)   Resp 22   SpO2 100%   Physical Exam  Constitutional: She appears well-developed and well-nourished.  HENT:  Head: Normocephalic and atraumatic.  Mouth/Throat: Oropharynx is clear and moist.  Eyes: Conjunctivae are normal. No scleral icterus.  Cardiovascular: Normal rate, regular rhythm and intact distal pulses.   Pulmonary/Chest: Effort normal and breath sounds normal.  Abdominal: Soft. Bowel sounds are normal. She exhibits no distension and no mass. There is tenderness in the right upper quadrant, epigastric area and left upper quadrant. There is guarding. There is no rigidity, no rebound and no CVA tenderness. No hernia.  Neurological: She is alert.  Skin: Skin is warm and dry.  Psychiatric: She has a normal mood and affect.  Nursing note and vitals reviewed.    ED Treatments / Results  Labs (all labs ordered are listed, but only abnormal results are displayed) Labs Reviewed  LIPASE, BLOOD - Abnormal; Notable for the following:       Result Value   Lipase >3,000 (*)    All other components within normal limits  COMPREHENSIVE METABOLIC PANEL - Abnormal; Notable for the following:    CO2 21 (*)    Glucose, Bld 121 (*)    All other components within normal limits  CBC  URINALYSIS, ROUTINE W REFLEX MICROSCOPIC (NOT AT [facility])  LIPID PANEL  I-STAT CG4 LACTIC ACID, ED     Radiology Ct Abdomen Pelvis W Contrast  Result Date: 02/11/2016 CLINICAL DATA:  Abdominal pain, history of pancreatitis EXAM: CT ABDOMEN AND PELVIS WITH CONTRAST TECHNIQUE: Multidetector CT imaging of the abdomen and pelvis was performed using the standard protocol following bolus administration of intravenous contrast. CONTRAST:  100 mL of Isovue-300 IV COMPARISON:  01/04/2015 FINDINGS: Lower chest: There is borderline cardiomegaly without pericardial effusion. There is atelectasis at each lung base. Hepatobiliary: Fatty infiltration of the liver.  No gallstones. Pancreas: Peripancreatic edema without pancreatic necrosis. No ductal dilatation. No pancreatic mass. Spleen: No normal Adrenals/Urinary Tract: Normal bilateral adrenal glands. 1 mm calculus in the left upper pole of the kidney. Normal-appearing bladder. Stomach/Bowel: Peripancreatic edema outlines the second and third portion of the duodenum. There is sigmoid diverticulosis. Vascular/Lymphatic: No significant vascular findings are present. No enlarged abdominal or pelvic lymph nodes. No aortic aneurysm. Reproductive: Uterus and bilateral adnexa are unremarkable. Other: No abdominal wall hernia or abnormality. Musculoskeletal: No acute or significant osseous findings. IMPRESSION: Findings in keeping with acute pancreatitis. Fatty appearing liver. Electronically Signed   By: Tollie Eth M.D.   On: 02/11/2016 01:11    Procedures Procedures (including critical care time)  Medications Ordered in ED Medications  ondansetron (ZOFRAN-ODT) disintegrating tablet 4 mg (4 mg Oral Given 02/10/16 1734)  morphine 4 MG/ML injection 4 mg (4 mg Intravenous Given 02/11/16 0021)  ondansetron (ZOFRAN) injection 4 mg (4 mg Intravenous Given 02/11/16 0020)     Initial Impression / Assessment and Plan / ED Course  I have reviewed the triage vital signs and the nursing notes.  Pertinent labs & imaging results that were available during my care of the  patient were reviewed by me and considered in my medical decision making (see chart for details).  Clinical Course as of Feb 11 216  Thu Feb 11, 2016  0216 Acute pancreatitis CT ABDOMEN PELVIS W CONTRAST [HM]  0216 Significantly elevated Lipase: (!) >3,000 [HM]  0216 No leukocytosis WBC: 6.9 [HM]  0216 AST/ALT within normal limits AST: 29 [HM]  0216 Normal Lactic Acid, Venous: 0.92 [HM]  0216 Discussed with Dr. Clyde Lundborg who will admit to MedSurg, observation.  [HM]    Clinical Course User Index [HM] Dierdre Forth, PA-C    Patient presents with epigastric pain. Evidence of pancreatitis with severe abdominal pain, persistent vomiting, significantly elevated lipase. CT scan shows uncomplicated pancreatitis. Patient will be admitted for observation. She will be kept nothing by mouth.  Final Clinical Impressions(s) / ED Diagnoses   Final diagnoses:  Acute pancreatitis, unspecified complication status, unspecified pancreatitis type    New Prescriptions New Prescriptions   No medications on file     Dierdre Forth, PA-C 02/11/16 0217  Tomasita CrumbleAdeleke Oni, MD 02/11/16 [phone]

## 2016-02-11 ENCOUNTER — Observation Stay (HOSPITAL_COMMUNITY): Payer: Self-pay

## 2016-02-11 ENCOUNTER — Emergency Department (HOSPITAL_COMMUNITY): Payer: Self-pay

## 2016-02-11 ENCOUNTER — Encounter (HOSPITAL_COMMUNITY): Payer: Self-pay | Admitting: Internal Medicine

## 2016-02-11 DIAGNOSIS — K8500 Idiopathic acute pancreatitis without necrosis or infection: Secondary | ICD-10-CM

## 2016-02-11 DIAGNOSIS — R1010 Upper abdominal pain, unspecified: Secondary | ICD-10-CM

## 2016-02-11 DIAGNOSIS — K859 Acute pancreatitis without necrosis or infection, unspecified: Secondary | ICD-10-CM

## 2016-02-11 LAB — URINALYSIS, ROUTINE W REFLEX MICROSCOPIC
Bilirubin Urine: NEGATIVE
Glucose, UA: NEGATIVE mg/dL
Hgb urine dipstick: NEGATIVE
Ketones, ur: NEGATIVE mg/dL
LEUKOCYTES UA: NEGATIVE
NITRITE: POSITIVE — AB
PH: 5 (ref 5.0–8.0)
Protein, ur: NEGATIVE mg/dL
SPECIFIC GRAVITY, URINE: 1.01 (ref 1.005–1.030)

## 2016-02-11 LAB — BASIC METABOLIC PANEL
ANION GAP: 6 (ref 5–15)
BUN: 12 mg/dL (ref 6–20)
CHLORIDE: 109 mmol/L (ref 101–111)
CO2: 25 mmol/L (ref 22–32)
Calcium: 8.5 mg/dL — ABNORMAL LOW (ref 8.9–10.3)
Creatinine, Ser: 0.69 mg/dL (ref 0.44–1.00)
GFR calc non Af Amer: >60 mL/min (ref >60)
Glucose, Bld: 104 mg/dL — ABNORMAL HIGH (ref 65–99)
Potassium: 4 mmol/L (ref 3.5–5.1)
Sodium: 140 mmol/L (ref 135–145)

## 2016-02-11 LAB — URINE MICROSCOPIC-ADD ON

## 2016-02-11 LAB — LIPID PANEL
CHOL/HDL RATIO: 4.7 ratio
CHOLESTEROL: 230 mg/dL — AB (ref 0–200)
HDL: 49 mg/dL (ref >40)
LDL CALC: 155 mg/dL — AB (ref 0–99)
TRIGLYCERIDES: 131 mg/dL (ref <150)
VLDL: 26 mg/dL (ref 0–40)

## 2016-02-11 LAB — LIPASE, BLOOD: LIPASE: 768 U/L — AB (ref 11–51)

## 2016-02-11 LAB — CBC
HEMATOCRIT: 35.7 % — AB (ref 36.0–46.0)
HEMOGLOBIN: 11.6 g/dL — AB (ref 12.0–15.0)
MCH: 27.6 pg (ref 26.0–34.0)
MCHC: 32.5 g/dL (ref 30.0–36.0)
MCV: 85 fL (ref 78.0–100.0)
Platelets: 218 10*3/uL (ref 150–400)
RBC: 4.2 MIL/uL (ref 3.87–5.11)
RDW: 13.9 % (ref 11.5–15.5)
WBC: 5.2 10*3/uL (ref 4.0–10.5)

## 2016-02-11 LAB — I-STAT CG4 LACTIC ACID, ED: Lactic Acid, Venous: 0.92 mmol/L (ref 0.5–1.9)

## 2016-02-11 MED ORDER — BISACODYL 5 MG PO TBEC
5.0000 mg | DELAYED_RELEASE_TABLET | Freq: Every day | ORAL | Status: DC | PRN
  Filled 2016-02-11: qty 1

## 2016-02-11 MED ORDER — SODIUM CHLORIDE 0.9 % IV SOLN
INTRAVENOUS | Status: DC
  Administered 2016-02-11: 20:00:00 via INTRAVENOUS
  Administered 2016-02-11: 1000 mL via INTRAVENOUS
  Administered 2016-02-12 – 2016-02-13 (×4): via INTRAVENOUS

## 2016-02-11 MED ORDER — GADOBENATE DIMEGLUMINE 529 MG/ML IV SOLN
16.0000 mL | Freq: Once | INTRAVENOUS | Status: AC | PRN
  Administered 2016-02-11: 16 mL via INTRAVENOUS

## 2016-02-11 MED ORDER — INFLUENZA VAC SPLIT QUAD 0.5 ML IM SUSY
0.5000 mL | PREFILLED_SYRINGE | INTRAMUSCULAR | Status: AC
  Administered 2016-02-12: 0.5 mL via INTRAMUSCULAR
  Filled 2016-02-11: qty 0.5

## 2016-02-11 MED ORDER — LORAZEPAM 2 MG/ML IJ SOLN
1.0000 mg | Freq: Once | INTRAMUSCULAR | Status: AC
  Administered 2016-02-11: 1 mg via INTRAVENOUS
  Filled 2016-02-11: qty 1

## 2016-02-11 MED ORDER — ZOLPIDEM TARTRATE 5 MG PO TABS
5.0000 mg | ORAL_TABLET | Freq: Every evening | ORAL | Status: DC | PRN
Start: 2016-02-11 — End: 2016-02-13

## 2016-02-11 MED ORDER — ENOXAPARIN SODIUM 40 MG/0.4ML ~~LOC~~ SOLN
40.0000 mg | Freq: Every day | SUBCUTANEOUS | Status: DC
  Administered 2016-02-11 – 2016-02-13 (×3): 40 mg via SUBCUTANEOUS
  Filled 2016-02-11 (×3): qty 0.4

## 2016-02-11 MED ORDER — MORPHINE SULFATE (PF) 4 MG/ML IV SOLN
2.0000 mg | INTRAVENOUS | Status: DC | PRN
Start: 2016-02-11 — End: 2016-02-13

## 2016-02-11 MED ORDER — ONDANSETRON HCL 4 MG/2ML IJ SOLN: 4.0000 mg | Freq: Three times a day (TID) | INTRAMUSCULAR | Status: AC | PRN

## 2016-02-11 NOTE — ED Notes (Signed)
RN unable to take report at this time, will return call for report

## 2016-02-11 NOTE — Progress Notes (Signed)
Patient seen and examined  [age] y.o. female with medical history significant of pancreatitis, syncope, who presents with abdominal pain, nausea and vomiting.  Pt state that her abdominal pain started about 2 PM. It is located in the upper abdomen, constant, sharp, 10 out of 10 in severity, radiating to both side of abdomen and back. It is associated nausea and 2 episodes of NBNB. Denies EtOH, tobacco and drug usage. Pt had hx of pancreatitis last year with unclear etiology. pt was found to have elevated lipase>3000, triglyceride 131,CT abdomen/pelvis showed peripancreatic edema without pancreatic necrosis without ductal dilatation. Pt is released on MedSurg bed for observation.  Assessment and plan  Recurrent pancreatitis: Etiology is not clear. No offensive medications. Triglyceride 131. No ductal dilation by CT scan. Calcium 9.0. Currently hemodynamically stable.  -will place on med-surg bed for observation -NPO for pancreatitis, may need MRCP for further evaluation of pancreatic duct anatomy, rule out stricture -IVF: 150 cc/hr -IV morphine for pain control, IV zofran for nausea -abdominal ultrasound to re-evaluate pancreas, gallbladder, and bile ducts

## 2016-02-11 NOTE — H&P (Addendum)
History and Physical    Evelyn Dillon YQM:[id number] DOB: 03-04-1961 DOA: 02/10/2016  Referring MD/NP/PA:   PCP: No PCP Per Patient   Patient coming from:  The patient is coming from home.  At baseline, pt is independent for most of ADL.  Chief Complaint: Abdominal pain, nausea and vomiting  HPI: Evelyn Dillon is a 55 y.o. female with medical history significant of pancreatitis, syncope, who presents with abdominal pain, nausea and vomiting.  Pt state that her abdominal pain started about 2 PM. It is located in the upper abdomen, constant, sharp, 10 out of 10 in severity, radiating to both side of abdomen and back. It is associated nausea and 2 episodes of NBNB. Denies EtOH, tobacco and drug usage. Pt had hx of pancreatitis last year with unclear etiology. Patient denies chest pain, shortness breath, fever, chills, diarrhea, symptoms of UTI or unilateral weakness.  ED Course: pt was found to have elevated lipase>3000, triglyceride 131, lactic acid 0.92, calcium 9.0, electrolytes renal function okay, pending UA, temperature normal, bradycardia. CT abdomen/pelvis showed peripancreatic edema without pancreatic necrosis without ductal dilatation. Pt is released on MedSurg bed for observation.  Review of Systems:   General: no fevers, chills, no changes in body weight, has poor appetite, has fatigue HEENT: no blurry vision, hearing changes or sore throat Respiratory: no dyspnea, coughing, wheezing CV: no chest pain, no palpitations GI: has nausea, vomiting, abdominal pain, no diarrhea, constipation GU: no dysuria, burning on urination, increased urinary frequency, hematuria  Ext: no leg edema Neuro: no unilateral weakness, numbness, or tingling, no vision change or hearing loss Skin: no rash, no skin tear. MSK: No muscle spasm, no deformity, no limitation of range of movement in spin Heme: No easy bruising.  Travel history: No recent long distant travel.  Allergy: No Known  Allergies  Past Medical History:  Diagnosis Date  . Kidney infection   . Pancreatitis   . Syncope    a. 04/2014: tele unrevealing, echo with normal EF & grade 2 DD, normal nuc. Etiology not clear.    Past Surgical History:  Procedure Laterality Date  . No prior surgery      Social History:  reports that she has never smoked. She has never used smokeless tobacco. She reports that she does not drink alcohol or use drugs.  Family History:  Family History  Problem Relation Age of Onset  . Diabetes Mellitus I Sister   . Heart disease Sister     Died at 86     Prior to Admission medications   Medication Sig Start Date End Date Taking? Authorizing Provider  meloxicam (MOBIC) 7.5 MG tablet Take 1 tablet (7.5 mg total) by mouth daily. 05/28/15   Shade Flood, MD    Physical Exam: Vitals:   02/11/16 0015 02/11/16 0145 02/11/16 0215 02/11/16 0300  BP: 147/90 108/60 95/61 99/68   Pulse: (!) 54 (!) 58 (!) 56 (!) 59  Resp:      Temp:      TempSrc:      SpO2: 100% 99% 96% 98%   General: Not in acute distress HEENT:       Eyes: PERRL, EOMI, no scleral icterus.       ENT: No discharge from the ears and nose, no pharynx injection, no tonsillar enlargement.        Neck: No JVD, no bruit, no mass felt. Heme: No neck lymph node enlargement. Cardiac: S1/S2, RRR, No murmurs, No gallops or rubs. Respiratory: No rales, wheezing,  rhonchi or rubs. GI: Soft, nondistended, has tenderness in upper abdomen, no rebound pain, no organomegaly, BS present. GU: No hematuria Ext: No pitting leg edema bilaterally. 2+DP/PT pulse bilaterally. Musculoskeletal: No joint deformities, No joint redness or warmth, no limitation of ROM in spin. Skin: No rashes.  Neuro: Alert, oriented X3, cranial nerves II-XII grossly intact, moves all extremities normally.  Psych: Patient is not psychotic, no suicidal or hemocidal ideation.  Labs on Admission: I have personally reviewed following labs and imaging  studies  CBC:  Recent Labs Lab 02/10/16 1718  WBC 6.9  HGB 12.6  HCT 37.9  MCV 84.0  PLT 255   Basic Metabolic Panel:  Recent Labs Lab 02/10/16 1718  NA 139  K 3.5  CL 109  CO2 21*  GLUCOSE 121*  BUN 14  CREATININE 0.76  CALCIUM 9.0   GFR: CrCl cannot be calculated (Unknown ideal weight.). Liver Function Tests:  Recent Labs Lab 02/10/16 1718  AST 29  ALT 27  ALKPHOS 83  BILITOT 0.4  PROT 7.0  ALBUMIN 4.1    Recent Labs Lab 02/10/16 1718  LIPASE >3,000*   No results for input(s): AMMONIA in the last 168 hours. Coagulation Profile: No results for input(s): INR, PROTIME in the last 168 hours. Cardiac Enzymes: No results for input(s): CKTOTAL, CKMB, CKMBINDEX, TROPONINI in the last 168 hours. BNP (last 3 results) No results for input(s): PROBNP in the last 8760 hours. HbA1C: No results for input(s): HGBA1C in the last 72 hours. CBG: No results for input(s): GLUCAP in the last 168 hours. Lipid Profile:  Recent Labs  02/10/16 1718  CHOL 230*  HDL 49  LDLCALC 155*  TRIG 131  CHOLHDL 4.7   Thyroid Function Tests: No results for input(s): TSH, T4TOTAL, FREET4, T3FREE, THYROIDAB in the last 72 hours. Anemia Panel: No results for input(s): VITAMINB12, FOLATE, FERRITIN, TIBC, IRON, RETICCTPCT in the last 72 hours. Urine analysis:    Component Value Date/Time   COLORURINE YELLOW 01/03/2015 2238   APPEARANCEUR CLOUDY (A) 01/03/2015 2238   LABSPEC 1.018 01/03/2015 2238   PHURINE 5.0 01/03/2015 2238   GLUCOSEU NEGATIVE 01/03/2015 2238   HGBUR TRACE (A) 01/03/2015 2238   BILIRUBINUR NEGATIVE 01/03/2015 2238   KETONESUR NEGATIVE 01/03/2015 2238   PROTEINUR NEGATIVE 01/03/2015 2238   UROBILINOGEN 0.2 01/03/2015 2238   NITRITE NEGATIVE 01/03/2015 2238   LEUKOCYTESUR NEGATIVE 01/03/2015 2238   Sepsis Labs: @LABRCNTIP (procalcitonin:4,lacticidven:4) )No results found for this or any previous visit (from the past 240 hour(s)).   Radiological Exams  on Admission: Ct Abdomen Pelvis W Contrast  Result Date: 02/11/2016 CLINICAL DATA:  Abdominal pain, history of pancreatitis EXAM: CT ABDOMEN AND PELVIS WITH CONTRAST TECHNIQUE: Multidetector CT imaging of the abdomen and pelvis was performed using the standard protocol following bolus administration of intravenous contrast. CONTRAST:  100 mL of Isovue-300 IV COMPARISON:  01/04/2015 FINDINGS: Lower chest: There is borderline cardiomegaly without pericardial effusion. There is atelectasis at each lung base. Hepatobiliary: Fatty infiltration of the liver.  No gallstones. Pancreas: Peripancreatic edema without pancreatic necrosis. No ductal dilatation. No pancreatic mass. Spleen: No normal Adrenals/Urinary Tract: Normal bilateral adrenal glands. 1 mm calculus in the left upper pole of the kidney. Normal-appearing bladder. Stomach/Bowel: Peripancreatic edema outlines the second and third portion of the duodenum. There is sigmoid diverticulosis. Vascular/Lymphatic: No significant vascular findings are present. No enlarged abdominal or pelvic lymph nodes. No aortic aneurysm. Reproductive: Uterus and bilateral adnexa are unremarkable. Other: No abdominal wall hernia or abnormality. Musculoskeletal: No  acute or significant osseous findings. IMPRESSION: Findings in keeping with acute pancreatitis. Fatty appearing liver. Electronically Signed   By: Tollie Ethavid  Kwon M.D.   On: 02/11/2016 01:11     EKG:  Not done in ED, will get one.   Assessment/Plan Principal Problem:   Pancreatitis Active Problems:   Upper abdominal pain   Recurrent pancreatitis: Etiology is not clear. No offensive medications. Triglyceride 131. No ductal dilation by CT scan. Calcium 9.0. Currently hemodynamically stable.  -will place on med-surg bed for observation -NPO for pancreatitis -IVF: 150 cc/hr -IV morphine for pain control, IV zofran for nausea -abdominal ultrasound to re-evaluate pancreas, gallbladder, and bile ducts  -f/u  UA  DVT ppx: SQ Lovenox Code Status: Full code Family Communication: None at bed side.  Disposition Plan:  Anticipate discharge back to previous home environment Consults called:  none Admission status: medical floor/obs   Date of Service 02/11/2016    Lorretta HarpIU, Shadell Brenn [facility] Pager [phone]  If 7PM-7AM, please contact night-coverage [email or web address] Password [email or web address] 02/11/2016, 3:24 AM

## 2016-02-11 NOTE — Progress Notes (Signed)
Interpreter Wyvonnia DuskyGraciela Namihira for financials assistance

## 2016-02-11 NOTE — Progress Notes (Signed)
Interpreter Wyvonnia DuskyGraciela Namihira for [facility] and Dr Susie CassetteAbrol MD

## 2016-02-11 NOTE — ED Notes (Signed)
Pt to CT via stretcher

## 2016-02-12 DIAGNOSIS — K861 Other chronic pancreatitis: Principal | ICD-10-CM

## 2016-02-12 LAB — COMPREHENSIVE METABOLIC PANEL
ALBUMIN: 3.2 g/dL — AB (ref 3.5–5.0)
ALK PHOS: 76 U/L (ref 38–126)
ALT: 20 U/L (ref 14–54)
AST: 22 U/L (ref 15–41)
Anion gap: 10 (ref 5–15)
BILIRUBIN TOTAL: 0.5 mg/dL (ref 0.3–1.2)
BUN: 8 mg/dL (ref 6–20)
CO2: 23 mmol/L (ref 22–32)
CREATININE: 0.66 mg/dL (ref 0.44–1.00)
Calcium: 8.5 mg/dL — ABNORMAL LOW (ref 8.9–10.3)
Chloride: 107 mmol/L (ref 101–111)
GFR calc Af Amer: >60 mL/min (ref >60)
GLUCOSE: 84 mg/dL (ref 65–99)
POTASSIUM: 3.5 mmol/L (ref 3.5–5.1)
Sodium: 140 mmol/L (ref 135–145)
TOTAL PROTEIN: 5.9 g/dL — AB (ref 6.5–8.1)

## 2016-02-12 LAB — CBC
HEMATOCRIT: 36.2 % (ref 36.0–46.0)
HEMOGLOBIN: 11.6 g/dL — AB (ref 12.0–15.0)
MCH: 27.5 pg (ref 26.0–34.0)
MCHC: 32 g/dL (ref 30.0–36.0)
MCV: 85.8 fL (ref 78.0–100.0)
Platelets: 206 10*3/uL (ref 150–400)
RBC: 4.22 MIL/uL (ref 3.87–5.11)
RDW: 13.8 % (ref 11.5–15.5)
WBC: 4.9 10*3/uL (ref 4.0–10.5)

## 2016-02-12 LAB — GLUCOSE, CAPILLARY: Glucose-Capillary: 85 mg/dL (ref 65–99)

## 2016-02-12 LAB — LIPASE, BLOOD: Lipase: 49 U/L (ref 11–51)

## 2016-02-12 MED ORDER — ACETAMINOPHEN 325 MG PO TABS
650.0000 mg | ORAL_TABLET | Freq: Four times a day (QID) | ORAL | Status: DC | PRN
  Administered 2016-02-12: 650 mg via ORAL
  Filled 2016-02-12: qty 2

## 2016-02-12 NOTE — Progress Notes (Signed)
[facility] PROGRESS NOTE  Evelyn Dillon ZOX:[id number] DOB: Oct 06, 1960 DOA: 02/10/2016   PCP: No PCP Per Patient     Assessment/Plan: Principal Problem:   Pancreatitis Active Problems:   Upper abdominal pain    55 y.o.femalewith medical history significant of pancreatitis, syncope, who presents with abdominal pain, nausea and vomiting.  Pt state that her abdominal pain started about 2 PM. It is located in the upper abdomen, constant, sharp, 10 out of 10 in severity, radiating to both side of abdomen and back. It is associated nausea and 2 episodes of NBNB. Denies EtOH, tobacco and drug usage. Pt had hx of pancreatitis last year with unclear etiology. pt was found to have elevated lipase>3000, triglyceride 131,CT abdomen/pelvis showed peripancreatic edema without pancreatic necrosis without ductal dilatation.    Assessment and plan Recurrent pancreatitis: Etiology is not clear. No offensive medications. Triglyceride 131. No ductal dilation by CT scan. Calcium 9.0. Currently hemodynamically stable. -will place on med-surg bed for observation -NPO for pancreatitis,   MRCP  Edema within the pancreatic head and fluid extending along the second portion duodenum and into Morrison's pouch is most consistent acute pancreatitis. Duodenitis felt less likely.No pancreatic duct dilatation or obstruction identified. No pancreatic necrosis. No organized fluid collections. 3. No choledocholithiasis.  No gallstones. -IVF: 150cc/hr -IV morphine for pain control, IV zofran for nausea -advance to clear liquids  DVT prophylaxsis lovenox  Code Status:  Full code   Family Communication: Discussed in detail with the patient, all imaging results, lab results explained to the patient   Disposition Plan:1-2 days    Consultants:  none  Procedures:  none  Antibiotics: Anti-infectives    None         HPI/Subjective: No significant pain today  Objective: Vitals:    02/11/16 1730 02/11/16 2149 02/12/16 0518 02/12/16 1000  BP: 118/63 124/63 127/68 (!) 115/51  Pulse: 63 68 71 97  Resp: 20 18 18 18   Temp: 98.3 F (36.8 C) 98.3 F (36.8 C) 98.3 F (36.8 C) 98.6 F (37 C)  TempSrc: Oral Oral Oral Oral  SpO2: 96% 96% 95% 98%  Weight:  73 kg (160 lb 15 oz)      Intake/Output Summary (Last 24 hours) at 02/12/16 1323 Last data filed at 02/12/16 0900  Gross per 24 hour  Intake             2975 ml  Output              600 ml  Net             2375 ml    Exam:  Examination:  General exam: Appears calm and comfortable  Respiratory system: Clear to auscultation. Respiratory effort normal. Cardiovascular system: S1 & S2 heard, RRR. No JVD, murmurs, rubs, gallops or clicks. No pedal edema. Gastrointestinal system: Abdomen is nondistended, soft and nontender. No organomegaly or masses felt. Normal bowel sounds heard. Central nervous system: Alert and oriented. No focal neurological deficits. Extremities: Symmetric 5 x 5 power. Skin: No rashes, lesions or ulcers Psychiatry: Judgement and insight appear normal. Mood & affect appropriate.     Data Reviewed: I have personally reviewed following labs and imaging studies  Micro Results No results found for this or any previous visit (from the past 240 hour(s)).  Radiology Reports Ct Abdomen Pelvis W Contrast  Result Date: 02/11/2016 CLINICAL DATA:  Abdominal pain, history of pancreatitis EXAM: CT ABDOMEN AND PELVIS WITH CONTRAST TECHNIQUE: Multidetector CT  imaging of the abdomen and pelvis was performed using the standard protocol following bolus administration of intravenous contrast. CONTRAST:  100 mL of Isovue-300 IV COMPARISON:  01/04/2015 FINDINGS: Lower chest: There is borderline cardiomegaly without pericardial effusion. There is atelectasis at each lung base. Hepatobiliary: Fatty infiltration of the liver.  No gallstones. Pancreas: Peripancreatic edema without pancreatic necrosis. No ductal  dilatation. No pancreatic mass. Spleen: No normal Adrenals/Urinary Tract: Normal bilateral adrenal glands. 1 mm calculus in the left upper pole of the kidney. Normal-appearing bladder. Stomach/Bowel: Peripancreatic edema outlines the second and third portion of the duodenum. There is sigmoid diverticulosis. Vascular/Lymphatic: No significant vascular findings are present. No enlarged abdominal or pelvic lymph nodes. No aortic aneurysm. Reproductive: Uterus and bilateral adnexa are unremarkable. Other: No abdominal wall hernia or abnormality. Musculoskeletal: No acute or significant osseous findings. IMPRESSION: Findings in keeping with acute pancreatitis. Fatty appearing liver. Electronically Signed   By: Tollie Eth M.D.   On: 02/11/2016 01:11   Mr 3d Recon At Scanner  Result Date: 02/12/2016 CLINICAL DATA:  Pt unable to participate in exam due to sedation meds. Unable to hold breath. Best images sent 55 y.o. female with medical history significant of pancreatitis, syncope, who presents with abdominal pain, nausea and vomiting. Pt state that her abdominal pain started about 2 PM. It is located in the upper abdomen, constant, sharp, 10 out of 10 in severity, radiating to both side of abdomen and back. It is associated nausea and 2 episodes of NBNB. Denies EtOH, tobacco and drug usage. Pt had hx of pancreatitis last year with unclear etiology. EXAM: MRI ABDOMEN WITHOUT AND WITH CONTRAST (INCLUDING MRCP) TECHNIQUE: Multiplanar multisequence MR imaging of the abdomen was performed both before and after the administration of intravenous contrast. Heavily T2-weighted images of the biliary and pancreatic ducts were obtained, and three-dimensional MRCP images were rendered by post processing. CONTRAST:  16mL MULTIHANCE GADOBENATE DIMEGLUMINE 529 MG/ML IV SOLN COMPARISON:  CT 02/11/2016 FINDINGS: Pt unable to participate in exam due to sedation meds. Unable to hold breath. Best images sent Lower chest:  Lung bases are  clear. Hepatobiliary: Loss of signal intensity on the opposed phase imaging (series 1401) consistent hepatic steatosis. Motion degradation noted. There is no intrahepatic duct dilatation. Common bile duct normal caliber of through the pancreatic head. No gallbladder distension. No gallstones present. Postcontrast imaging demonstrates no enhancing hepatic lesion. Pancreas: There is fluid along the pancreatic head and uncinate as well as the second portion duodenum. There is edema within the uncinate of the pancreas. There is no pancreatic duct dilatation. The pancreas enhances uniformly. Again motion degradation does limit the exam. No fluid collections. Fluid also extends along the Morrison's pouch in the RIGHT pericolic gutter. Noted vascular complication evident. Spleen: Normal spleen. Adrenals/urinary tract: Adrenal glands and kidneys are normal. Stomach/Bowel: Normal stomach. Small of fluid along the second portion duodenum. Limited view of the colon unremarkable. Vascular/Lymphatic: No lymphadenopathy. Aorta normal. No vascular complication Musculoskeletal: No aggressive osseous lesion IMPRESSION: 1. Edema within the pancreatic head and fluid extending along the second portion duodenum and into Morrison's pouch is most consistent acute pancreatitis. Duodenitis felt less likely. 2. No pancreatic duct dilatation or obstruction identified. No pancreatic necrosis. No organized fluid collections. 3. No choledocholithiasis.  No gallstones. 4. Hepatic steatosis. Electronically Signed   By: Genevive Bi M.D.   On: 02/12/2016 10:01   Mr Abdomen Mrcp Vivien Rossetti Contast  Result Date: 02/12/2016 CLINICAL DATA:  Pt unable to participate in exam due  to sedation meds. Unable to hold breath. Best images sent 55 y.o. female with medical history significant of pancreatitis, syncope, who presents with abdominal pain, nausea and vomiting. Pt state that her abdominal pain started about 2 PM. It is located in the upper abdomen,  constant, sharp, 10 out of 10 in severity, radiating to both side of abdomen and back. It is associated nausea and 2 episodes of NBNB. Denies EtOH, tobacco and drug usage. Pt had hx of pancreatitis last year with unclear etiology. EXAM: MRI ABDOMEN WITHOUT AND WITH CONTRAST (INCLUDING MRCP) TECHNIQUE: Multiplanar multisequence MR imaging of the abdomen was performed both before and after the administration of intravenous contrast. Heavily T2-weighted images of the biliary and pancreatic ducts were obtained, and three-dimensional MRCP images were rendered by post processing. CONTRAST:  16mL MULTIHANCE GADOBENATE DIMEGLUMINE 529 MG/ML IV SOLN COMPARISON:  CT 02/11/2016 FINDINGS: Pt unable to participate in exam due to sedation meds. Unable to hold breath. Best images sent Lower chest:  Lung bases are clear. Hepatobiliary: Loss of signal intensity on the opposed phase imaging (series 1401) consistent hepatic steatosis. Motion degradation noted. There is no intrahepatic duct dilatation. Common bile duct normal caliber of through the pancreatic head. No gallbladder distension. No gallstones present. Postcontrast imaging demonstrates no enhancing hepatic lesion. Pancreas: There is fluid along the pancreatic head and uncinate as well as the second portion duodenum. There is edema within the uncinate of the pancreas. There is no pancreatic duct dilatation. The pancreas enhances uniformly. Again motion degradation does limit the exam. No fluid collections. Fluid also extends along the Morrison's pouch in the RIGHT pericolic gutter. Noted vascular complication evident. Spleen: Normal spleen. Adrenals/urinary tract: Adrenal glands and kidneys are normal. Stomach/Bowel: Normal stomach. Small of fluid along the second portion duodenum. Limited view of the colon unremarkable. Vascular/Lymphatic: No lymphadenopathy. Aorta normal. No vascular complication Musculoskeletal: No aggressive osseous lesion IMPRESSION: 1. Edema within the  pancreatic head and fluid extending along the second portion duodenum and into Morrison's pouch is most consistent acute pancreatitis. Duodenitis felt less likely. 2. No pancreatic duct dilatation or obstruction identified. No pancreatic necrosis. No organized fluid collections. 3. No choledocholithiasis.  No gallstones. 4. Hepatic steatosis. Electronically Signed   By: Genevive Bi M.D.   On: 02/12/2016 10:01   US Abdomen Limited Ruq  Result Date: 02/11/2016 CLINICAL DATA:  Acute onset of generalized abdominal pain. Initial encounter. EXAM: US ABDOMEN LIMITED - RIGHT UPPER QUADRANT COMPARISON:  CT of the abdomen and pelvis performed earlier today at 12:46 a.m. FINDINGS: Gallbladder: No gallstones or wall thickening visualized. No sonographic Murphy sign noted by sonographer. Common bile duct: Diameter: 0.5 cm, within normal limits in caliber. Liver: No focal lesion identified. Diffusely increased parenchymal echogenicity, compatible with fatty infiltration. Trace fluid is incidentally noted adjacent to the liver. IMPRESSION: 1. No acute abnormality seen at the right upper quadrant. 2. Diffuse fatty infiltration within the liver. 3. Trace ascites adjacent to the liver. Electronically Signed   By: Roanna Raider M.D.   On: 02/11/2016 05:07     CBC  Recent Labs Lab 02/10/16 1718 02/11/16 0347 02/12/16 0439  WBC 6.9 5.2 4.9  HGB 12.6 11.6* 11.6*  HCT 37.9 35.7* 36.2  PLT 255 218 206  MCV 84.0 85.0 85.8  MCH 27.9 27.6 27.5  MCHC 33.2 32.5 32.0  RDW 13.6 13.9 13.8    Chemistries   Recent Labs Lab 02/10/16 1718 02/11/16 0347 02/12/16 0439  NA 139 140 140  K 3.5 4.0  3.5  CL 109 109 107  CO2 21* 25 23  GLUCOSE 121* 104* 84  BUN 14 12 8   CREATININE 0.76 0.69 0.66  CALCIUM 9.0 8.5* 8.5*  AST 29  --  22  ALT 27  --  20  ALKPHOS 83  --  76  BILITOT 0.4  --  0.5    ------------------------------------------------------------------------------------------------------------------ CrCl cannot be calculated (Unknown ideal weight.). ------------------------------------------------------------------------------------------------------------------ No results for input(s): HGBA1C in the last 72 hours. ------------------------------------------------------------------------------------------------------------------  Recent Labs  02/10/16 1718  CHOL 230*  HDL 49  LDLCALC 155*  TRIG 131  CHOLHDL 4.7   ------------------------------------------------------------------------------------------------------------------ No results for input(s): TSH, T4TOTAL, T3FREE, THYROIDAB in the last 72 hours.  Invalid input(s): FREET3 ------------------------------------------------------------------------------------------------------------------ No results for input(s): VITAMINB12, FOLATE, FERRITIN, TIBC, IRON, RETICCTPCT in the last 72 hours.  Coagulation profile No results for input(s): INR, PROTIME in the last 168 hours.  No results for input(s): DDIMER in the last 72 hours.  Cardiac Enzymes No results for input(s): CKMB, TROPONINI, MYOGLOBIN in the last 168 hours.  Invalid input(s): CK ------------------------------------------------------------------------------------------------------------------ Invalid input(s): POCBNP   CBG:  Recent Labs Lab 02/12/16 0719  GLUCAP 85       Studies: Ct Abdomen Pelvis W Contrast  Result Date: 02/11/2016 CLINICAL DATA:  Abdominal pain, history of pancreatitis EXAM: CT ABDOMEN AND PELVIS WITH CONTRAST TECHNIQUE: Multidetector CT imaging of the abdomen and pelvis was performed using the standard protocol following bolus administration of intravenous contrast. CONTRAST:  100 mL of Isovue-300 IV COMPARISON:  01/04/2015 FINDINGS: Lower chest: There is borderline cardiomegaly without pericardial effusion. There is  atelectasis at each lung base. Hepatobiliary: Fatty infiltration of the liver.  No gallstones. Pancreas: Peripancreatic edema without pancreatic necrosis. No ductal dilatation. No pancreatic mass. Spleen: No normal Adrenals/Urinary Tract: Normal bilateral adrenal glands. 1 mm calculus in the left upper pole of the kidney. Normal-appearing bladder. Stomach/Bowel: Peripancreatic edema outlines the second and third portion of the duodenum. There is sigmoid diverticulosis. Vascular/Lymphatic: No significant vascular findings are present. No enlarged abdominal or pelvic lymph nodes. No aortic aneurysm. Reproductive: Uterus and bilateral adnexa are unremarkable. Other: No abdominal wall hernia or abnormality. Musculoskeletal: No acute or significant osseous findings. IMPRESSION: Findings in keeping with acute pancreatitis. Fatty appearing liver. Electronically Signed   By: Tollie Ethavid  Kwon M.D.   On: 02/11/2016 01:11   Mr 3d Recon At Scanner  Result Date: 02/12/2016 CLINICAL DATA:  Pt unable to participate in exam due to sedation meds. Unable to hold breath. Best images sent 55 y.o. female with medical history significant of pancreatitis, syncope, who presents with abdominal pain, nausea and vomiting. Pt state that her abdominal pain started about 2 PM. It is located in the upper abdomen, constant, sharp, 10 out of 10 in severity, radiating to both side of abdomen and back. It is associated nausea and 2 episodes of NBNB. Denies EtOH, tobacco and drug usage. Pt had hx of pancreatitis last year with unclear etiology. EXAM: MRI ABDOMEN WITHOUT AND WITH CONTRAST (INCLUDING MRCP) TECHNIQUE: Multiplanar multisequence MR imaging of the abdomen was performed both before and after the administration of intravenous contrast. Heavily T2-weighted images of the biliary and pancreatic ducts were obtained, and three-dimensional MRCP images were rendered by post processing. CONTRAST:  16mL MULTIHANCE GADOBENATE DIMEGLUMINE 529 MG/ML IV  SOLN COMPARISON:  CT 02/11/2016 FINDINGS: Pt unable to participate in exam due to sedation meds. Unable to hold breath. Best images sent Lower chest:  Lung bases are clear. Hepatobiliary: Loss of signal intensity  on the opposed phase imaging (series 1401) consistent hepatic steatosis. Motion degradation noted. There is no intrahepatic duct dilatation. Common bile duct normal caliber of through the pancreatic head. No gallbladder distension. No gallstones present. Postcontrast imaging demonstrates no enhancing hepatic lesion. Pancreas: There is fluid along the pancreatic head and uncinate as well as the second portion duodenum. There is edema within the uncinate of the pancreas. There is no pancreatic duct dilatation. The pancreas enhances uniformly. Again motion degradation does limit the exam. No fluid collections. Fluid also extends along the Morrison's pouch in the RIGHT pericolic gutter. Noted vascular complication evident. Spleen: Normal spleen. Adrenals/urinary tract: Adrenal glands and kidneys are normal. Stomach/Bowel: Normal stomach. Small of fluid along the second portion duodenum. Limited view of the colon unremarkable. Vascular/Lymphatic: No lymphadenopathy. Aorta normal. No vascular complication Musculoskeletal: No aggressive osseous lesion IMPRESSION: 1. Edema within the pancreatic head and fluid extending along the second portion duodenum and into Morrison's pouch is most consistent acute pancreatitis. Duodenitis felt less likely. 2. No pancreatic duct dilatation or obstruction identified. No pancreatic necrosis. No organized fluid collections. 3. No choledocholithiasis.  No gallstones. 4. Hepatic steatosis. Electronically Signed   By: Genevive BiStewart  Edmunds M.D.   On: 02/12/2016 10:01   Mr Abdomen Mrcp Vivien RossettiW Wo Contast  Result Date: 02/12/2016 CLINICAL DATA:  Pt unable to participate in exam due to sedation meds. Unable to hold breath. Best images sent 55 y.o. female with medical history significant of  pancreatitis, syncope, who presents with abdominal pain, nausea and vomiting. Pt state that her abdominal pain started about 2 PM. It is located in the upper abdomen, constant, sharp, 10 out of 10 in severity, radiating to both side of abdomen and back. It is associated nausea and 2 episodes of NBNB. Denies EtOH, tobacco and drug usage. Pt had hx of pancreatitis last year with unclear etiology. EXAM: MRI ABDOMEN WITHOUT AND WITH CONTRAST (INCLUDING MRCP) TECHNIQUE: Multiplanar multisequence MR imaging of the abdomen was performed both before and after the administration of intravenous contrast. Heavily T2-weighted images of the biliary and pancreatic ducts were obtained, and three-dimensional MRCP images were rendered by post processing. CONTRAST:  16mL MULTIHANCE GADOBENATE DIMEGLUMINE 529 MG/ML IV SOLN COMPARISON:  CT 02/11/2016 FINDINGS: Pt unable to participate in exam due to sedation meds. Unable to hold breath. Best images sent Lower chest:  Lung bases are clear. Hepatobiliary: Loss of signal intensity on the opposed phase imaging (series 1401) consistent hepatic steatosis. Motion degradation noted. There is no intrahepatic duct dilatation. Common bile duct normal caliber of through the pancreatic head. No gallbladder distension. No gallstones present. Postcontrast imaging demonstrates no enhancing hepatic lesion. Pancreas: There is fluid along the pancreatic head and uncinate as well as the second portion duodenum. There is edema within the uncinate of the pancreas. There is no pancreatic duct dilatation. The pancreas enhances uniformly. Again motion degradation does limit the exam. No fluid collections. Fluid also extends along the Morrison's pouch in the RIGHT pericolic gutter. Noted vascular complication evident. Spleen: Normal spleen. Adrenals/urinary tract: Adrenal glands and kidneys are normal. Stomach/Bowel: Normal stomach. Small of fluid along the second portion duodenum. Limited view of the colon  unremarkable. Vascular/Lymphatic: No lymphadenopathy. Aorta normal. No vascular complication Musculoskeletal: No aggressive osseous lesion IMPRESSION: 1. Edema within the pancreatic head and fluid extending along the second portion duodenum and into Morrison's pouch is most consistent acute pancreatitis. Duodenitis felt less likely. 2. No pancreatic duct dilatation or obstruction identified. No pancreatic necrosis. No organized fluid  collections. 3. No choledocholithiasis.  No gallstones. 4. Hepatic steatosis. Electronically Signed   By: Genevive Bi M.D.   On: 02/12/2016 10:01   US Abdomen Limited Ruq  Result Date: 02/11/2016 CLINICAL DATA:  Acute onset of generalized abdominal pain. Initial encounter. EXAM: US ABDOMEN LIMITED - RIGHT UPPER QUADRANT COMPARISON:  CT of the abdomen and pelvis performed earlier today at 12:46 a.m. FINDINGS: Gallbladder: No gallstones or wall thickening visualized. No sonographic Murphy sign noted by sonographer. Common bile duct: Diameter: 0.5 cm, within normal limits in caliber. Liver: No focal lesion identified. Diffusely increased parenchymal echogenicity, compatible with fatty infiltration. Trace fluid is incidentally noted adjacent to the liver. IMPRESSION: 1. No acute abnormality seen at the right upper quadrant. 2. Diffuse fatty infiltration within the liver. 3. Trace ascites adjacent to the liver. Electronically Signed   By: Roanna Raider M.D.   On: 02/11/2016 05:07      Lab Results  Component Value Date   HGBA1C 5.9 (H) 10/29/2009   Lab Results  Component Value Date   LDLCALC 155 (H) 02/10/2016   CREATININE 0.66 02/12/2016       Scheduled Meds: . enoxaparin (LOVENOX) injection  40 mg Subcutaneous Daily  . Influenza vac split quadrivalent PF  0.5 mL Intramuscular Tomorrow-1000   Continuous Infusions: . sodium chloride 150 mL/hr at 02/12/16 1129     LOS: 1 day    Time spent: >30 MINS    [facility]  [facility] Pager [phone].  If 7PM-7AM, please contact night-coverage at [email or web address], password [email or web address] 02/12/2016, 1:23 PM  LOS: 1 day

## 2016-02-13 DIAGNOSIS — IMO0002 Reserved for concepts with insufficient information to code with codable children: Secondary | ICD-10-CM

## 2016-02-13 DIAGNOSIS — K859 Acute pancreatitis without necrosis or infection, unspecified: Secondary | ICD-10-CM

## 2016-02-13 LAB — GLUCOSE, CAPILLARY: GLUCOSE-CAPILLARY: 101 mg/dL — AB (ref 65–99)

## 2016-02-13 NOTE — Discharge Summary (Signed)
Physician Discharge Summary  Evelyn Dillon  ION:[id number]  DOB: Dec 21, 1960  DOA: 02/10/2016 PCP: No PCP Per Patient  Admit date: 02/10/2016 Discharge date: 02/13/2016  Admitted From: Home   Disposition:  Home   Recommendations for Outpatient Follow-up:  1. Follow up with PCP in 1 week 2. Please obtain BMP/CBC in one week  Home Health: None Equipment/Devices: None   Discharge Condition: Stable  CODE STATUS: Full  Diet recommendation: Heart Healthy   Brief/Interim Summary: 55 y/o F with medical history of pancreatitis and syncope presented to abdominal pain, nausea and vomiting. Admitted to pancreatitis was started on IVF kept NPO. Patient had a CT abd which showed peripancreatic edema without pancreatic necrosis without ductal dilatation. MRCP with edema within pancreatic head. Patient was advance on diet which tolerated well. Lipase came down to normal levels. Patient is pain free, clinically improved. She will be discharge home with follow up with PCP.   Subjective: Patient seen and examined at bedside. Tolerating diet well. Pain free. No acute event overnight. Remains afebrile.   Discharge Diagnoses:  Recurrent pancreatitis - Etiology not clear, no offensive medication, Trig 131, No ductal dilation by CT scan. - Resolved  Follow up with PCP   Discharge Instructions  Discharge Instructions    Call MD for:  difficulty breathing, headache or visual disturbances    Complete by:  As directed    Call MD for:  extreme fatigue    Complete by:  As directed    Call MD for:  hives    Complete by:  As directed    Call MD for:  persistant dizziness or light-headedness    Complete by:  As directed    Call MD for:  persistant nausea and vomiting    Complete by:  As directed    Call MD for:  redness, tenderness, or signs of infection (pain, swelling, redness, odor or green/yellow discharge around incision site)    Complete by:  As directed    Call MD for:  severe uncontrolled pain     Complete by:  As directed    Call MD for:  temperature >100.4    Complete by:  As directed    Diet - low sodium heart healthy    Complete by:  As directed    Increase activity slowly    Complete by:  As directed        Medication List    STOP taking these medications   meloxicam 7.5 MG tablet Commonly known as:  MOBIC       No Known Allergies  Consultations:  None   Procedures/Studies: Ct Abdomen Pelvis W Contrast  Result Date: 02/11/2016 CLINICAL DATA:  Abdominal pain, history of pancreatitis EXAM: CT ABDOMEN AND PELVIS WITH CONTRAST TECHNIQUE: Multidetector CT imaging of the abdomen and pelvis was performed using the standard protocol following bolus administration of intravenous contrast. CONTRAST:  100 mL of Isovue-300 IV COMPARISON:  01/04/2015 FINDINGS: Lower chest: There is borderline cardiomegaly without pericardial effusion. There is atelectasis at each lung base. Hepatobiliary: Fatty infiltration of the liver.  No gallstones. Pancreas: Peripancreatic edema without pancreatic necrosis. No ductal dilatation. No pancreatic mass. Spleen: No normal Adrenals/Urinary Tract: Normal bilateral adrenal glands. 1 mm calculus in the left upper pole of the kidney. Normal-appearing bladder. Stomach/Bowel: Peripancreatic edema outlines the second and third portion of the duodenum. There is sigmoid diverticulosis. Vascular/Lymphatic: No significant vascular findings are present. No enlarged abdominal or pelvic lymph nodes. No aortic aneurysm. Reproductive: Uterus and bilateral  adnexa are unremarkable. Other: No abdominal wall hernia or abnormality. Musculoskeletal: No acute or significant osseous findings. IMPRESSION: Findings in keeping with acute pancreatitis. Fatty appearing liver. Electronically Signed   By: Tollie Ethavid  Kwon M.D.   On: 02/11/2016 01:11   Mr 3d Recon At Scanner  Result Date: 02/12/2016 CLINICAL DATA:  Pt unable to participate in exam due to sedation meds. Unable to hold  breath. Best images sent 55 y.o. female with medical history significant of pancreatitis, syncope, who presents with abdominal pain, nausea and vomiting. Pt state that her abdominal pain started about 2 PM. It is located in the upper abdomen, constant, sharp, 10 out of 10 in severity, radiating to both side of abdomen and back. It is associated nausea and 2 episodes of NBNB. Denies EtOH, tobacco and drug usage. Pt had hx of pancreatitis last year with unclear etiology. EXAM: MRI ABDOMEN WITHOUT AND WITH CONTRAST (INCLUDING MRCP) TECHNIQUE: Multiplanar multisequence MR imaging of the abdomen was performed both before and after the administration of intravenous contrast. Heavily T2-weighted images of the biliary and pancreatic ducts were obtained, and three-dimensional MRCP images were rendered by post processing. CONTRAST:  16mL MULTIHANCE GADOBENATE DIMEGLUMINE 529 MG/ML IV SOLN COMPARISON:  CT 02/11/2016 FINDINGS: Pt unable to participate in exam due to sedation meds. Unable to hold breath. Best images sent Lower chest:  Lung bases are clear. Hepatobiliary: Loss of signal intensity on the opposed phase imaging (series 1401) consistent hepatic steatosis. Motion degradation noted. There is no intrahepatic duct dilatation. Common bile duct normal caliber of through the pancreatic head. No gallbladder distension. No gallstones present. Postcontrast imaging demonstrates no enhancing hepatic lesion. Pancreas: There is fluid along the pancreatic head and uncinate as well as the second portion duodenum. There is edema within the uncinate of the pancreas. There is no pancreatic duct dilatation. The pancreas enhances uniformly. Again motion degradation does limit the exam. No fluid collections. Fluid also extends along the Morrison's pouch in the RIGHT pericolic gutter. Noted vascular complication evident. Spleen: Normal spleen. Adrenals/urinary tract: Adrenal glands and kidneys are normal. Stomach/Bowel: Normal stomach.  Small of fluid along the second portion duodenum. Limited view of the colon unremarkable. Vascular/Lymphatic: No lymphadenopathy. Aorta normal. No vascular complication Musculoskeletal: No aggressive osseous lesion IMPRESSION: 1. Edema within the pancreatic head and fluid extending along the second portion duodenum and into Morrison's pouch is most consistent acute pancreatitis. Duodenitis felt less likely. 2. No pancreatic duct dilatation or obstruction identified. No pancreatic necrosis. No organized fluid collections. 3. No choledocholithiasis.  No gallstones. 4. Hepatic steatosis. Electronically Signed   By: Genevive BiStewart  Edmunds M.D.   On: 02/12/2016 10:01   Mr Abdomen Mrcp Vivien RossettiW Wo Contast  Result Date: 02/12/2016 CLINICAL DATA:  Pt unable to participate in exam due to sedation meds. Unable to hold breath. Best images sent 55 y.o. female with medical history significant of pancreatitis, syncope, who presents with abdominal pain, nausea and vomiting. Pt state that her abdominal pain started about 2 PM. It is located in the upper abdomen, constant, sharp, 10 out of 10 in severity, radiating to both side of abdomen and back. It is associated nausea and 2 episodes of NBNB. Denies EtOH, tobacco and drug usage. Pt had hx of pancreatitis last year with unclear etiology. EXAM: MRI ABDOMEN WITHOUT AND WITH CONTRAST (INCLUDING MRCP) TECHNIQUE: Multiplanar multisequence MR imaging of the abdomen was performed both before and after the administration of intravenous contrast. Heavily T2-weighted images of the biliary and pancreatic ducts were obtained,  and three-dimensional MRCP images were rendered by post processing. CONTRAST:  16mL MULTIHANCE GADOBENATE DIMEGLUMINE 529 MG/ML IV SOLN COMPARISON:  CT 02/11/2016 FINDINGS: Pt unable to participate in exam due to sedation meds. Unable to hold breath. Best images sent Lower chest:  Lung bases are clear. Hepatobiliary: Loss of signal intensity on the opposed phase imaging (series  1401) consistent hepatic steatosis. Motion degradation noted. There is no intrahepatic duct dilatation. Common bile duct normal caliber of through the pancreatic head. No gallbladder distension. No gallstones present. Postcontrast imaging demonstrates no enhancing hepatic lesion. Pancreas: There is fluid along the pancreatic head and uncinate as well as the second portion duodenum. There is edema within the uncinate of the pancreas. There is no pancreatic duct dilatation. The pancreas enhances uniformly. Again motion degradation does limit the exam. No fluid collections. Fluid also extends along the Morrison's pouch in the RIGHT pericolic gutter. Noted vascular complication evident. Spleen: Normal spleen. Adrenals/urinary tract: Adrenal glands and kidneys are normal. Stomach/Bowel: Normal stomach. Small of fluid along the second portion duodenum. Limited view of the colon unremarkable. Vascular/Lymphatic: No lymphadenopathy. Aorta normal. No vascular complication Musculoskeletal: No aggressive osseous lesion IMPRESSION: 1. Edema within the pancreatic head and fluid extending along the second portion duodenum and into Morrison's pouch is most consistent acute pancreatitis. Duodenitis felt less likely. 2. No pancreatic duct dilatation or obstruction identified. No pancreatic necrosis. No organized fluid collections. 3. No choledocholithiasis.  No gallstones. 4. Hepatic steatosis. Electronically Signed   By: Genevive BiStewart  Edmunds M.D.   On: 02/12/2016 10:01   [place] Abdomen Limited Ruq  Result Date: 02/11/2016 CLINICAL DATA:  Acute onset of generalized abdominal pain. Initial encounter. EXAM: US ABDOMEN LIMITED - RIGHT UPPER QUADRANT COMPARISON:  CT of the abdomen and pelvis performed earlier today at 12:46 a.m. FINDINGS: Gallbladder: No gallstones or wall thickening visualized. No sonographic Murphy sign noted by sonographer. Common bile duct: Diameter: 0.5 cm, within normal limits in caliber. Liver: No focal lesion  identified. Diffusely increased parenchymal echogenicity, compatible with fatty infiltration. Trace fluid is incidentally noted adjacent to the liver. IMPRESSION: 1. No acute abnormality seen at the right upper quadrant. 2. Diffuse fatty infiltration within the liver. 3. Trace ascites adjacent to the liver. Electronically Signed   By: Roanna RaiderJeffery  Chang M.D.   On: 02/11/2016 05:07      Discharge Exam: Vitals:   02/13/16 0541 02/13/16 0915  BP: 127/67 111/70  Pulse: 72 76  Resp: 16 18  Temp: 98.8 F (37.1 C) 98.3 F (36.8 C)   Vitals:   02/12/16 1711 02/12/16 2118 02/13/16 0541 02/13/16 0915  BP: 112/60 126/63 127/67 111/70  Pulse: 74 66 72 76  Resp: 18 18 16 18   Temp: 98.8 F (37.1 C) 98.7 F (37.1 C) 98.8 F (37.1 C) 98.3 F (36.8 C)  TempSrc: Oral Oral Oral Oral  SpO2: 99% 95% 97% 98%  Weight:  78.2 kg (172 lb 4.8 oz)      General: Pt is alert, awake, not in acute distress Cardiovascular: RRR, S1/S2 +, no rubs, no gallops Respiratory: CTA bilaterally, no wheezing, no rhonchi Abdominal: Soft, NT, ND, bowel sounds + Extremities: no edema, no cyanosis    The results of significant diagnostics from this hospitalization (including imaging, microbiology, ancillary and laboratory) are listed below for reference.     Microbiology: No results found for this or any previous visit (from the past 240 hour(s)).   Labs: BNP (last 3 results) No results for input(s): BNP in the last 8760  hours. Basic Metabolic Panel:  Recent Labs Lab 02/10/16 1718 02/11/16 0347 02/12/16 0439  NA 139 140 140  K 3.5 4.0 3.5  CL 109 109 107  CO2 21* 25 23  GLUCOSE 121* 104* 84  BUN 14 12 8   CREATININE 0.76 0.69 0.66  CALCIUM 9.0 8.5* 8.5*   Liver Function Tests:  Recent Labs Lab 02/10/16 1718 02/12/16 0439  AST 29 22  ALT 27 20  ALKPHOS 83 76  BILITOT 0.4 0.5  PROT 7.0 5.9*  ALBUMIN 4.1 3.2*    Recent Labs Lab 02/10/16 1718 02/11/16 1011 02/12/16 0439  LIPASE >3,000* 768*  49   No results for input(s): AMMONIA in the last 168 hours. CBC:  Recent Labs Lab 02/10/16 1718 02/11/16 0347 02/12/16 0439  WBC 6.9 5.2 4.9  HGB 12.6 11.6* 11.6*  HCT 37.9 35.7* 36.2  MCV 84.0 85.0 85.8  PLT 255 218 206   Cardiac Enzymes: No results for input(s): CKTOTAL, CKMB, CKMBINDEX, TROPONINI in the last 168 hours. BNP: Invalid input(s): POCBNP CBG:  Recent Labs Lab 02/12/16 0719 02/13/16 0749  GLUCAP 85 101*   D-Dimer No results for input(s): DDIMER in the last 72 hours. Hgb A1c No results for input(s): HGBA1C in the last 72 hours. Lipid Profile  Recent Labs  02/10/16 1718  CHOL 230*  HDL 49  LDLCALC 155*  TRIG 131  CHOLHDL 4.7   Thyroid function studies No results for input(s): TSH, T4TOTAL, T3FREE, THYROIDAB in the last 72 hours.  Invalid input(s): FREET3 Anemia work up No results for input(s): VITAMINB12, FOLATE, FERRITIN, TIBC, IRON, RETICCTPCT in the last 72 hours. Urinalysis    Component Value Date/Time   COLORURINE YELLOW 02/11/2016 0412   APPEARANCEUR CLEAR 02/11/2016 0412   LABSPEC 1.010 02/11/2016 0412   PHURINE 5.0 02/11/2016 0412   GLUCOSEU NEGATIVE 02/11/2016 0412   HGBUR NEGATIVE 02/11/2016 0412   BILIRUBINUR NEGATIVE 02/11/2016 0412   KETONESUR NEGATIVE 02/11/2016 0412   PROTEINUR NEGATIVE 02/11/2016 0412   UROBILINOGEN 0.2 01/03/2015 2238   NITRITE POSITIVE (A) 02/11/2016 0412   LEUKOCYTESUR NEGATIVE 02/11/2016 0412   Sepsis Labs Invalid input(s): PROCALCITONIN,  WBC,  LACTICIDVEN Microbiology No results found for this or any previous visit (from the past 240 hour(s)).  SIGNED:  Latrelle Dodrill, MD  [facility] 02/13/2016, 2:32 PM Pager   If 7PM-7AM, please contact night-coverage [email or web address] Password [email or web address]

## 2016-02-13 NOTE — Progress Notes (Signed)
Pt tolerated lunch. NO Nausea vomiting, or pain. Will discharge pt home. No needs at this time. IV removed.

## 2016-09-11 ENCOUNTER — Inpatient Hospital Stay (HOSPITAL_COMMUNITY)
Admission: EM | Admit: 2016-09-11 | Discharge: 2016-09-14 | DRG: 440 | Disposition: A | Discharge status: Home / Self Care | Payer: Self-pay | Attending: Family Medicine | Admitting: Family Medicine

## 2016-09-11 ENCOUNTER — Encounter (HOSPITAL_COMMUNITY): Payer: Self-pay

## 2016-09-11 DIAGNOSIS — K859 Acute pancreatitis without necrosis or infection, unspecified: Secondary | ICD-10-CM | POA: Diagnosis present

## 2016-09-11 DIAGNOSIS — Z789 Other specified health status: Secondary | ICD-10-CM | POA: Diagnosis present

## 2016-09-11 DIAGNOSIS — K861 Other chronic pancreatitis: Principal | ICD-10-CM | POA: Diagnosis present

## 2016-09-11 DIAGNOSIS — E876 Hypokalemia: Secondary | ICD-10-CM | POA: Diagnosis present

## 2016-09-11 DIAGNOSIS — K8500 Idiopathic acute pancreatitis without necrosis or infection: Secondary | ICD-10-CM

## 2016-09-11 DIAGNOSIS — K760 Fatty (change of) liver, not elsewhere classified: Secondary | ICD-10-CM | POA: Diagnosis present

## 2016-09-11 LAB — DIFFERENTIAL
BASOS PCT: 0 %
Basophils Absolute: 0 10*3/uL (ref 0.0–0.1)
EOS ABS: 0.1 10*3/uL (ref 0.0–0.7)
EOS PCT: 1 %
LYMPHS ABS: 4.3 10*3/uL — AB (ref 0.7–4.0)
Lymphocytes Relative: 57 %
MONO ABS: 0.4 10*3/uL (ref 0.1–1.0)
MONOS PCT: 5 %
Neutro Abs: 2.8 10*3/uL (ref 1.7–7.7)
Neutrophils Relative %: 37 %

## 2016-09-11 LAB — CBC
HEMATOCRIT: 40 % (ref 36.0–46.0)
HEMOGLOBIN: 13.1 g/dL (ref 12.0–15.0)
MCH: 28.6 pg (ref 26.0–34.0)
MCHC: 32.8 g/dL (ref 30.0–36.0)
MCV: 87.3 fL (ref 78.0–100.0)
Platelets: 233 10*3/uL (ref 150–400)
RBC: 4.58 MIL/uL (ref 3.87–5.11)
RDW: 13.3 % (ref 11.5–15.5)
WBC: 8.1 10*3/uL (ref 4.0–10.5)

## 2016-09-11 MED ORDER — SODIUM CHLORIDE 0.9 % IV BOLUS (SEPSIS)
1000.0000 mL | Freq: Once | INTRAVENOUS | Status: AC
  Administered 2016-09-11: 1000 mL via INTRAVENOUS

## 2016-09-11 MED ORDER — MORPHINE SULFATE (PF) 4 MG/ML IV SOLN
4.0000 mg | Freq: Once | INTRAVENOUS | Status: AC
Start: 2016-09-11 — End: 2016-09-11
  Administered 2016-09-11: 4 mg via INTRAVENOUS
  Filled 2016-09-11: qty 1

## 2016-09-11 MED ORDER — ONDANSETRON HCL 4 MG/2ML IJ SOLN
4.0000 mg | Freq: Once | INTRAMUSCULAR | Status: AC
  Administered 2016-09-11: 4 mg via INTRAVENOUS
  Filled 2016-09-11: qty 2

## 2016-09-11 NOTE — ED Triage Notes (Signed)
Pt reports upper abdominal pain that radiates to her back associated with nausea and vomiting; onset tonight just PTA after she finished eating dinner.

## 2016-09-11 NOTE — ED Provider Notes (Signed)
[facility]-EMERGENCY DEPT Provider Note   CSN: [id number] Arrival date & time: 09/11/16  2213     History   Chief Complaint Chief Complaint  Patient presents with  . Abdominal Pain    HPI Evelyn Dillon is a 56 y.o. female.  The history is provided by the patient.  Abdominal Pain    She has a history of pancreatitis. She started having pain across her upper abdomen about 2 hours ago. Pain radiates the back. Is associated nausea and vomiting. Nothing makes pain better nothing makes it worse. This episode is similar to prior episodes of pancreatitis. She is a nondrinker. Caused pancreatitis has never been determined. Currently, pain is rated at 10/10.  Past Medical History:  Diagnosis Date  . Kidney infection   . Pancreatitis   . Syncope    a. 04/2014: tele unrevealing, echo with normal EF & grade 2 DD, normal nuc. Etiology not clear.    Patient Active Problem List   Diagnosis Date Noted  . Recurrent pancreatitis (HCC)   . Major depressive disorder, recurrent episode (HCC) 01/13/2015  . Epigastric pain   . Pancreatitis 01/04/2015  . Upper abdominal pain   . Hypokalemia   . Facial trauma 05/07/2014  . Left arm pain 05/07/2014  . Syncope 05/06/2014  . ANEMIA, IRON DEFICIENCY, UNSPEC. 05/11/2006    Past Surgical History:  Procedure Laterality Date  . No prior surgery      OB History    No data available       Home Medications    Prior to Admission medications   Not on File    Family History Family History  Problem Relation Age of Onset  . Diabetes Mellitus I Sister   . Heart disease Sister        Died at 7138    Social History Social History  Substance Use Topics  . Smoking status: Never Smoker  . Smokeless tobacco: Never Used  . Alcohol use No     Allergies   Patient has no known allergies.   Review of Systems Review of Systems  Gastrointestinal: Positive for abdominal pain.  All other systems reviewed and are negative.    Physical  Exam Updated Vital Signs BP (!) 147/88 (BP Location: Left Arm)   Pulse 60   Temp 98.6 F (37 C) (Oral)   Resp 17   SpO2 98%   Physical Exam  Nursing note and vitals reviewed.  56 year old female in obvious pain. Vital signs are significant for hypertension. Oxygen saturation is 98%, which is normal. Head is normocephalic and atraumatic. PERRLA, EOMI. Oropharynx is clear. Neck is nontender and supple without adenopathy or JVD. Back is nontender and there is no CVA tenderness. Lungs are clear without rales, wheezes, or rhonchi. Chest is nontender. Heart has regular rate and rhythm without murmur. Abdomen is soft, flat, with moderate tenderness across the upper abdomen. There is no rebound or guarding. There are no masses or hepatosplenomegaly and peristalsis is hypoactive. Extremities have no cyanosis or edema, full range of motion is present. Skin is warm and dry without rash. Neurologic: Mental status is normal, cranial nerves are intact, there are no motor or sensory deficits.  ED Treatments / Results  Labs (all labs ordered are listed, but only abnormal results are displayed) Labs Reviewed  LIPASE, BLOOD - Abnormal; Notable for the following:       Result Value   Lipase 4,320 (*)    All other components within normal limits  COMPREHENSIVE METABOLIC PANEL - Abnormal; Notable for the following:    Potassium 3.1 (*)    Glucose, Bld 139 (*)    All other components within normal limits  URINALYSIS, ROUTINE W REFLEX MICROSCOPIC - Abnormal; Notable for the following:    Hgb urine dipstick SMALL (*)    Nitrite POSITIVE (*)    Bacteria, UA RARE (*)    Squamous Epithelial / LPF 0-5 (*)    All other components within normal limits  DIFFERENTIAL - Abnormal; Notable for the following:    Lymphs Abs 4.3 (*)    All other components within normal limits  URINE CULTURE  CBC    Procedures Procedures (including critical care time)  Medications Ordered in ED Medications  potassium  chloride 10 mEq in 100 mL IVPB (not administered)  sodium chloride 0.9 % bolus 1,000 mL (0 mLs Intravenous Stopped 09/12/16 0038)  ondansetron (ZOFRAN) injection 4 mg (4 mg Intravenous Given 09/11/16 2315)  morphine 4 MG/ML injection 4 mg (4 mg Intravenous Given 09/11/16 2315)  morphine 4 MG/ML injection 4 mg (4 mg Intravenous Given 09/12/16 0137)     Initial Impression / Assessment and Plan / ED Course  I have reviewed the triage vital signs and the nursing notes.  Pertinent labs & imaging results that were available during my care of the patient were reviewed by me and considered in my medical decision making (see chart for details).  About abdominal pain in patient with known history of pancreatitis. This is suspicious for recurrence of pancreatitis. Old records are reviewed showing a prior hospitalization for idiopathic pancreatitis. Screening labs are obtained and she'll be given IV fluids, morphine, ondansetron.  Laboratory workup shows lipase greater than 4000. Hypokalemia is present which is probably from vomiting. She's given intravenous potassium. Urine has positive nitrite, will send for culture. Following morphine, patient was more comfortable, but abdomen was still very tender. Ultrasound last November showed no gallstones, no need to repeat ultrasound. Case is discussed with Dr. Clyde Lundborg [facility] who agrees to admit the patient.  Final Clinical Impressions(s) / ED Diagnoses   Final diagnoses:  Idiopathic acute pancreatitis without infection or necrosis  Hypokalemia    New Prescriptions New Prescriptions   No medications on file     Dione Booze, MD 09/12/16 [phone]

## 2016-09-11 NOTE — ED Notes (Signed)
Patient's daughter, Suzette BattiestVeronica, leaving, if need to reach: [phone].

## 2016-09-12 ENCOUNTER — Encounter (HOSPITAL_COMMUNITY): Payer: Self-pay | Admitting: Internal Medicine

## 2016-09-12 ENCOUNTER — Observation Stay (HOSPITAL_COMMUNITY): Payer: Self-pay

## 2016-09-12 DIAGNOSIS — K8500 Idiopathic acute pancreatitis without necrosis or infection: Secondary | ICD-10-CM

## 2016-09-12 DIAGNOSIS — K859 Acute pancreatitis without necrosis or infection, unspecified: Secondary | ICD-10-CM | POA: Diagnosis present

## 2016-09-12 LAB — CBC
HCT: 36.5 % (ref 36.0–46.0)
HEMOGLOBIN: 12.1 g/dL (ref 12.0–15.0)
MCH: 28.7 pg (ref 26.0–34.0)
MCHC: 33.2 g/dL (ref 30.0–36.0)
MCV: 86.7 fL (ref 78.0–100.0)
PLATELETS: 180 10*3/uL (ref 150–400)
RBC: 4.21 MIL/uL (ref 3.87–5.11)
RDW: 13.3 % (ref 11.5–15.5)
WBC: 6.2 10*3/uL (ref 4.0–10.5)

## 2016-09-12 LAB — HIV ANTIBODY (ROUTINE TESTING W REFLEX): HIV SCREEN 4TH GENERATION: NONREACTIVE

## 2016-09-12 LAB — LIPID PANEL
Cholesterol: 200 mg/dL (ref 0–200)
HDL: 38 mg/dL — ABNORMAL LOW (ref >40)
LDL CALC: 151 mg/dL — AB (ref 0–99)
TRIGLYCERIDES: 55 mg/dL (ref <150)
Total CHOL/HDL Ratio: 5.3 RATIO
VLDL: 11 mg/dL (ref 0–40)

## 2016-09-12 LAB — LIPASE, BLOOD: LIPASE: 4320 U/L — AB (ref 11–51)

## 2016-09-12 LAB — URINALYSIS, ROUTINE W REFLEX MICROSCOPIC
Bilirubin Urine: NEGATIVE
GLUCOSE, UA: NEGATIVE mg/dL
KETONES UR: NEGATIVE mg/dL
LEUKOCYTES UA: NEGATIVE
NITRITE: POSITIVE — AB
PH: 5 (ref 5.0–8.0)
Protein, ur: NEGATIVE mg/dL
Specific Gravity, Urine: 1.015 (ref 1.005–1.030)

## 2016-09-12 LAB — COMPREHENSIVE METABOLIC PANEL
ALK PHOS: 70 U/L (ref 38–126)
ALT: 23 U/L (ref 14–54)
ALT: 21 U/L (ref 14–54)
ANION GAP: 6 (ref 5–15)
ANION GAP: 5 (ref 5–15)
AST: 29 U/L (ref 15–41)
AST: 26 U/L (ref 15–41)
Albumin: 4.2 g/dL (ref 3.5–5.0)
Albumin: 3.5 g/dL (ref 3.5–5.0)
Alkaline Phosphatase: 81 U/L (ref 38–126)
BILIRUBIN TOTAL: 0.4 mg/dL (ref 0.3–1.2)
BUN: 17 mg/dL (ref 6–20)
BUN: 14 mg/dL (ref 6–20)
CHLORIDE: 106 mmol/L (ref 101–111)
CO2: 24 mmol/L (ref 22–32)
CO2: 22 mmol/L (ref 22–32)
CREATININE: 0.61 mg/dL (ref 0.44–1.00)
Calcium: 9 mg/dL (ref 8.9–10.3)
Calcium: 8.1 mg/dL — ABNORMAL LOW (ref 8.9–10.3)
Chloride: 111 mmol/L (ref 101–111)
Creatinine, Ser: 0.82 mg/dL (ref 0.44–1.00)
Glucose, Bld: 139 mg/dL — ABNORMAL HIGH (ref 65–99)
Glucose, Bld: 140 mg/dL — ABNORMAL HIGH (ref 65–99)
POTASSIUM: 3.1 mmol/L — AB (ref 3.5–5.1)
Potassium: 4.2 mmol/L (ref 3.5–5.1)
Sodium: 136 mmol/L (ref 135–145)
Sodium: 138 mmol/L (ref 135–145)
TOTAL PROTEIN: 7.1 g/dL (ref 6.5–8.1)
Total Bilirubin: 0.4 mg/dL (ref 0.3–1.2)
Total Protein: 6 g/dL — ABNORMAL LOW (ref 6.5–8.1)

## 2016-09-12 LAB — MAGNESIUM: Magnesium: 2 mg/dL (ref 1.7–2.4)

## 2016-09-12 MED ORDER — ACETAMINOPHEN 325 MG PO TABS
650.0000 mg | ORAL_TABLET | Freq: Four times a day (QID) | ORAL | Status: DC | PRN
  Administered 2016-09-12 – 2016-09-13 (×3): 650 mg via ORAL
  Filled 2016-09-12 (×3): qty 2

## 2016-09-12 MED ORDER — ENOXAPARIN SODIUM 40 MG/0.4ML ~~LOC~~ SOLN
40.0000 mg | SUBCUTANEOUS | Status: DC
  Administered 2016-09-12 – 2016-09-14 (×3): 40 mg via SUBCUTANEOUS
  Filled 2016-09-12 (×3): qty 0.4

## 2016-09-12 MED ORDER — POTASSIUM CHLORIDE 10 MEQ/100ML IV SOLN
10.0000 meq | Freq: Once | INTRAVENOUS | Status: AC
  Administered 2016-09-12: 10 meq via INTRAVENOUS
  Filled 2016-09-12: qty 100

## 2016-09-12 MED ORDER — ONDANSETRON HCL 4 MG/2ML IJ SOLN
4.0000 mg | Freq: Three times a day (TID) | INTRAMUSCULAR | Status: DC | PRN
  Administered 2016-09-13: 4 mg via INTRAVENOUS
  Filled 2016-09-12: qty 2

## 2016-09-12 MED ORDER — MORPHINE SULFATE (PF) 4 MG/ML IV SOLN: 2.0000 mg | INTRAVENOUS | Status: DC | PRN

## 2016-09-12 MED ORDER — ACETAMINOPHEN 650 MG RE SUPP: 650.0000 mg | Freq: Four times a day (QID) | RECTAL | Status: DC | PRN

## 2016-09-12 MED ORDER — ZOLPIDEM TARTRATE 5 MG PO TABS: 5.0000 mg | ORAL_TABLET | Freq: Every evening | ORAL | Status: DC | PRN

## 2016-09-12 MED ORDER — POTASSIUM CHLORIDE 20 MEQ/15ML (10%) PO SOLN
40.0000 meq | Freq: Once | ORAL | Status: AC
Start: 2016-09-12 — End: 2016-09-12
  Administered 2016-09-12: 40 meq via ORAL
  Filled 2016-09-12: qty 30

## 2016-09-12 MED ORDER — MORPHINE SULFATE (PF) 4 MG/ML IV SOLN
4.0000 mg | Freq: Once | INTRAVENOUS | Status: AC
  Administered 2016-09-12: 4 mg via INTRAVENOUS
  Filled 2016-09-12: qty 1

## 2016-09-12 MED ORDER — SODIUM CHLORIDE 0.9 % IV SOLN
INTRAVENOUS | Status: DC
  Administered 2016-09-12 – 2016-09-13 (×4): via INTRAVENOUS

## 2016-09-12 NOTE — H&P (Addendum)
History and Physical    Evelyn PilarMaria I Kamiya ZOX:[id number] DOB: 02/19/1961 DOA: 09/11/2016  Referring MD/NP/PA:   PCP: Patient, No Pcp Per   Patient coming from:  The patient is coming from home.  At baseline, pt is independent for most of ADL.   Chief Complaint: Nausea, vomiting, abdominal pain  HPI: Evelyn Dillon is a 56 y.o. female with medical history significant of pancreatitis, syncope, who presents with abdominal pain, nausea and vomiting.  Pt state that her abdominal pain started last night after she finished eating dinner. It is located in the upper and left side of abdomen, constant, sharp, 10 out of 10 in severity, radiating to left side of back. It is associated nausea and 2 episodes of NBNB. Denies EtOH, tobacco and drug usage. Patient denies chest pain, shortness breath, fever, chills, diarrhea, symptoms of UTI or unilateral weakness. Pt had hx of recurrent pancreatitis with unclear etiology. She had negative abdominal ultrasound on 02/11/16 and normal triglyceride level.   ED Course: pt was found to have lipase 4320, calcium 9.0, negative urinalysis, potassium 3.1, creatinine normal, temperature normal, bradycardia, oxygen saturation 93-95% on room air. Patient is placed on MedSurg bed for observation.   Review of Systems:   General: no fevers, chills, no changes in body weight, has poor appetite, has fatigue HEENT: no blurry vision, hearing changes or sore throat Respiratory: no dyspnea, coughing, wheezing CV: no chest pain, no palpitations GI: has nausea, vomiting, abdominal pain, no diarrhea, constipation GU: no dysuria, burning on urination, increased urinary frequency, hematuria  Ext: no leg edema Neuro: no unilateral weakness, numbness, or tingling, no vision change or hearing loss Skin: no rash, no skin tear. MSK: No muscle spasm, no deformity, no limitation of range of movement in spin Heme: No easy bruising.  Travel history: No recent long distant travel.  Allergy:  No Known Allergies  Past Medical History:  Diagnosis Date  . Kidney infection   . Pancreatitis   . Syncope    a. 04/2014: tele unrevealing, echo with normal EF & grade 2 DD, normal nuc. Etiology not clear.    Past Surgical History:  Procedure Laterality Date  . TUBAL LIGATION     Patient states that she had birth control surgery, possible tubal ligation    Social History:  reports that she has never smoked. She has never used smokeless tobacco. She reports that she does not drink alcohol or use drugs.  Family History:  Family History  Problem Relation Age of Onset  . Diabetes Mellitus I Sister   . Heart disease Sister        Died at 5438     Prior to Admission medications   Not on File    Physical Exam: Vitals:   09/12/16 0215 09/12/16 0230 09/12/16 0245 09/12/16 0300  BP: 109/77 104/72 110/71 112/72  Pulse: 68 66 61 61  Resp:      Temp:      TempSrc:      SpO2:    93%   General: Not in acute distress HEENT:       Eyes: PERRL, EOMI, no scleral icterus.       ENT: No discharge from the ears and nose, no pharynx injection, no tonsillar enlargement.        Neck: No JVD, no bruit, no mass felt. Heme: No neck lymph node enlargement. Cardiac: S1/S2, RRR, No murmurs, No gallops or rubs. Respiratory: No rales, wheezing, rhonchi or rubs. GI: Soft, nondistended, has tenderness in  upper abdomen, no rebound pain, no organomegaly, BS present. GU: No hematuria Ext: No pitting leg edema bilaterally. 2+DP/PT pulse bilaterally. Musculoskeletal: No joint deformities, No joint redness or warmth, no limitation of ROM in spin. Skin: No rashes.  Neuro: Alert, oriented X3, cranial nerves II-XII grossly intact, moves all extremities normally. Psych: Patient is not psychotic, no suicidal or hemocidal ideation.  Labs on Admission: I have personally reviewed following labs and imaging studies  CBC:  Recent Labs Lab 09/11/16 2221  WBC 8.1  NEUTROABS 2.8  HGB 13.1  HCT 40.0  MCV  87.3  PLT 233   Basic Metabolic Panel:  Recent Labs Lab 09/11/16 2221  NA 136  K 3.1*  CL 106  CO2 24  GLUCOSE 139*  BUN 17  CREATININE 0.82  CALCIUM 9.0   GFR: CrCl cannot be calculated (Unknown ideal weight.). Liver Function Tests:  Recent Labs Lab 09/11/16 2221  AST 29  ALT 23  ALKPHOS 81  BILITOT 0.4  PROT 7.1  ALBUMIN 4.2    Recent Labs Lab 09/11/16 2221  LIPASE 4,320*   No results for input(s): AMMONIA in the last 168 hours. Coagulation Profile: No results for input(s): INR, PROTIME in the last 168 hours. Cardiac Enzymes: No results for input(s): CKTOTAL, CKMB, CKMBINDEX, TROPONINI in the last 168 hours. BNP (last 3 results) No results for input(s): PROBNP in the last 8760 hours. HbA1C: No results for input(s): HGBA1C in the last 72 hours. CBG: No results for input(s): GLUCAP in the last 168 hours. Lipid Profile: No results for input(s): CHOL, HDL, LDLCALC, TRIG, CHOLHDL, LDLDIRECT in the last 72 hours. Thyroid Function Tests: No results for input(s): TSH, T4TOTAL, FREET4, T3FREE, THYROIDAB in the last 72 hours. Anemia Panel: No results for input(s): VITAMINB12, FOLATE, FERRITIN, TIBC, IRON, RETICCTPCT in the last 72 hours. Urine analysis:    Component Value Date/Time   COLORURINE YELLOW 09/12/2016 0105   APPEARANCEUR CLEAR 09/12/2016 0105   LABSPEC 1.015 09/12/2016 0105   PHURINE 5.0 09/12/2016 0105   GLUCOSEU NEGATIVE 09/12/2016 0105   HGBUR SMALL (A) 09/12/2016 0105   BILIRUBINUR NEGATIVE 09/12/2016 0105   KETONESUR NEGATIVE 09/12/2016 0105   PROTEINUR NEGATIVE 09/12/2016 0105   UROBILINOGEN 0.2 01/03/2015 2238   NITRITE POSITIVE (A) 09/12/2016 0105   LEUKOCYTESUR NEGATIVE 09/12/2016 0105   Sepsis Labs: @LABRCNTIP (procalcitonin:4,lacticidven:4) )No results found for this or any previous visit (from the past 240 hour(s)).   Radiological Exams on Admission: No results found.   EKG: Reviewed independently, sinus rhythm, bradycardia,  QTC 411, poor R-wave progression.  Assessment/Plan Principal Problem:   Recurrent pancreatitis (HCC) Active Problems:   Hypokalemia   Recurrent pancreatitis (HCC): Pt had hx of recurrent pancreatitis with unclear etiology. She had negative abdominal ultrasound on 02/11/16 and normal triglyceride level. Her lipase is 4320, calcium 9.0. Clinically no signs of SIRS. Hemodynamically stable.  -will place on med-surg bed for observation -NPO for pancreatitis -IVF: 150 cc/hr -IV morphine for pain control, IV zofran for nausea -check lipid panel to r/o hypertriglyceridemia -check US-RUQ -check Ig G4 level for possible autoimmune pancreatitis  Hypokalemia: K=3.1 on admission. - Repleted - Check Mg level  DVT ppx: SQ Lovenox Code Status: Full code Family Communication: None at bed side.  Disposition Plan:  Anticipate discharge back to previous home environment Consults called:  none Admission status:  medical floor/obs    Date of Service 09/12/2016    Lorretta Harp [facility] Pager [phone]  If 7PM-7AM, please contact night-coverage [email or web address] Password [facility] 09/12/2016,  3:12 AM

## 2016-09-12 NOTE — Progress Notes (Signed)
Received via stretcher from ED. Belongings (clothes) brought with pt. Assessment as charted. Resting comfortably.No family with pt. Spoke with daughter via phone and informed of room number.

## 2016-09-12 NOTE — ED Notes (Signed)
Admitting MD at bedside.

## 2016-09-12 NOTE — Progress Notes (Signed)
[facility] PROGRESS NOTE    Progress Note  [name]  GMW:[id number] DOB: 1960-03-30 DOA: 09/11/2016 PCP: Patient, No Pcp Per     Brief Narrative:   Evelyn Dillon is an 56 y.o. female 55 y.o. female with medical history significant of pancreatitis, syncope, who presents with abdominal pain, nausea and vomiting.  Pt state that her abdominal pain started last night after she finished eating dinner. It is located in the upper and left side of abdomen, constant, sharp, 10 out of 10 in severity  Assessment/Plan:   Recurrent pancreatitis (HCC) Cont NPO, IV fluids. TG 55, abd [place] no acute abnormality Pt is still in pain  Hypokalemia Resolved after repletion.   DVT prophylaxis: lovenox Family Communication:none Disposition Plan/Barrier to D/C: home in 2-3 days Code Status:     Code Status Orders        Start     Ordered   09/12/16 0304  Full code  Continuous     09/12/16 0303    Code Status History    Date Active Date Inactive Code Status Order ID Comments User Context   02/11/2016  3:08 AM 02/13/2016  7:52 PM Full Code [id number]  Lorretta Harp, MD ED   01/04/2015  3:11 AM 01/06/2015  8:50 PM Full Code [id number]  Yolande Jolly, MD Inpatient   05/06/2014  4:23 PM 05/08/2014  5:52 PM Full Code [id number]  Lewayne Bunting, MD Inpatient        IV Access:    Peripheral IV   Procedures and diagnostic studies:   US Abdomen Limited Ruq  Result Date: 09/12/2016 CLINICAL DATA:  Pancreatitis.  Abdominal pain. EXAM: ULTRASOUND ABDOMEN LIMITED RIGHT UPPER QUADRANT COMPARISON:  CT, ultrasound, and MRI 02/11/2016 FINDINGS: Gallbladder: Physiologically distended. No gallstones or wall thickening visualized. No pericholecystic fluid. No sonographic Murphy sign noted by sonographer. Common bile duct: Diameter: 5 mm. Liver: No focal lesion identified. Diffusely increased limits in parenchymal echogenicity. Normal directional flow in the main portal vein. IMPRESSION: 1. Normal  sonographic appearance of gallbladder and biliary tree. 2. Hepatic steatosis. Electronically Signed   By: Rubye Oaks M.D.   On: 09/12/2016 03:53     Medical Consultants:    None.  Anti-Infectives:   None  Subjective:    [name] still in pain and nauseated  Objective:    Vitals:   09/12/16 0245 09/12/16 0300 09/12/16 0459 09/12/16 0500  BP: 110/71 112/72  113/66  Pulse: 61 61  (!) 51  Resp:    18  Temp:    97.4 F (36.3 C)  TempSrc:    Oral  SpO2:  93%  97%  Weight:   69.8 kg (153 lb 14.4 oz)   Height:   5' (1.524 m)     Intake/Output Summary (Last 24 hours) at 09/12/16 1001 Last data filed at 09/12/16 0700  Gross per 24 hour  Intake             1250 ml  Output                0 ml  Net             1250 ml   Filed Weights   09/12/16 0459  Weight: 69.8 kg (153 lb 14.4 oz)    Exam: General exam: In no acute distress. Respiratory system: Good air movement and clear to auscultation. Cardiovascular system: Regular rate and rhythm positive S1-S2 Gastrointestinal system: Abdomen is soft with positive bowel sounds  mild epigastric tenderness no rebound or guarding. Central nervous system: He is awake alert oriented 3 Extremities: No lower extremity edema. Skin: No rashes, lesions or ulcers Psychiatry: Judgment and insight and mood appeared appropriate.   Data Reviewed:    Labs: Basic Metabolic Panel:  Recent Labs Lab 09/11/16 2221 09/12/16 0313  NA 136 138  K 3.1* 4.2  CL 106 111  CO2 24 22  GLUCOSE 139* 140*  BUN 17 14  CREATININE 0.82 0.61  CALCIUM 9.0 8.1*  MG  --  2.0   GFR Estimated Creatinine Clearance: 68.4 mL/min (by C-G formula based on SCr of 0.61 mg/dL). Liver Function Tests:  Recent Labs Lab 09/11/16 2221 09/12/16 0313  AST 29 26  ALT 23 21  ALKPHOS 81 70  BILITOT 0.4 0.4  PROT 7.1 6.0*  ALBUMIN 4.2 3.5    Recent Labs Lab 09/11/16 2221  LIPASE 4,320*   No results for input(s): AMMONIA in the last 168  hours. Coagulation profile No results for input(s): INR, PROTIME in the last 168 hours.  CBC:  Recent Labs Lab 09/11/16 2221 09/12/16 0313  WBC 8.1 6.2  NEUTROABS 2.8  --   HGB 13.1 12.1  HCT 40.0 36.5  MCV 87.3 86.7  PLT 233 180   Cardiac Enzymes: No results for input(s): CKTOTAL, CKMB, CKMBINDEX, TROPONINI in the last 168 hours. BNP (last 3 results) No results for input(s): PROBNP in the last 8760 hours. CBG: No results for input(s): GLUCAP in the last 168 hours. D-Dimer: No results for input(s): DDIMER in the last 72 hours. Hgb A1c: No results for input(s): HGBA1C in the last 72 hours. Lipid Profile:  Recent Labs  09/12/16 0313  CHOL 200  HDL 38*  LDLCALC 151*  TRIG 55  CHOLHDL 5.3   Thyroid function studies: No results for input(s): TSH, T4TOTAL, T3FREE, THYROIDAB in the last 72 hours.  Invalid input(s): FREET3 Anemia work up: No results for input(s): VITAMINB12, FOLATE, FERRITIN, TIBC, IRON, RETICCTPCT in the last 72 hours. Sepsis Labs:  Recent Labs Lab 09/11/16 2221 09/12/16 0313  WBC 8.1 6.2   Microbiology No results found for this or any previous visit (from the past 240 hour(s)).   Medications:   . enoxaparin (LOVENOX) injection  40 mg Subcutaneous Q24H   Continuous Infusions: . sodium chloride 150 mL/hr at 09/12/16 0645    Time spent: 25 min   LOS: 0 days   Evelyn Dillon  [facility] Pager [phone]  *Please refer to [email or web address], password [email or web address] to get updated schedule on who will round on this patient, as hospitalists switch teams weekly. If 7PM-7AM, please contact night-coverage at [email or web address], password [email or web address] for any overnight needs.  09/12/2016, 10:01 AM

## 2016-09-13 LAB — IGG 4: IgG, Subclass 4: 48 mg/dL (ref 2–96)

## 2016-09-13 LAB — BASIC METABOLIC PANEL
ANION GAP: 6 (ref 5–15)
BUN: 7 mg/dL (ref 6–20)
CO2: 23 mmol/L (ref 22–32)
Calcium: 8.4 mg/dL — ABNORMAL LOW (ref 8.9–10.3)
Chloride: 111 mmol/L (ref 101–111)
Creatinine, Ser: 0.6 mg/dL (ref 0.44–1.00)
GFR calc Af Amer: >60 mL/min (ref >60)
GLUCOSE: 87 mg/dL (ref 65–99)
POTASSIUM: 4.2 mmol/L (ref 3.5–5.1)
Sodium: 140 mmol/L (ref 135–145)

## 2016-09-13 LAB — GLUCOSE, CAPILLARY: GLUCOSE-CAPILLARY: 75 mg/dL (ref 65–99)

## 2016-09-13 MED ORDER — ACETAMINOPHEN 325 MG PO TABS
650.0000 mg | ORAL_TABLET | Freq: Four times a day (QID) | ORAL | Status: DC | PRN
  Administered 2016-09-13 (×2): 650 mg via ORAL
  Filled 2016-09-13 (×2): qty 2

## 2016-09-13 MED ORDER — SODIUM CHLORIDE 0.9 % IV SOLN: INTRAVENOUS | Status: AC

## 2016-09-13 NOTE — Progress Notes (Signed)
Interpreter Graciela Namihira for patient °

## 2016-09-13 NOTE — Progress Notes (Addendum)
[facility] PROGRESS NOTE    Progress Note  Evelyn Dillon  ZOX:[id number] DOB: 04/21/1960 DOA: 09/11/2016 PCP: Patient, No Pcp Per     Brief Narrative:   Evelyn Dillon is an 56 y.o. female 56 y.o. female with medical history significant of pancreatitis, syncope, who presents with abdominal pain, nausea and vomiting.  Pt state that her abdominal pain started last night after she finished eating dinner. It is located in the upper and left side of abdomen, constant, sharp, 10 out of 10 in severity  Assessment/Plan:   Recurrent pancreatitis (HCC) She has no further abdominal pain, she  like to try a full liquid Dyazide as tolerated and probably be discharged in the afternoon as she tolerates her diet.  Hypokalemia Resovled.   DVT prophylaxis: lovenox Family Communication:none Disposition Plan/Barrier to D/C: home in am Code Status:     Code Status Orders        Start     Ordered   09/12/16 0304  Full code  Continuous     09/12/16 0303    Code Status History    Date Active Date Inactive Code Status Order ID Comments User Context   02/11/2016  3:08 AM 02/13/2016  7:52 PM Full Code [id number]  Lorretta Harp, MD ED   01/04/2015  3:11 AM 01/06/2015  8:50 PM Full Code [id number]  Yolande Jolly, MD Inpatient   05/06/2014  4:23 PM 05/08/2014  5:52 PM Full Code [id number]  Lewayne Bunting, MD Inpatient        IV Access:    Peripheral IV   Procedures and diagnostic studies:   US Abdomen Limited Ruq  Result Date: 09/12/2016 CLINICAL DATA:  Pancreatitis.  Abdominal pain. EXAM: ULTRASOUND ABDOMEN LIMITED RIGHT UPPER QUADRANT COMPARISON:  CT, ultrasound, and MRI 02/11/2016 FINDINGS: Gallbladder: Physiologically distended. No gallstones or wall thickening visualized. No pericholecystic fluid. No sonographic Murphy sign noted by sonographer. Common bile duct: Diameter: 5 mm. Liver: No focal lesion identified. Diffusely increased limits in parenchymal echogenicity. Normal  directional flow in the main portal vein. IMPRESSION: 1. Normal sonographic appearance of gallbladder and biliary tree. 2. Hepatic steatosis. Electronically Signed   By: Rubye Oaks M.D.   On: 09/12/2016 03:53     Medical Consultants:    None.  Anti-Infectives:   None  Subjective:    Evelyn Dillon pain is resolved no further nausea has not had any pain in the last 24 hours.  Objective:    Vitals:   09/12/16 0912 09/12/16 1724 09/12/16 2048 09/13/16 0411  BP: 106/64 105/69 (!) 117/59 114/63  Pulse: (!) 55 61 (!) 52 63  Resp: 17 18 15 14   Temp: 97.3 F (36.3 C) 98.3 F (36.8 C) 98.6 F (37 C) 98.6 F (37 C)  TempSrc: Oral Oral    SpO2: 96% 97% 96% 97%  Weight:   70.3 kg (155 lb)   Height:        Intake/Output Summary (Last 24 hours) at 09/13/16 0815 Last data filed at 09/13/16 0600  Gross per 24 hour  Intake             3510 ml  Output              400 ml  Net             3110 ml   Filed Weights   09/12/16 0459 09/12/16 2048  Weight: 69.8 kg (153 lb 14.4 oz) 70.3 kg (155 lb)    Exam:  General exam: In no acute distress Respiratory system: Good air movement clear to auscultation Cardiovascular system: Regular rate and rhythm with positive S1-S2 Gastrointestinal system: Abdomen is soft, nontender Central nervous system: Awake alert and oriented 3. Extremities: No lower extremity edema. Skin: No rashes. Psychiatry: Judgment and insight and mood appeared appropriate.   Data Reviewed:    Labs: Basic Metabolic Panel:  Recent Labs Lab 09/11/16 2221 09/12/16 0313  NA 136 138  K 3.1* 4.2  CL 106 111  CO2 24 22  GLUCOSE 139* 140*  BUN 17 14  CREATININE 0.82 0.61  CALCIUM 9.0 8.1*  MG  --  2.0   GFR Estimated Creatinine Clearance: 68.7 mL/min (by C-G formula based on SCr of 0.61 mg/dL). Liver Function Tests:  Recent Labs Lab 09/11/16 2221 09/12/16 0313  AST 29 26  ALT 23 21  ALKPHOS 81 70  BILITOT 0.4 0.4  PROT 7.1 6.0*  ALBUMIN 4.2  3.5    Recent Labs Lab 09/11/16 2221  LIPASE 4,320*   No results for input(s): AMMONIA in the last 168 hours. Coagulation profile No results for input(s): INR, PROTIME in the last 168 hours.  CBC:  Recent Labs Lab 09/11/16 2221 09/12/16 0313  WBC 8.1 6.2  NEUTROABS 2.8  --   HGB 13.1 12.1  HCT 40.0 36.5  MCV 87.3 86.7  PLT 233 180   Cardiac Enzymes: No results for input(s): CKTOTAL, CKMB, CKMBINDEX, TROPONINI in the last 168 hours. BNP (last 3 results) No results for input(s): PROBNP in the last 8760 hours. CBG:  Recent Labs Lab 09/13/16 0753  GLUCAP 75   D-Dimer: No results for input(s): DDIMER in the last 72 hours. Hgb A1c: No results for input(s): HGBA1C in the last 72 hours. Lipid Profile:  Recent Labs  09/12/16 0313  CHOL 200  HDL 38*  LDLCALC 151*  TRIG 55  CHOLHDL 5.3   Thyroid function studies: No results for input(s): TSH, T4TOTAL, T3FREE, THYROIDAB in the last 72 hours.  Invalid input(s): FREET3 Anemia work up: No results for input(s): VITAMINB12, FOLATE, FERRITIN, TIBC, IRON, RETICCTPCT in the last 72 hours. Sepsis Labs:  Recent Labs Lab 09/11/16 2221 09/12/16 0313  WBC 8.1 6.2   Microbiology No results found for this or any previous visit (from the past 240 hour(s)).   Medications:   . enoxaparin (LOVENOX) injection  40 mg Subcutaneous Q24H   Continuous Infusions: . sodium chloride 150 mL/hr at 09/13/16 0622    Time spent: 25 min   LOS: 1 day   Marinda ElkFELIZ ORTIZ, Melessa Cowell  [facility] Pager [phone]  *Please refer to [email or web address], password [email or web address] to get updated schedule on who will round on this patient, as hospitalists switch teams weekly. If 7PM-7AM, please contact night-coverage at [email or web address], password [email or web address] for any overnight needs.  09/13/2016, 8:15 AM

## 2016-09-13 NOTE — Care Management Note (Signed)
Case Management Note  Patient Details  Name: Evelyn Dillon MRN: [id number] Date of Birth: 09-Sep-1960  Subjective/Objective:                 Patient has follow up appointment 09-20-16 at 13:30 to establish care. Patient can fill Rx there at DC. From home w family and independent prior to admission.    Action/Plan:   Expected Discharge Date:  09/13/16               Expected Discharge Plan:  Home/Self Care  In-House Referral:     Discharge planning Services  CM Consult, Indigent [facility]  Post Acute Care Choice:    Choice offered to:     DME Arranged:    DME Agency:     HH Arranged:    HH Agency:     Status of Service:  Completed, signed off  If discussed at [employer] Meetings, dates discussed:    Additional Comments:  Lawerance SabalDebbie Gilda Abboud, RN 09/13/2016, 2:25 PM

## 2016-09-14 DIAGNOSIS — K861 Other chronic pancreatitis: Principal | ICD-10-CM

## 2016-09-14 DIAGNOSIS — E876 Hypokalemia: Secondary | ICD-10-CM

## 2016-09-14 DIAGNOSIS — Z789 Other specified health status: Secondary | ICD-10-CM | POA: Diagnosis present

## 2016-09-14 LAB — BASIC METABOLIC PANEL
ANION GAP: 4 — AB (ref 5–15)
BUN: 9 mg/dL (ref 6–20)
CHLORIDE: 107 mmol/L (ref 101–111)
CO2: 27 mmol/L (ref 22–32)
Calcium: 8.9 mg/dL (ref 8.9–10.3)
Creatinine, Ser: 0.76 mg/dL (ref 0.44–1.00)
GFR calc Af Amer: >60 mL/min (ref >60)
GFR calc non Af Amer: >60 mL/min (ref >60)
GLUCOSE: 104 mg/dL — AB (ref 65–99)
POTASSIUM: 4.2 mmol/L (ref 3.5–5.1)
SODIUM: 138 mmol/L (ref 135–145)

## 2016-09-14 LAB — GLUCOSE, CAPILLARY: GLUCOSE-CAPILLARY: 86 mg/dL (ref 65–99)

## 2016-09-14 NOTE — Discharge Summary (Signed)
Physician Discharge Summary  Evelyn Dillon:[id number] DOB: 07/26/1960 DOA: 09/11/2016  [facility]  Admit date: 09/11/2016 Discharge date: 09/14/2016  Admitted From: Home  Disposition: Home  Recommendations for Outpatient Follow-up:  1. Follow up with PCP in 1 weeks as scheduled 2. Please obtain BMP/CBC in one week  Discharge Condition:  STABLE   CODE STATUS: FULL    Brief Hospitalization Summary: Please see all hospital notes, images, labs for full details of the hospitalization. HPI: Evelyn Dillon is a 56 y.o. female with medical history significant of pancreatitis, syncope, who presents with abdominal pain, nausea and vomiting.  Pt state that her abdominal pain started last night after she finished eating dinner. It is located in the upper and left side of abdomen, constant, sharp, 10 out of 10 in severity, radiating to left side of back. It is associated nausea and 2 episodes of NBNB. Denies EtOH, tobacco and drug usage. Patient denies chest pain, shortness breath, fever, chills, diarrhea, symptoms of UTI or unilateral weakness. Pt had hx of recurrent pancreatitis with unclear etiology. She had negative abdominal ultrasound on 02/11/16 and normal triglyceride level.  ED Course: pt was found to have lipase 4320, calcium 9.0, negative urinalysis, potassium 3.1, creatinine normal, temperature normal, bradycardia, oxygen saturation 93-95% on room air. Patient is placed on MedSurg bed for observation.   Recurrent cryptogenic pancreatitis  Treated with NPO, IV fluid and as symptoms improved slowly advanced diet and tolerated. . TG 55, abd [place] no acute abnormality Abd pain resolved and tolerating diet, will discharge home with outpatient follow up.  No cause for the pancreatitis was identified. She may benefit from outpatient GI consult as arranged through her PCP.    Hypokalemia Resolved after repletion.  DVT prophylaxis: lovenox Family  Communication:none Disposition Plan/Barrier to D/C: home  Discharge Diagnoses:  Principal Problem:   Recurrent pancreatitis (HCC) Active Problems:   Hypokalemia   Pancreatitis    Discharge Instructions: Discharge Instructions    Diet - low sodium heart healthy    Complete by:  As directed    Increase activity slowly    Complete by:  As directed      Allergies as of 09/14/2016   No Known Allergies     Medication List    You have not been prescribed any medications.    Follow-up Information    [facility]. Go on 09/20/2016.   Why:  at 1:30pm for appointnment. You may fill your prescriptions here the day you are discharged.  Contact information: [address] [place] [address] [phone]         No Known Allergies There are no discharge medications for this patient.   Procedures/Studies: US Abdomen Limited Ruq  Result Date: 09/12/2016 CLINICAL DATA:  Pancreatitis.  Abdominal pain. EXAM: ULTRASOUND ABDOMEN LIMITED RIGHT UPPER QUADRANT COMPARISON:  CT, ultrasound, and MRI 02/11/2016 FINDINGS: Gallbladder: Physiologically distended. No gallstones or wall thickening visualized. No pericholecystic fluid. No sonographic Murphy sign noted by sonographer. Common bile duct: Diameter: 5 mm. Liver: No focal lesion identified. Diffusely increased limits in parenchymal echogenicity. Normal directional flow in the main portal vein. IMPRESSION: 1. Normal sonographic appearance of gallbladder and biliary tree. 2. Hepatic steatosis. Electronically Signed   By: Rubye Oaks M.D.   On: 09/12/2016 03:53      Subjective: Interpreter used to communicate with patient.  She says she feels better, tolerated diet and abdominal pain resolved.    Discharge Exam: Vitals:  09/14/16 0500 09/14/16 1000  BP: 116/68 113/65  Pulse: (!) 53 65  Resp: 18 18  Temp: 98.6 F (37 C) 98.1 F (36.7 C)   Vitals:   09/13/16 1721 09/13/16 2044  09/14/16 0500 09/14/16 1000  BP: 111/60 (!) 107/49 116/68 113/65  Pulse: 65 62 (!) 53 65  Resp: 18  18 18   Temp: 98.8 F (37.1 C) 98.6 F (37 C) 98.6 F (37 C) 98.1 F (36.7 C)  TempSrc: Oral Oral Oral Oral  SpO2: 98% 95% 96% 98%  Weight:  69.1 kg (152 lb 6.4 oz)    Height:        General: Pt is alert, awake, not in acute distress Cardiovascular: RRR, S1/S2 +, no rubs, no gallops Respiratory: CTA bilaterally, no wheezing, no rhonchi Abdominal: Soft, NT, ND, bowel sounds + Extremities: no edema, no cyanosis   The results of significant diagnostics from this hospitalization (including imaging, microbiology, ancillary and laboratory) are listed below for reference.     Microbiology: No results found for this or any previous visit (from the past 240 hour(s)).   Labs: BNP (last 3 results) No results for input(s): BNP in the last 8760 hours. Basic Metabolic Panel:  Recent Labs Lab 09/11/16 2221 09/12/16 0313 09/13/16 0849 09/14/16 0444  NA 136 138 140 138  K 3.1* 4.2 4.2 4.2  CL 106 111 111 107  CO2 24 22 23 27   GLUCOSE 139* 140* 87 104*  BUN 17 14 7 9   CREATININE 0.82 0.61 0.60 0.76  CALCIUM 9.0 8.1* 8.4* 8.9  MG  --  2.0  --   --    Liver Function Tests:  Recent Labs Lab 09/11/16 2221 09/12/16 0313  AST 29 26  ALT 23 21  ALKPHOS 81 70  BILITOT 0.4 0.4  PROT 7.1 6.0*  ALBUMIN 4.2 3.5    Recent Labs Lab 09/11/16 2221  LIPASE 4,320*   No results for input(s): AMMONIA in the last 168 hours. CBC:  Recent Labs Lab 09/11/16 2221 09/12/16 0313  WBC 8.1 6.2  NEUTROABS 2.8  --   HGB 13.1 12.1  HCT 40.0 36.5  MCV 87.3 86.7  PLT 233 180   Cardiac Enzymes: No results for input(s): CKTOTAL, CKMB, CKMBINDEX, TROPONINI in the last 168 hours. BNP: Invalid input(s): POCBNP CBG:  Recent Labs Lab 09/13/16 0753 09/14/16 0739  GLUCAP 75 86   D-Dimer No results for input(s): DDIMER in the last 72 hours. Hgb A1c No results for input(s): HGBA1C in  the last 72 hours. Lipid Profile  Recent Labs  09/12/16 0313  CHOL 200  HDL 38*  LDLCALC 151*  TRIG 55  CHOLHDL 5.3   Thyroid function studies No results for input(s): TSH, T4TOTAL, T3FREE, THYROIDAB in the last 72 hours.  Invalid input(s): FREET3 Anemia work up No results for input(s): VITAMINB12, FOLATE, FERRITIN, TIBC, IRON, RETICCTPCT in the last 72 hours. Urinalysis    Component Value Date/Time   COLORURINE YELLOW 09/12/2016 0105   APPEARANCEUR CLEAR 09/12/2016 0105   LABSPEC 1.015 09/12/2016 0105   PHURINE 5.0 09/12/2016 0105   GLUCOSEU NEGATIVE 09/12/2016 0105   HGBUR SMALL (A) 09/12/2016 0105   BILIRUBINUR NEGATIVE 09/12/2016 0105   KETONESUR NEGATIVE 09/12/2016 0105   PROTEINUR NEGATIVE 09/12/2016 0105   UROBILINOGEN 0.2 01/03/2015 2238   NITRITE POSITIVE (A) 09/12/2016 0105   LEUKOCYTESUR NEGATIVE 09/12/2016 0105   Sepsis Labs Invalid input(s): PROCALCITONIN,  WBC,  LACTICIDVEN Microbiology No results found for this or any previous visit (from  the past 240 hour(s)).  Time coordinating discharge: 27 mins  SIGNED:  Standley Dakinslanford Terree Gaultney, MD  [facility] 09/14/2016, 11:38 AM Pager [phone]  If 7PM-7AM, please contact night-coverage [email or web address] Password [email or web address]

## 2016-09-14 NOTE — Progress Notes (Signed)
Evelyn Dillon to be D/C'd Home per MD order.  Discussed prescriptions and follow up appointments with the patient. Prescriptions given to patient, medication list explained in detail. Pt verbalized understanding.  Allergies as of 09/14/2016   No Known Allergies     Medication List    You have not been prescribed any medications.     Vitals:   09/14/16 0500 09/14/16 1000  BP: 116/68 113/65  Pulse: (!) 53 65  Resp: 18 18  Temp: 98.6 F (37 C) 98.1 F (36.7 C)    Skin clean, dry and intact without evidence of skin break down, no evidence of skin tears noted. IV catheter discontinued intact. Site without signs and symptoms of complications. Dressing and pressure applied. Pt denies pain at this time. No complaints noted.  An After Visit Summary was printed and given to the patient. Patient escorted via WC, and D/C home via private auto.  Nelma RothmanNatalie Cadel Stairs, RN [facility] Phone [phone]

## 2016-09-20 ENCOUNTER — Encounter: Payer: Self-pay | Admitting: Physician Assistant

## 2016-09-20 ENCOUNTER — Ambulatory Visit: Payer: Self-pay | Attending: Physician Assistant | Admitting: Physician Assistant

## 2016-09-20 VITALS — BP 131/79 | HR 83 | Temp 98.5°F | Resp 16 | Wt 150.6 lb

## 2016-09-20 DIAGNOSIS — F329 Major depressive disorder, single episode, unspecified: Secondary | ICD-10-CM | POA: Insufficient documentation

## 2016-09-20 DIAGNOSIS — K858 Other acute pancreatitis without necrosis or infection: Secondary | ICD-10-CM

## 2016-09-20 DIAGNOSIS — K859 Acute pancreatitis without necrosis or infection, unspecified: Secondary | ICD-10-CM | POA: Insufficient documentation

## 2016-09-20 NOTE — Progress Notes (Signed)
Chief Complaint: Hospital follow up  Subjective: 56 year old female with a history of pancreatitis and depression presented to the emergency department on 11/13/2016 with complaints of abdominal pain radiating to the nausea and vomiting. Usually occurring after meals. Located to the epigastric area. No fevers or chills. Lipase found to be greater than 4000. Potassium 3.1. Creatinine normal. Triglycerides normal. She was admitted by the medicine team. She was given IV fluids and symptomatic relief. Her right upper quadrant ultrasound was negative. Her IgG was normal. Her HIV was nonreactive.  She was not released on any change in medications. She's feeling better. No abdominal pain. No nausea or vomiting. Increase in her appetite and meal consumption. No fevers. No chills.   ROS:  GEN: denies fever or chills, denies change in weight Skin: denies lesions or rashes HEENT: denies headache, earache, epistaxis, sore throat, or neck pain LUNGS: denies SHOB, dyspnea, PND, orthopnea CV: denies CP or palpitations ABD: denies abd pain, N or V EXT: denies muscle spasms or swelling; no pain in lower ext, no weakness NEURO: denies numbness or tingling, denies sz, stroke or TIA   Objective:  Vitals:   09/20/16 1354  BP: 131/79  Pulse: 83  Resp: 16  Temp: 98.5 F (36.9 C)  TempSrc: Oral  SpO2: 97%  Weight: 150 lb 9.6 oz (68.3 kg)    Physical Exam: benign  General: in no acute distress. Heart: Normal  s1 &s2  Regular rate and rhythm, without murmurs, rubs, gallops. Lungs: Clear to auscultation bilaterally. Abdomen: Soft, nontender, nondistended, positive bowel sounds. Extremities: No clubbing cyanosis or edema with positive pedal pulses.  Medications: Prior to Admission medications   Not on File    Assessment: Pancreatitis, recurrent   Plan: Cont supportive care CMP, LFTs, lipase today  Follow up: 3-4 weeks Dr. Venetia NightAmao  The patient was given clear instructions to go to ER or  return to medical center if symptoms don't improve, worsen or new problems develop. The patient verbalized understanding. The patient was told to call to get lab results if they haven't heard anything in the next week.   This note has been created with Education officer, environmentalDragon speech recognition software and smart phrase technology. Any transcriptional errors are unintentional.   Scot Juniffany Khyrin Trevathan, PA-C 09/20/2016, 2:06 PM

## 2016-09-21 LAB — LIPASE: Lipase: 18 U/L (ref 14–72)

## 2016-09-21 LAB — CMP AND LIVER
ALT: 29 IU/L (ref 0–32)
AST: 24 IU/L (ref 0–40)
Albumin: 4.4 g/dL (ref 3.5–5.5)
Alkaline Phosphatase: 105 IU/L (ref 39–117)
BILIRUBIN, DIRECT: 0.08 mg/dL (ref 0.00–0.40)
BUN: 14 mg/dL (ref 6–24)
Bilirubin Total: 0.3 mg/dL (ref 0.0–1.2)
CO2: 23 mmol/L (ref 20–29)
Calcium: 9.5 mg/dL (ref 8.7–10.2)
Chloride: 104 mmol/L (ref 96–106)
Creatinine, Ser: 0.77 mg/dL (ref 0.57–1.00)
GFR calc Af Amer: 100 mL/min/{1.73_m2} (ref >59)
GFR, EST NON AFRICAN AMERICAN: 87 mL/min/{1.73_m2} (ref >59)
GLUCOSE: 97 mg/dL (ref 65–99)
Potassium: 4.2 mmol/L (ref 3.5–5.2)
Sodium: 142 mmol/L (ref 134–144)
Total Protein: 7.3 g/dL (ref 6.0–8.5)

## 2016-09-23 ENCOUNTER — Ambulatory Visit: Payer: Self-pay | Attending: Internal Medicine

## 2016-10-14 ENCOUNTER — Ambulatory Visit: Payer: Self-pay | Attending: Family Medicine | Admitting: Family Medicine

## 2016-10-14 ENCOUNTER — Encounter: Payer: Self-pay | Admitting: Family Medicine

## 2016-10-14 VITALS — BP 120/80 | HR 71 | Temp 98.8°F | Resp 16 | Ht 62.0 in | Wt 152.6 lb

## 2016-10-14 DIAGNOSIS — Z09 Encounter for follow-up examination after completed treatment for conditions other than malignant neoplasm: Secondary | ICD-10-CM | POA: Insufficient documentation

## 2016-10-14 DIAGNOSIS — K861 Other chronic pancreatitis: Secondary | ICD-10-CM | POA: Insufficient documentation

## 2016-10-14 DIAGNOSIS — K219 Gastro-esophageal reflux disease without esophagitis: Secondary | ICD-10-CM | POA: Insufficient documentation

## 2016-10-14 DIAGNOSIS — K859 Acute pancreatitis without necrosis or infection, unspecified: Secondary | ICD-10-CM

## 2016-10-14 NOTE — Patient Instructions (Signed)
Pancreatitis crnica (Chronic Pancreatitis) La pancreatitis crnica es una inflamacin prolongada del pncreas que causa la formacin de cicatrices. El pncreas es una glndula que se encuentra detrs del [place]. Produce enzimas que ayudan a Retail buyerdigerir la comida. El pncreas tambin libera las hormonas glucagn e insulina que regulan el nivel de azcar en la [place]. El dao del pncreas puede afectar la digestin, causar dolor en la parte superior del abdomen y la espalda, y producir diabetes. La inflamacin tambin puede irritar otros rganos del abdomen cerca del pncreas. Apenas comienza, la pancreatitis puede ser brusca Huston Foley(aguda). Si la pancreatitis aguda no se detecta a tiempo o no se trata de modo eficaz, o si se presentan varios episodios o crisis prolongadas de este cuadro clnico, la afeccin puede convertirse en pancreatitis crnica. CAUSAS La causa ms frecuente de esta afeccin es el consumo excesivo de alcohol. Algunas otras causas son las siguientes:  Altas concentraciones de triglicridos en la sangre (hipertrigliceridemia).  Clculos u otras afecciones que pueden obstruir el conducto de drenaje del pncreas (conducto pancretico).  Cncer de pncreas.  Fibrosis qustica.  Exceso de calcio en la sangre (hipercalcemia), que puede deberse a la hiperactividad de las glndulas paratiroideas (hiperparatiroidismo).  Ciertos medicamentos.  Lesiones en el pncreas.  Infeccin.  Pancreatitis autoinmunitaria. Esto ocurre cuando el sistema del organismo encargado de Production managerluchar contra las enfermedades (sistema inmunitario) ataca el pncreas.  Genes que se transmiten de padres a hijos (hereditarios). En algunos casos, es posible que la causa no se conozca. FACTORES DE RIESGO Es ms probable que esta afeccin se manifieste en:  Hombres.  Personas de 40a 60aos. SNTOMAS Los sntomas de esta afeccin pueden incluir lo siguiente:  Dolor abdominal. El dolor tambin puede percibirse en la  parte superior de la espalda e intensificarse despus de comer.  Nuseas y vmitos.  Grant RutsFiebre.  Prdida de peso.  Un cambio en el color y la consistencia de la materia fecal, como diarrea. DIAGNSTICO Esta afeccin se diagnostica en funcin de los sntomas, la historia clnica y un examen fsico. Pueden hacerle estudios, por ejemplo:  Anlisis de [place].  Muestras de heces.  Biopsia de pncreas. Se trata de la extirpacin de una pequea cantidad de tejido pancretico para analizarla en un laboratorio.  Estudios de diagnstico por imgenes, por ejemplo: ? Radiografas. ? Tomografa computarizada (TC). ? Resonancia magntica (RM). ? Ecografa. TRATAMIENTO El objetivo del tratamiento es ayudar a Paramedicaliviar los sntomas y Automotive engineerevitar las complicaciones. El tratamiento se centra en lo siguiente:  Poner el pncreas en reposo. Tal vez deba dejar de ingerir alimentos y lquidos durante algunos das mientras est en el hospital, a fin de darle tiempo al pncreas para que se recupere. Durante este tiempo, le administrarn lquidos por va intravenosa para mantenerlo hidratado.  Human resources officerControlar el dolor. Pueden darle medicamentos por va oral o inyectables.  Mejorar la digestin. Le administrarn lo siguiente: ? Medicamentos para ayudar a Engineer, structuralequilibrar las enzimas. ? Suplementos vitamnicos. ? Una dieta especfica para seguir. Si le dan una dieta, puede trabajar con un especialista (nutricionista).  Prevenir la diabetes. Tal vez deba recibir inyecciones de insulina. Pueden hacerle una ciruga para lo siguiente:  Limpiar los conductos del pncreas en caso de obstrucciones, como clculos.  Eliminar el lquido o extirpar el tejido daado del pncreas. INSTRUCCIONES PARA EL CUIDADO EN EL HOGAR  Tome los medicamentos de venta libre y los recetados solamente como se lo haya indicado el mdico. Esto incluye cualquier suplemento vitamnico.  No conduzca ni opere maquinaria pesada mientras toma analgsicos  recetados.  Beba suficiente lquido para Photographermantener la orina clara o de color amarillo plido.  No beba alcohol. Si necesita ayuda para dejar de fumar, consulte al mdico.  No consuma ningn producto que contenga tabaco, lo que incluye cigarrillos, tabaco de Theatre managermascar y Administrator, Civil Servicecigarrillos electrnicos. Si necesita ayuda para dejar de fumar, consulte al mdico.  Siga una dieta como se lo hayan indicado el mdico o el nutricionista, si corresponde. Esto puede incluir lo siguiente: ? Limitar la ingesta de grasas. ? Comer comidas ms pequeas con mayor frecuencia. ? Evite la cafena.  Concurra a todas las visitas de control como se lo haya indicado el mdico. Esto es importante. SOLICITE ATENCIN MDICA SI:  El dolor no mejora con medicamentos.  Tiene fiebre. SOLICITE ATENCIN MDICA DE INMEDIATO SI:  El dolor empeora repentinamente.  Tiene hinchazn abdominal repentina.  Empieza a vomitar con frecuencia o vomita sangre.  Tiene diarrea que no se resuelve.  Maxie Betterbserva sangre en la materia fecal. Esta informacin no tiene Theme park managercomo fin reemplazar el consejo del mdico. Asegrese de hacerle al mdico cualquier pregunta que tenga. Document Released: 06/22/2015 Document Revised: 06/22/2015 Document Reviewed: 02/05/2014 Elsevier Interactive Patient Education  [employer]2018 Elsevier Inc.

## 2016-10-14 NOTE — Progress Notes (Signed)
Stomach pain: constipation for 2 weeks LBM: abnormal, 2 hours ago Denies blood in stool

## 2016-10-14 NOTE — Progress Notes (Signed)
   Subjective:  Patient ID: Evelyn Dillon, female    DOB: 03/07/1961  Age: 56 y.o. MRN: [id number]  CC: Follow-up visit  HPI Evelyn Dillon presents to establish care. She was hospitalized 4 weeks ago with recurrent pancreatitis of unclear etiology; elevated lipase of 4320, triglycerides were normal, RUQ ultrasound was negative and she denies history of alcohol or drug use.  Most recent lipase was 18. She denies abdominal pain at this time but does have intermittent reflux. Denies nausea, vomiting.  Past Medical History:  Diagnosis Date  . Kidney infection   . Pancreatitis   . Syncope    a. 04/2014: tele unrevealing, echo with normal EF & grade 2 DD, normal nuc. Etiology not clear.    Past Surgical History:  Procedure Laterality Date  . TUBAL LIGATION     Patient states that she had birth control surgery, possible tubal ligation    No Known Allergies    No outpatient prescriptions prior to visit.   No facility-administered medications prior to visit.     ROS Review of Systems  Constitutional: Negative for activity change, appetite change and fatigue.  HENT: Negative for congestion, sinus pressure and sore throat.   Eyes: Negative for visual disturbance.  Respiratory: Negative for cough, chest tightness, shortness of breath and wheezing.   Cardiovascular: Negative for chest pain and palpitations.  Gastrointestinal: Negative for abdominal distention, abdominal pain and constipation.  Endocrine: Negative for polydipsia.  Genitourinary: Negative for dysuria and frequency.  Musculoskeletal: Negative for arthralgias and back pain.  Skin: Negative for rash.  Neurological: Negative for tremors, light-headedness and numbness.  Hematological: Does not bruise/bleed easily.  Psychiatric/Behavioral: Negative for agitation and behavioral problems.    Objective:  BP 120/80 (BP Location: Left Arm, Patient Position: Sitting, Cuff Size: Normal)   Pulse 71   Temp 98.8 F (37.1 C)  (Oral)   Resp 16   Ht 5\' 2"  (1.575 m)   Wt 152 lb 9.6 oz (69.2 kg)   LMP 03/15/2011   SpO2 97%   BMI 27.91 kg/m   BP/Weight 10/14/2016 09/20/2016 09/14/2016  Systolic BP 120 131 113  Diastolic BP 80 79 65  Wt. (Lbs) 152.6 150.6 -  BMI 27.91 29.41 -      Physical Exam  Constitutional: She is oriented to person, place, and time. She appears well-developed and well-nourished.  Cardiovascular: Normal rate, normal heart sounds and intact distal pulses.   No murmur heard. Pulmonary/Chest: Effort normal and breath sounds normal. She has no wheezes. She has no rales. She exhibits no tenderness.  Abdominal: Soft. Bowel sounds are normal. She exhibits no distension and no mass. There is no tenderness.  Musculoskeletal: Normal range of motion.  Neurological: She is alert and oriented to person, place, and time.  Skin: Skin is warm and dry.  Psychiatric: She has a normal mood and affect.     Assessment & Plan:   1. Recurrent pancreatitis (HCC) Resolved  2. Gastroesophageal reflux disease without esophagitis She declines treatment with PPI or antihistamine due to concerns for side effects Advised to use Tums as needed Dietary modification.   No orders of the defined types were placed in this encounter.   Follow-up: Return in about 3 weeks (around 11/04/2016) for complete physical.   Jaclyn ShaggyEnobong Amao MD

## 2016-12-12 ENCOUNTER — Encounter: Payer: Self-pay | Admitting: Internal Medicine

## 2016-12-12 ENCOUNTER — Ambulatory Visit: Payer: Self-pay | Attending: Family Medicine | Admitting: Internal Medicine

## 2016-12-12 VITALS — BP 100/64 | HR 66 | Temp 98.8°F | Resp 18 | Ht 62.0 in | Wt 148.0 lb

## 2016-12-12 DIAGNOSIS — Z0000 Encounter for general adult medical examination without abnormal findings: Secondary | ICD-10-CM | POA: Insufficient documentation

## 2016-12-12 DIAGNOSIS — F4321 Adjustment disorder with depressed mood: Secondary | ICD-10-CM | POA: Insufficient documentation

## 2016-12-12 NOTE — Patient Instructions (Signed)
   Trastorno de ansiedad generalizada (Generalized Anxiety Disorder) El trastorno de ansiedad generalizada es un trastorno mental. Interfiere en las funciones vitales, incluyendo las relaciones, el trabajo y la escuela.  Es diferente de la ansiedad normal que todas las personas experimentan en algn momento de su vida en respuesta a sucesos y actividades especficas. En verdad, la ansiedad normal nos ayuda a prepararnos y atravesar estos acontecimientos y actividades de la vida. La ansiedad normal desaparece despus de que el evento o la actividad ha finalizado.  El trastorno de ansiedad generalizada no est necesariamente relacionada con eventos o actividades especficas. Tambin causa un exceso de ansiedad en proporcin a sucesos o actividades especficas. En este trastorno la ansiedad es difcil de controlar. Los sntomas pueden variar de leves a muy graves. Las personas que sufren de trastorno de ansiedad generalizada pueden tener intensas olas de ansiedad con sntomas fsicos (ataques de pnico).  SNTOMAS  La ansiedad y la preocupacin asociada a este trastorno son difciles de controlar. Esta ansiedad y la preocupacin estn relacionados con muchos eventos de la vida y sus actividades y tambin ocurre durante ms das de los que no ocurre, durante 6 meses o ms. Las personas que la sufren pueden tener tres o ms de los siguientes sntomas (uno o ms en los nios):   Agitacin   Fatiga.  Dificultades de concentracin.   Irritabilidad.  Tensin muscular  Dificultad para dormirse o sueo poco satisfactorio. DIAGNSTICO  Se diagnostica a travs de una evaluacin realizada por el mdico. El mdico le har preguntas acerca de su estado de nimo, sntomas fsicos y sucesos de su vida. Le har preguntas sobre su historia clnica, el consumo de alcohol o drogas, incluyendo los medicamentos recetados. Tambin le har un examen fsico e indicar anlisis de sangre. Ciertas enfermedades y el uso de  determinadas sustancias pueden causar sntomas similares a este trastorno. Su mdico lo puede derivar a un especialista en salud mental para una evaluacin ms profunda..  TRATAMIENTO  Las terapias siguientes se utilizan en el tratamiento de este trastorno:   Medicamentos - Se recetan antidepresivos para el control diario a largo plazo. Pueden indicarse tambin medicamentos para combatir la ansiedad en los casos graves, especialmente cuando ocurren ataques de pnico.   Terapia conversada (psicoterapia) Ciertos tipos de psicoterapia pueden ser tiles en el tratamiento del trastorno de ansiedad generalizada, proporcionando apoyo, educacin y orientacin. Una forma de psicoterapia llamada terapia cognitivo-conductual puede ensearle formas saludables de pensar y reaccionar a los eventos y actividades de la vida diaria.  Tcnicasde manejo del estrs- Estas tcnicas incluyen el yoga, la meditacin y el ejercicio y pueden ser muy tiles cuando se practican con regularidad. Un especialista en salud mental puede ayudar a determinar qu tratamiento es mejor para usted. Algunas personas obtienen mejora con una terapia. Sin embargo, otras personas requieren una combinacin de terapias.  Esta informacin no tiene como fin reemplazar el consejo del mdico. Asegrese de hacerle al mdico cualquier pregunta que tenga. Document Released: 06/25/2012 Document Revised: 03/21/2014 Elsevier Interactive Patient Education  2017 Elsevier Inc.  

## 2016-12-12 NOTE — Progress Notes (Signed)
Evelyn Dillon, is a 56 y.o. female  WUJ:[id number]  NFA:[id number]  DOB - 1960/09/28  Chief Complaint  Patient presents with  . Annual Exam  . Gynecologic Exam       Subjective:   Evelyn Dillon is a 56 y.o. female with a history of pancreatitis and depression here today for annual physical and pap smear. She is not on any medication. Her pancreatitis has resolved. She is going through a lot of stress and anxiety right now as a result of one of her sons who was in jail but she received the news that he's being moved to prison. She is distressed because she does not know what's next for him and if he is ok. She does not have family here, they are in [place] and she misses them, also her job is very stressful, she has to wake up each day at 3 am to get to work at 4 am, her sleep cycle is distorted and as a result, she is constantly tired and fatigued. She denies any suicidal ideation or thoughts. Patient has No headache, No chest pain, No abdominal pain - No Nausea, No new weakness tingling or numbness, No Cough - SOB.  Problem  Encounter for Annual Physical Exam  Adjustment Disorder With Depressed Mood    ALLERGIES: No Known Allergies  PAST MEDICAL HISTORY: Past Medical History:  Diagnosis Date  . Kidney infection   . Pancreatitis   . Syncope    a. 04/2014: tele unrevealing, echo with normal EF & grade 2 DD, normal nuc. Etiology not clear.    MEDICATIONS AT HOME: Prior to Admission medications   Not on File    Objective:   Vitals:   12/12/16 1618  BP: 100/64  Pulse: 66  Resp: 18  Temp: 98.8 F (37.1 C)  TempSrc: Oral  SpO2: 99%  Weight: 148 lb (67.1 kg)  Height:  (1.575 m)   Exam General appearance : Awake, alert, not in any distress. Speech Clear. Not toxic looking HEENT: Atraumatic and Normocephalic, pupils equally reactive to light and accomodation Neck: Supple, no JVD. No cervical lymphadenopathy.  Chest: Good air entry bilaterally, no added sounds  CVS:  S1 S2 regular, no murmurs.  Abdomen: Bowel sounds present, Non tender and not distended with no gaurding, rigidity or rebound. Extremities: B/L Lower Ext shows no edema, both legs are warm to touch Neurology: Awake alert, and oriented X 3, CN II-XII intact, Non focal Skin: No Rash  Pelvic Exam: Cervix normal in appearance, external genitalia normal, no adnexal masses or tenderness, no cervical motion tenderness, rectovaginal septum normal, uterus normal size, shape, and consistency and vagina normal without discharge    Data Review Lab Results  Component Value Date   HGBA1C 5.9 (H) 10/29/2009   Assessment & Plan   1. Encounter for annual physical exam  - Cytology - PAP  2. Adjustment disorder with depressed mood  - TSH - VITAMIN D 25 Hydroxy (Vit-D Deficiency, Fractures) - Magnesium - Phosphorus  Patient have been counseled extensively about nutrition and exercise. Other issues discussed during this visit include: low cholesterol diet, weight control and daily exercise, foot care, annual eye examinations at Ophthalmology, importance of adherence with medications and regular follow-up.   Return in about 3 months (around 03/14/2017) for Routine Follow Up.  The patient was given clear instructions to go to ER or return to medical center if symptoms don't improve, worsen or new problems develop. The patient verbalized understanding. The patient was told  to call to get lab results if they haven't heard anything in the next week.   This note has been created with Education officer, environmental. Any transcriptional errors are unintentional.    Jeanann Lewandowsky, MD, MHA, FACP, FAAP, CPE [facility] [place], [place] [phone]   12/12/2016, 4:57 PM

## 2016-12-13 LAB — TSH: TSH: 3.89 u[IU]/mL (ref 0.450–4.500)

## 2016-12-13 LAB — MAGNESIUM: Magnesium: 2.3 mg/dL (ref 1.6–2.3)

## 2016-12-13 LAB — PHOSPHORUS: Phosphorus: 2.8 mg/dL (ref 2.5–4.5)

## 2016-12-13 LAB — VITAMIN D 25 HYDROXY (VIT D DEFICIENCY, FRACTURES): Vit D, 25-Hydroxy: 19.2 ng/mL — ABNORMAL LOW (ref 30.0–100.0)

## 2016-12-19 ENCOUNTER — Other Ambulatory Visit: Payer: Self-pay | Admitting: Internal Medicine

## 2016-12-19 MED ORDER — VITAMIN D (ERGOCALCIFEROL) 1.25 MG (50000 UNIT) PO CAPS: 50000.0000 [IU] | ORAL_CAPSULE | ORAL | 3 refills | Status: DC

## 2016-12-20 ENCOUNTER — Telehealth: Payer: Self-pay | Admitting: *Deleted

## 2016-12-20 LAB — CYTOLOGY - PAP
BACTERIAL VAGINITIS: POSITIVE — AB
CANDIDA VAGINITIS: NEGATIVE
CHLAMYDIA, DNA PROBE: NEGATIVE
Diagnosis: UNDETERMINED — AB
HPV: DETECTED — AB
NEISSERIA GONORRHEA: NEGATIVE
TRICH (WINDOWPATH): NEGATIVE

## 2016-12-20 NOTE — Telephone Encounter (Signed)
Medical Assistant used [employer] to contact patient.  Interpreter Name: Bradley Ferris #: [id number] Patient was informed that her lab results are mostly normal except for low vitamin d levels. Patient was advised that a prescription was sent to the pharmacy and is ready for pick up. Patient had no further questions.

## 2016-12-20 NOTE — Telephone Encounter (Signed)
-----   Message from Quentin Angst, MD sent at 12/19/2016  1:02 PM EDT ----- Please inform patient that her labs are mostly normal except for low Vitamin D. Level. Vit D capsule prescribed to the pharmacy for pick up

## 2016-12-22 ENCOUNTER — Other Ambulatory Visit: Payer: Self-pay | Admitting: Internal Medicine

## 2016-12-22 DIAGNOSIS — R87810 Cervical high risk human papillomavirus (HPV) DNA test positive: Principal | ICD-10-CM

## 2016-12-22 DIAGNOSIS — R87610 Atypical squamous cells of undetermined significance on cytologic smear of cervix (ASC-US): Secondary | ICD-10-CM

## 2016-12-22 MED ORDER — METRONIDAZOLE 500 MG PO TABS
500.0000 mg | ORAL_TABLET | Freq: Two times a day (BID) | ORAL | 0 refills | Status: AC
Start: 2016-12-22 — End: 2016-12-29

## 2016-12-23 ENCOUNTER — Telehealth: Payer: Self-pay | Admitting: *Deleted

## 2016-12-23 NOTE — Telephone Encounter (Signed)
Medical Assistant used [employer] to contact patient.  Interpreter Name: Creta Levin #: [id number] Patient was not available, [employer] left patient a voicemail. Patient is aware of pap showing an abnormality and needing to be referred to gynecology. Patient is also aware of BV being noted and Flagyl being sent to [facility] for pickup. Voicemail states to give a call back to [place] with [facility] at [phone].

## 2016-12-23 NOTE — Telephone Encounter (Signed)
-----   Message from Quentin Angst, MD sent at 12/22/2016 11:25 AM EDT ----- Please inform patient that her Pap smear showed an abnormality, she will be referred to gynecologist for further evaluation and management. There is also presence of bacterial infection, not a sexually transmitted disease, antibiotics sent to the pharmacy for pick up.

## 2016-12-28 ENCOUNTER — Telehealth: Payer: Self-pay | Admitting: Obstetrics and Gynecology

## 2016-12-28 NOTE — Telephone Encounter (Signed)
Called and left a message for patient to call back to schedule a new patient doctor referral. °

## 2016-12-30 NOTE — Telephone Encounter (Signed)
Called and left a message for patient to call back to schedule a new patient doctor referral. °

## 2017-01-02 NOTE — Telephone Encounter (Signed)
Called and left a message for patient to call back to schedule a new patient doctor referral. °

## 2017-01-03 NOTE — Telephone Encounter (Signed)
Routing referral back to referring office. Patient has not returned calls to schedule an appointment. °

## 2017-03-15 ENCOUNTER — Ambulatory Visit: Payer: Self-pay | Attending: Family Medicine | Admitting: Family Medicine

## 2017-03-15 ENCOUNTER — Encounter: Payer: Self-pay | Admitting: Family Medicine

## 2017-03-15 VITALS — BP 112/68 | HR 69 | Temp 97.6°F | Ht 62.0 in | Wt 151.8 lb

## 2017-03-15 DIAGNOSIS — R519 Headache, unspecified: Secondary | ICD-10-CM

## 2017-03-15 DIAGNOSIS — K861 Other chronic pancreatitis: Secondary | ICD-10-CM | POA: Insufficient documentation

## 2017-03-15 DIAGNOSIS — R51 Headache: Secondary | ICD-10-CM | POA: Insufficient documentation

## 2017-03-15 DIAGNOSIS — Z9851 Tubal ligation status: Secondary | ICD-10-CM | POA: Insufficient documentation

## 2017-03-15 DIAGNOSIS — E559 Vitamin D deficiency, unspecified: Secondary | ICD-10-CM | POA: Insufficient documentation

## 2017-03-15 DIAGNOSIS — R509 Fever, unspecified: Secondary | ICD-10-CM | POA: Insufficient documentation

## 2017-03-15 DIAGNOSIS — Z79899 Other long term (current) drug therapy: Secondary | ICD-10-CM | POA: Insufficient documentation

## 2017-03-15 MED ORDER — VITAMIN D (ERGOCALCIFEROL) 1.25 MG (50000 UNIT) PO CAPS: 50000.0000 [IU] | ORAL_CAPSULE | ORAL | 3 refills | Status: DC

## 2017-03-15 MED ORDER — CETIRIZINE HCL 10 MG PO TABS: 10.0000 mg | ORAL_TABLET | Freq: Every day | ORAL | 1 refills | Status: DC

## 2017-03-15 NOTE — Patient Instructions (Signed)
Dolor de cabeza sinusal (Sinus Headache) El dolor de cabeza sinusal ocurre cuando los senos paranasales se taponan o se inflaman. Puede sentir dolor u opresin en el rostro, la frente, los odos o las piezas dentales superiores. Los dolores de cabeza sinusales pueden ser leves o intensos. CUIDADOS EN EL HOGAR  Tome los medicamentos solamente como se lo haya indicado el mdico.  Si le indicaron que tome antibiticos, debe terminarlos, incluso si comienza a sentirse mejor.  Aplquese un aerosol nasal si tiene la nariz taponada (congestin).  Si se lo indican, pngase un pao tibio y [employer] Squ[employer]medo sobre el rostro para ayudar a Engineer, materialsaliviar el dolor. SOLICITE AYUDA SI:  Tiene dolores de cabeza ms de una vez por semana.  La luz o el ruido Phelps Dodgele molestan.  Tiene fiebre.  Tiene malestar estomacal (nuseas) o vomita.  Los dolores de cabeza no mejoran con Scientist, research (medical)el tratamiento. SOLICITE AYUDA DE INMEDIATO SI:  Tiene dificultad para ver.  Repentinamente, siente un dolor muy intenso en el rostro o la cabeza.  Empieza a sacudirse o a temblar (convulsiones).  Se siente confundido.  Presenta rigidez en el cuello. Esta informacin no tiene Theme park managercomo fin reemplazar el consejo del mdico. Asegrese de hacerle al mdico cualquier pregunta que tenga. Document Released: 08/02/2010 Document Revised: 07/15/2014 Document Reviewed: 02/24/2014 Elsevier Interactive Patient Education  [employer]2018 Elsevier Inc.

## 2017-03-15 NOTE — Progress Notes (Signed)
Subjective:  Patient ID: Evelyn Dillon, female    DOB: 26-Feb-1961  Age: 57 y.o. MRN: [id number]  CC: Pancreatitis   HPI Evelyn Dillon is a 57 year old female with a history of recurrent Pancreatitis who presents today for a follow-up visit.  She denies recent pancreatitis flare. She complains of a 1 day history of headaches, sinus pressure in the frontal and maxillary region with associated postnasal drips. This was also associated with body aches and subjective fevers which have improved and she has been using OTC Tylenol for her headaches.  Her last set of blood work revealed vitamin D deficiency with a vitamin D level of 19.2 for which a prescription for vitamin D replacement was sent to the pharmacy which she never picked up.  Past Medical History:  Diagnosis Date  . Kidney infection   . Pancreatitis   . Syncope    a. 04/2014: tele unrevealing, echo with normal EF & grade 2 DD, normal nuc. Etiology not clear.    Past Surgical History:  Procedure Laterality Date  . TUBAL LIGATION     Patient states that she had birth control surgery, possible tubal ligation    No Known Allergies   Outpatient Medications Prior to Visit  Medication Sig Dispense Refill  . Vitamin D, Ergocalciferol, (DRISDOL) 50000 units CAPS capsule Take 1 capsule (50,000 Units total) by mouth every 7 (seven) days. (Patient not taking: Reported on 03/15/2017) 4 capsule 3   No facility-administered medications prior to visit.     ROS Review of Systems  Constitutional: Negative for activity change, appetite change and fatigue.  HENT: Positive for postnasal drip, sinus pressure and sinus pain. Negative for congestion and sore throat.   Eyes: Negative for visual disturbance.  Respiratory: Negative for cough, chest tightness, shortness of breath and wheezing.   Cardiovascular: Negative for chest pain and palpitations.  Gastrointestinal: Negative for abdominal distention, abdominal pain and constipation.    Endocrine: Negative for polydipsia.  Genitourinary: Negative for dysuria and frequency.  Musculoskeletal: Negative for arthralgias and back pain.  Skin: Negative for rash.  Neurological: Positive for headaches. Negative for tremors, light-headedness and numbness.  Hematological: Does not bruise/bleed easily.  Psychiatric/Behavioral: Negative for agitation and behavioral problems.    Objective:  BP 112/68   Pulse 69   Temp 97.6 F (36.4 C) (Oral)   Ht 5\' 2"  (1.575 m)   Wt 151 lb 12.8 oz (68.9 kg)   LMP 03/15/2011   SpO2 99%   BMI 27.76 kg/m   BP/Weight 03/15/2017 12/12/2016 10/14/2016  Systolic BP 112 100 120  Diastolic BP 68 64 80  Wt. (Lbs) 151.8 148 152.6  BMI 27.76 27.07 27.91      Physical Exam  Constitutional: She is oriented to person, place, and time. She appears well-developed and well-nourished.  Cardiovascular: Normal rate, normal heart sounds and intact distal pulses.  No murmur heard. Pulmonary/Chest: Effort normal and breath sounds normal. She has no wheezes. She has no rales. She exhibits no tenderness.  Abdominal: Soft. Bowel sounds are normal. She exhibits no distension and no mass. There is no tenderness.  Musculoskeletal: Normal range of motion.  Neurological: She is alert and oriented to person, place, and time.  Skin: Skin is warm and dry.  Psychiatric: She has a normal mood and affect.     Assessment & Plan:   1. Vitamin D deficiency She never picked up her vitamin D prescription which was sent to the pharmacy Prescription has been refilled - Vitamin  D, Ergocalciferol, (DRISDOL) 50000 units CAPS capsule; Take 1 capsule (50,000 Units total) by mouth every 7 (seven) days.  Dispense: 4 capsule; Refill: 3  2. Sinus headache Advised to take Tylenol as well - cetirizine (ZYRTEC) 10 MG tablet; Take 1 tablet (10 mg total) by mouth daily.  Dispense: 30 tablet; Refill: 1   Meds ordered this encounter  Medications  . cetirizine (ZYRTEC) 10 MG tablet     Sig: Take 1 tablet (10 mg total) by mouth daily.    Dispense:  30 tablet    Refill:  1  . Vitamin D, Ergocalciferol, (DRISDOL) 50000 units CAPS capsule    Sig: Take 1 capsule (50,000 Units total) by mouth every 7 (seven) days.    Dispense:  4 capsule    Refill:  3    Follow-up: Return in about 6 months (around 09/12/2017) for coordination of care.   Jaclyn Shaggy MD

## 2017-05-19 IMAGING — US US ABDOMEN LIMITED
1 series · 14 of 25 positions shown · non-contrast
Comparison: CT of the abdomen and pelvis performed earlier today at
[DATE] a.m.

CLINICAL DATA: Acute onset of generalized abdominal pain. Initial
encounter.

EXAM:
US ABDOMEN LIMITED - RIGHT UPPER QUADRANT

[Series 1: us abdomen limited · 0.23mm/px · 14 of 54 slices shown]
[im 1/54]
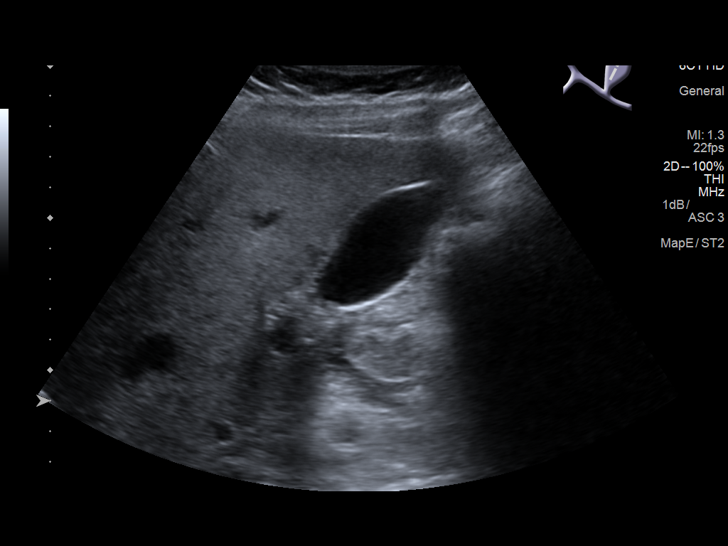
[im 5/54]
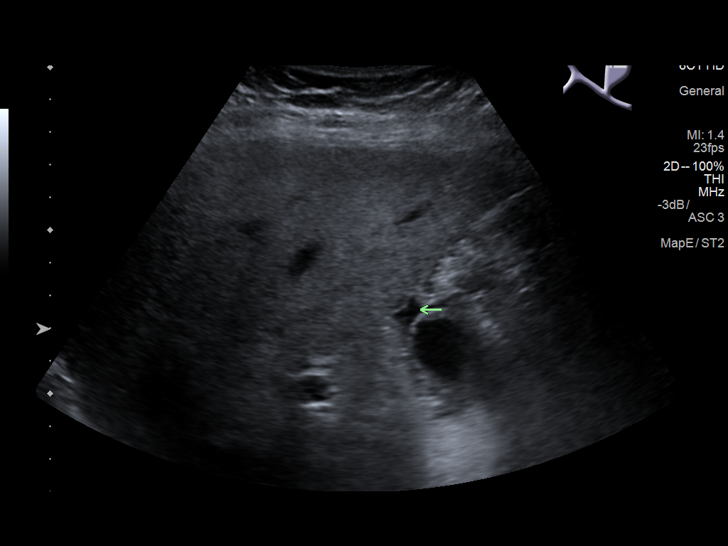
[im 9/54]
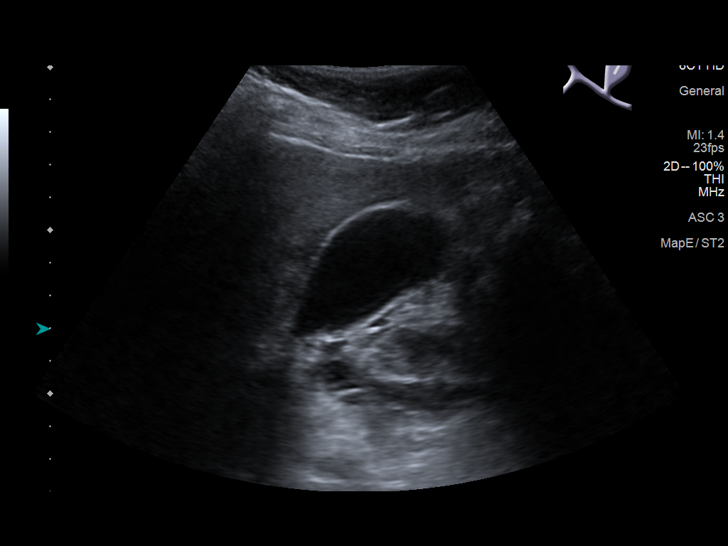
[im 14/54]
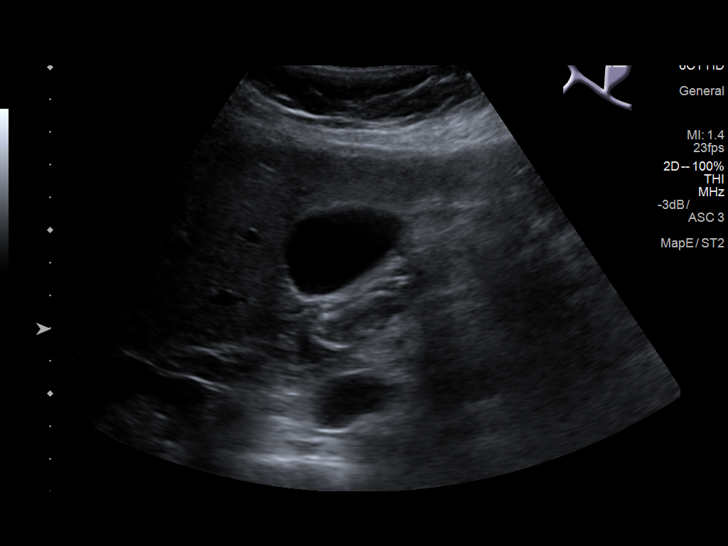
[im 18/54]
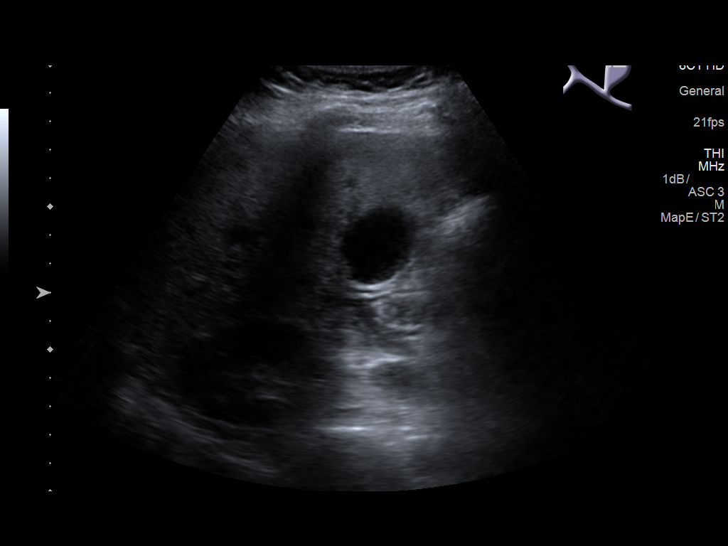
[im 20/54]
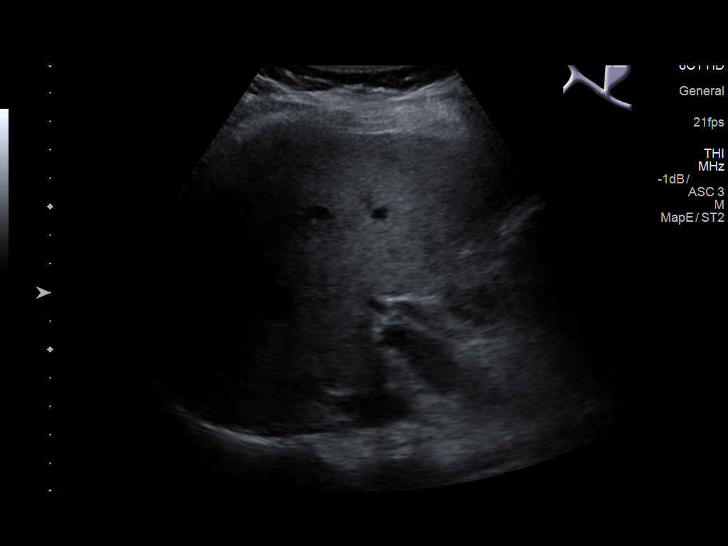
[im 25/54]
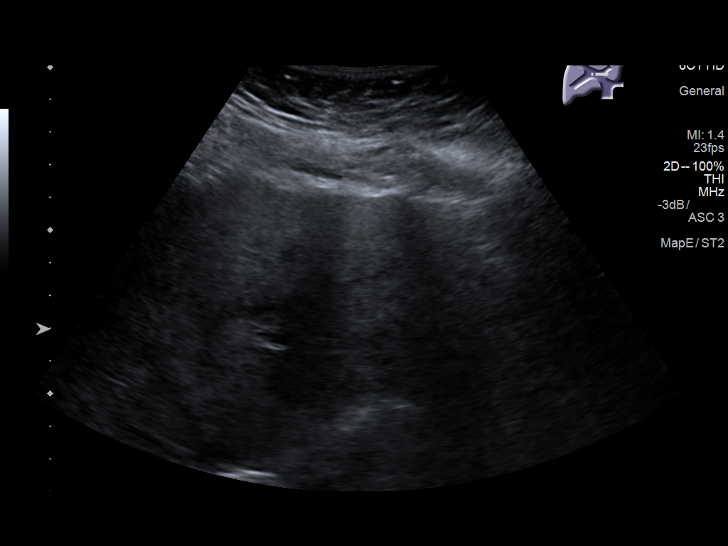
[im 29/54]
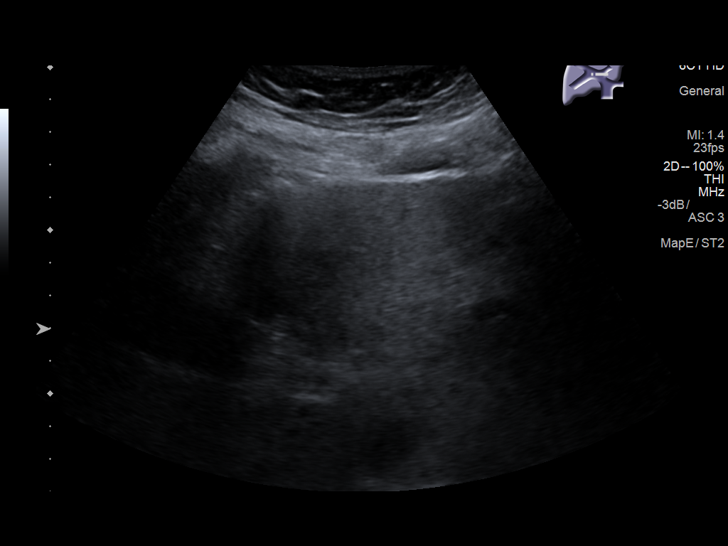
[im 34/54]
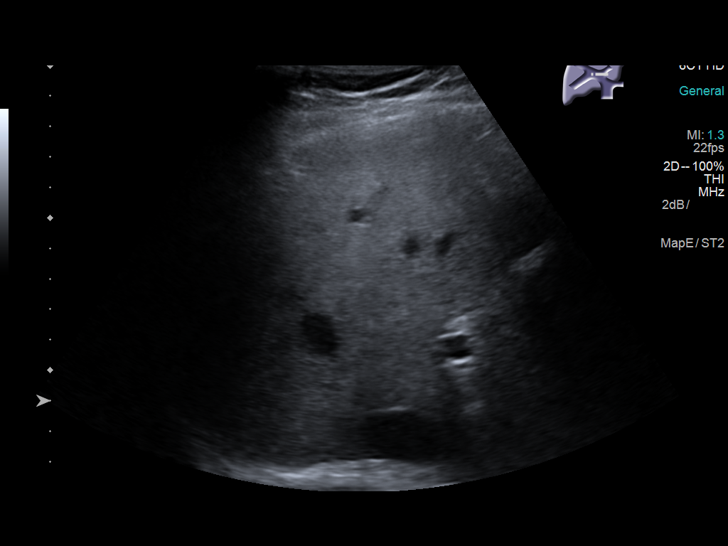
[im 36/54]
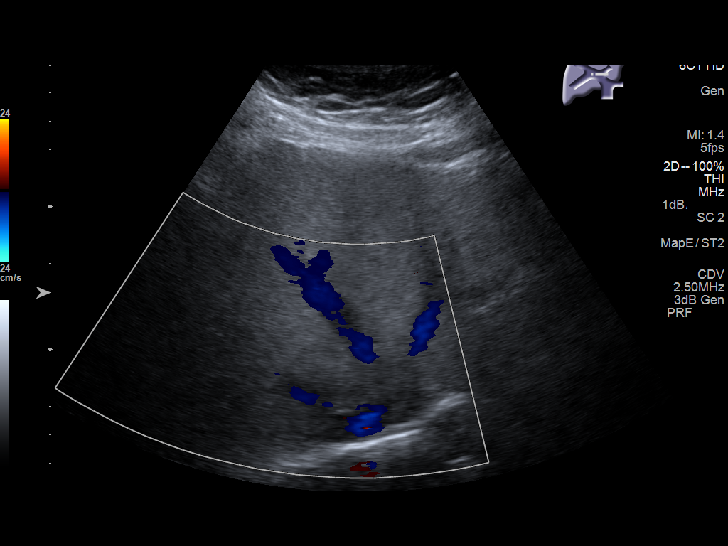
[im 40/54]
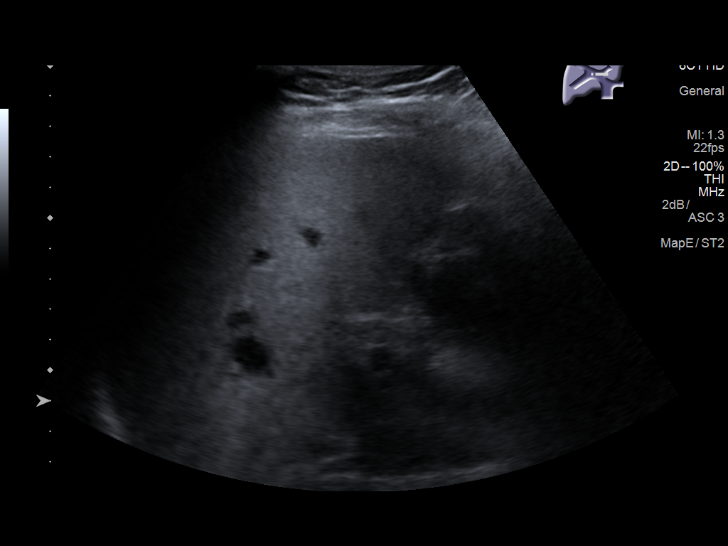
[im 45/54]
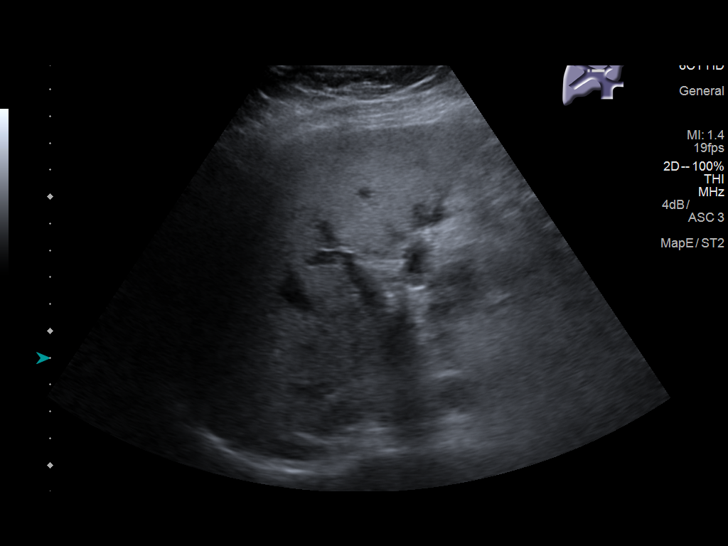
[im 49/54]
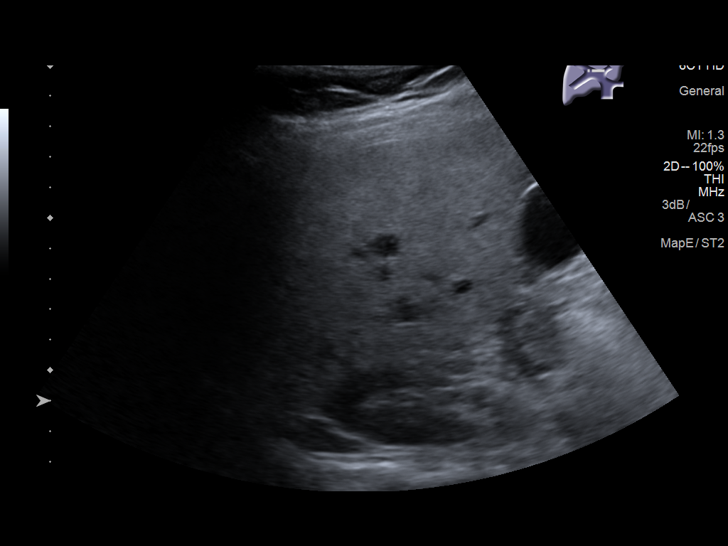
[im 54/54]
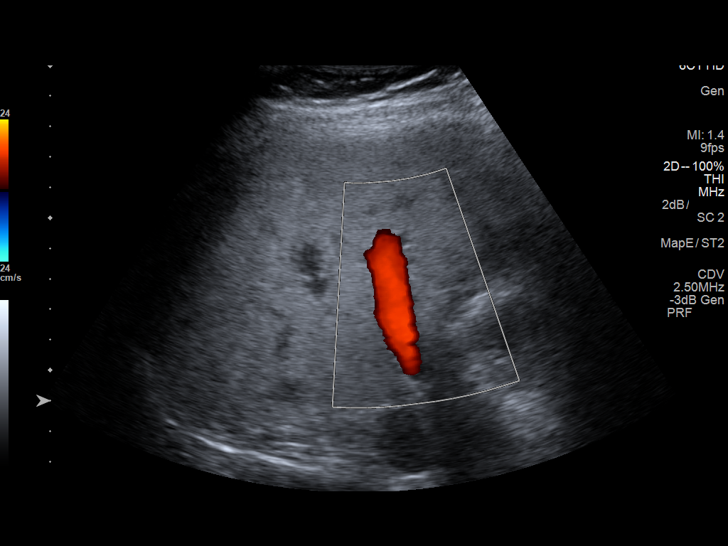

[14 of 25 positions shown; findings below may reference images not displayed]

FINDINGS: Gallbladder:

No gallstones or wall thickening visualized. No sonographic Murphy
sign noted by sonographer.

Common bile duct:

Diameter: 0.5 cm, within normal limits in caliber.

Liver:

No focal lesion identified. Diffusely increased parenchymal
echogenicity, compatible with fatty infiltration.

Trace fluid is incidentally noted adjacent to the liver.
IMPRESSION: 1. No acute abnormality seen at the right upper quadrant.
2. Diffuse fatty infiltration within the liver.
3. Trace ascites adjacent to the liver.

## 2017-10-11 ENCOUNTER — Ambulatory Visit: Payer: Self-pay | Admitting: Family Medicine

## 2017-12-19 IMAGING — US US ABDOMEN LIMITED
1 series · 14 of 25 positions shown · non-contrast
Comparison: CT, ultrasound, and MRI 02/11/2016

CLINICAL DATA: Pancreatitis.  Abdominal pain.

EXAM:
ULTRASOUND ABDOMEN LIMITED RIGHT UPPER QUADRANT

[Series 1: us abdomen limited · 0.22mm/px · 14 of 32 slices shown]
[im 1/32]
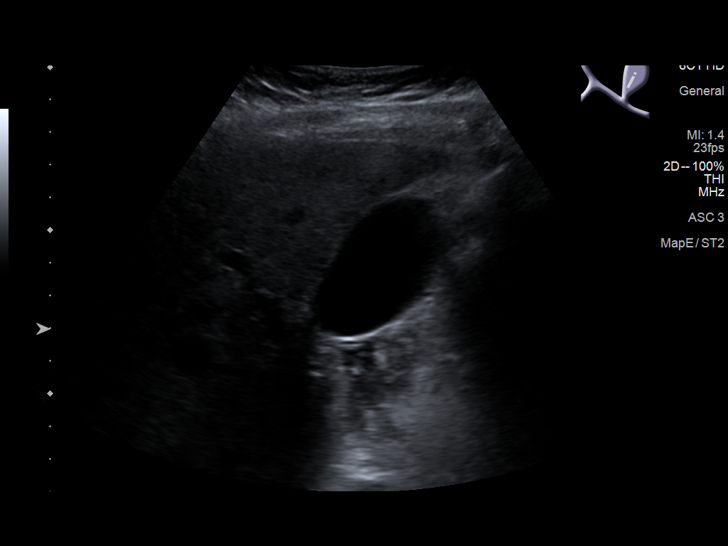
[im 3/32]
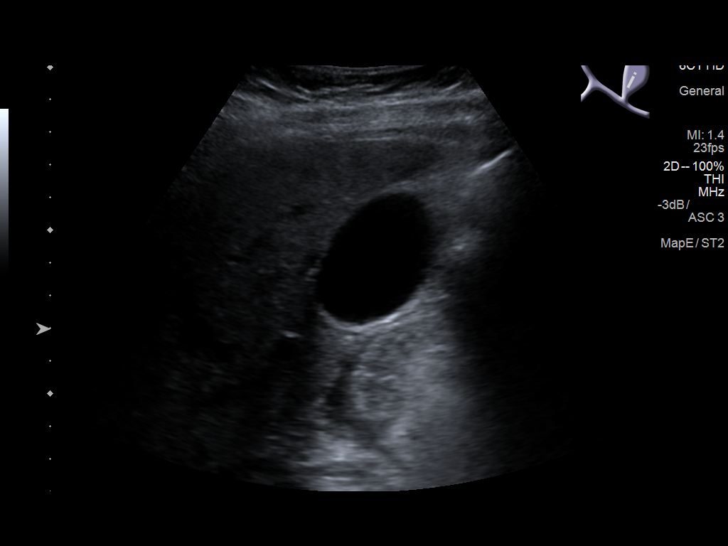
[im 6/32]
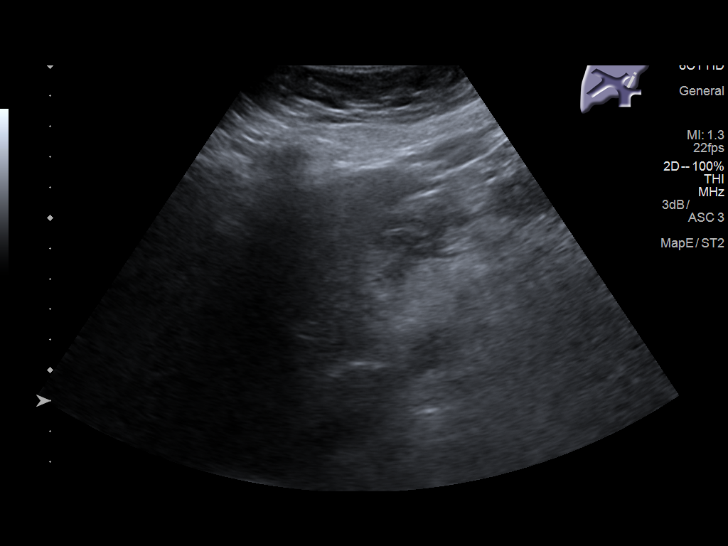
[im 8/32]
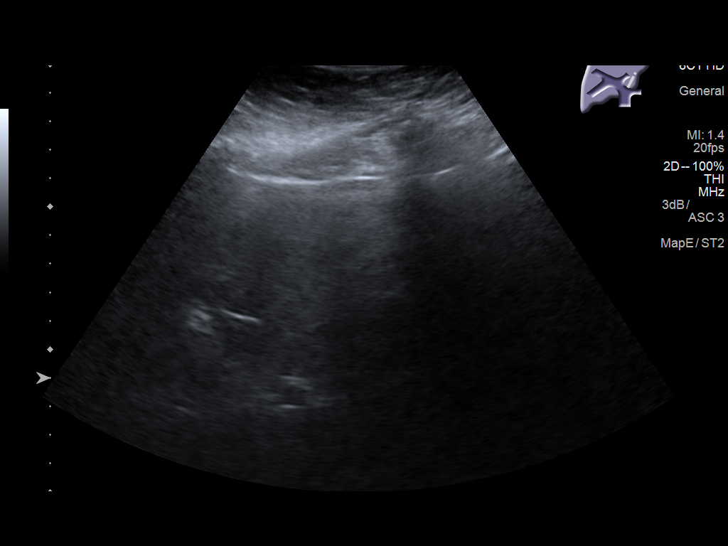
[im 11/32]
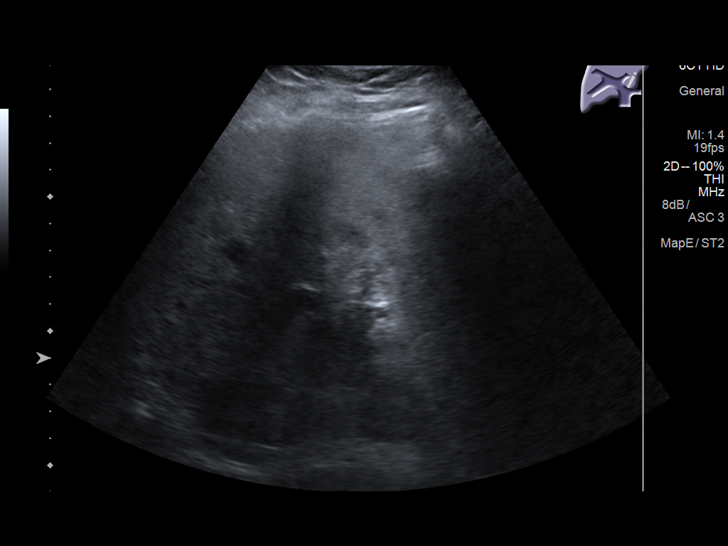
[im 12/32]
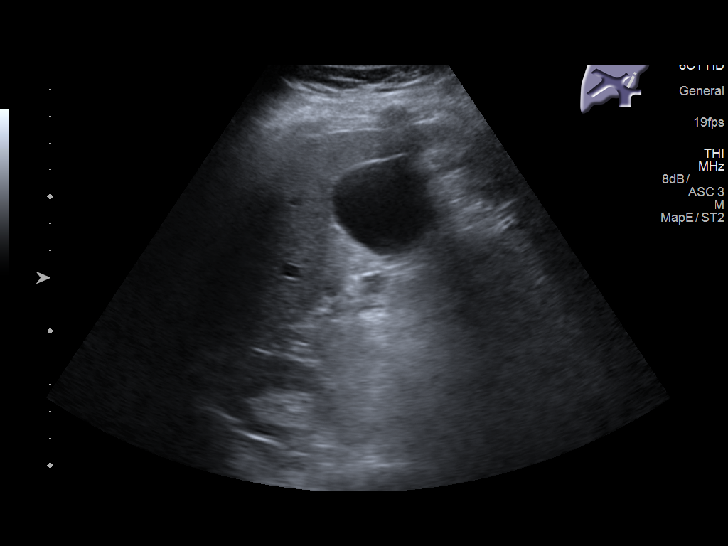
[im 15/32]
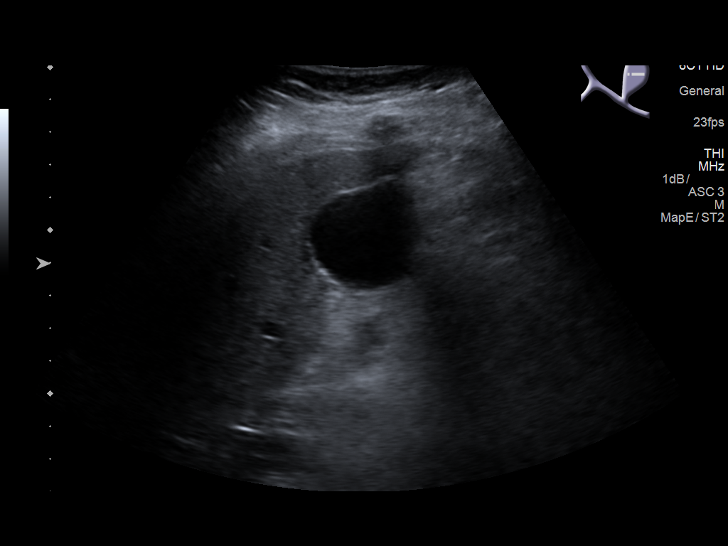
[im 17/32]
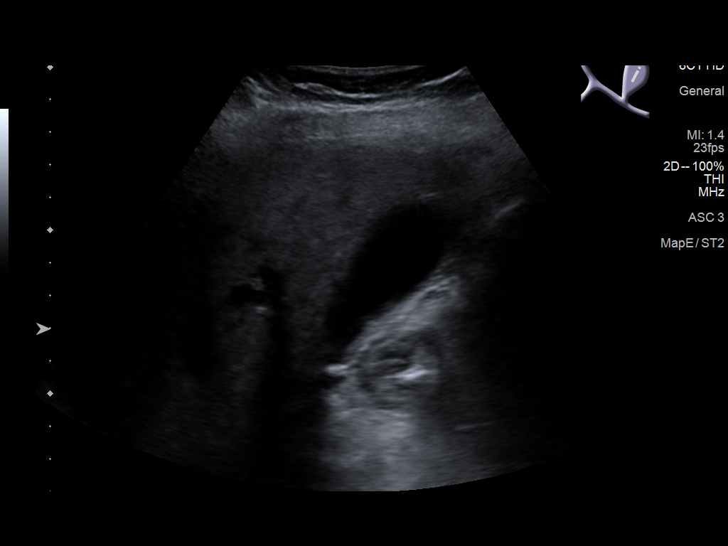
[im 20/32]
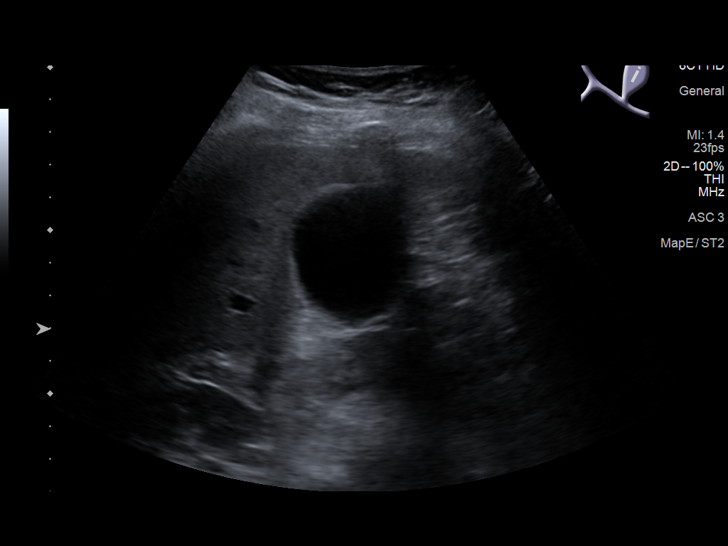
[im 21/32]
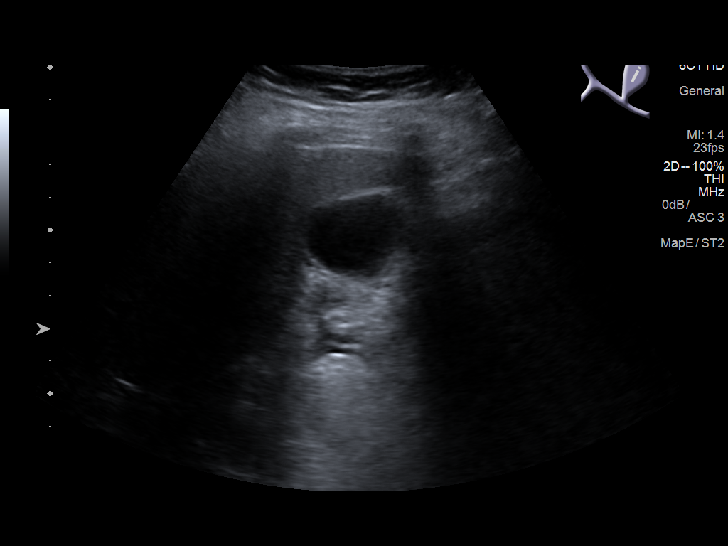
[im 24/32]
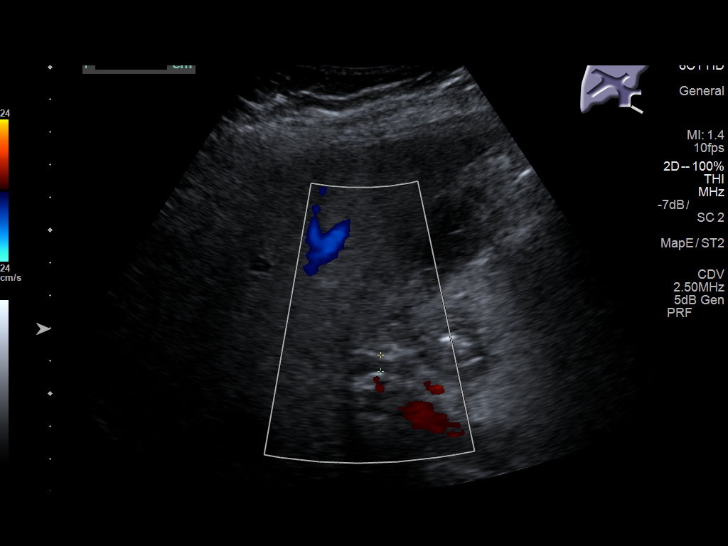
[im 26/32]
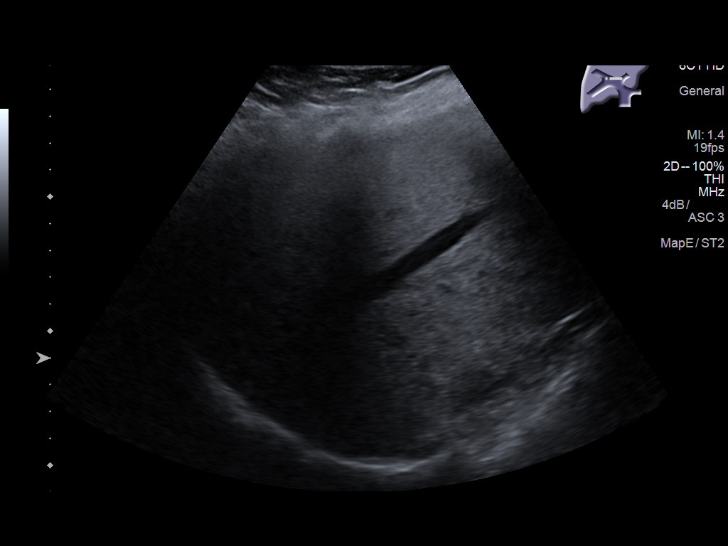
[im 29/32]
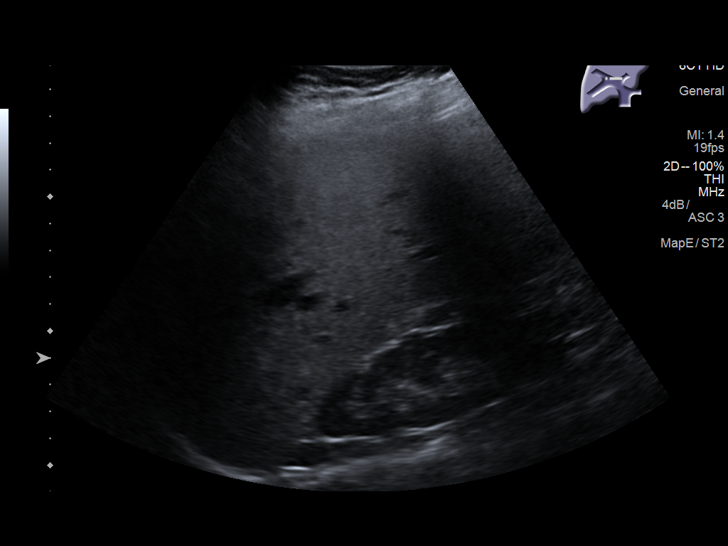
[im 32/32]
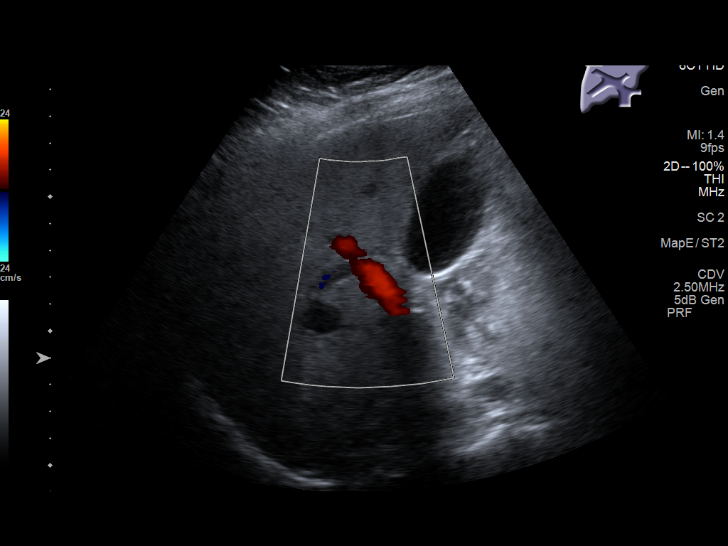

[14 of 25 positions shown; findings below may reference images not displayed]

FINDINGS: Gallbladder:

Physiologically distended. No gallstones or wall thickening
visualized. No pericholecystic fluid. No sonographic Murphy sign
noted by sonographer.

Common bile duct:

Diameter: 5 mm.

Liver:

No focal lesion identified. Diffusely increased limits in
parenchymal echogenicity. Normal directional flow in the main portal
vein.
IMPRESSION: 1. Normal sonographic appearance of gallbladder and biliary tree.
2. Hepatic steatosis.

## 2018-03-30 ENCOUNTER — Encounter (HOSPITAL_COMMUNITY): Payer: Self-pay | Admitting: Emergency Medicine

## 2018-03-30 ENCOUNTER — Inpatient Hospital Stay (HOSPITAL_COMMUNITY)
Admission: EM | Admit: 2018-03-30 | Discharge: 2018-04-02 | DRG: 440 | Disposition: A | Discharge status: Home / Self Care | Payer: Self-pay | Attending: Internal Medicine | Admitting: Internal Medicine

## 2018-03-30 ENCOUNTER — Other Ambulatory Visit: Payer: Self-pay

## 2018-03-30 DIAGNOSIS — Z6830 Body mass index (BMI) 30.0-30.9, adult: Secondary | ICD-10-CM

## 2018-03-30 DIAGNOSIS — R1010 Upper abdominal pain, unspecified: Secondary | ICD-10-CM

## 2018-03-30 DIAGNOSIS — R51 Headache: Secondary | ICD-10-CM | POA: Diagnosis not present

## 2018-03-30 DIAGNOSIS — K861 Other chronic pancreatitis: Secondary | ICD-10-CM | POA: Diagnosis present

## 2018-03-30 DIAGNOSIS — E669 Obesity, unspecified: Secondary | ICD-10-CM | POA: Diagnosis present

## 2018-03-30 DIAGNOSIS — K859 Acute pancreatitis without necrosis or infection, unspecified: Secondary | ICD-10-CM

## 2018-03-30 DIAGNOSIS — K8500 Idiopathic acute pancreatitis without necrosis or infection: Principal | ICD-10-CM | POA: Diagnosis present

## 2018-03-30 DIAGNOSIS — Z8249 Family history of ischemic heart disease and other diseases of the circulatory system: Secondary | ICD-10-CM

## 2018-03-30 DIAGNOSIS — Z833 Family history of diabetes mellitus: Secondary | ICD-10-CM

## 2018-03-30 DIAGNOSIS — D649 Anemia, unspecified: Secondary | ICD-10-CM | POA: Diagnosis present

## 2018-03-30 DIAGNOSIS — Z9851 Tubal ligation status: Secondary | ICD-10-CM

## 2018-03-30 DIAGNOSIS — F4321 Adjustment disorder with depressed mood: Secondary | ICD-10-CM | POA: Diagnosis present

## 2018-03-30 DIAGNOSIS — R739 Hyperglycemia, unspecified: Secondary | ICD-10-CM | POA: Diagnosis present

## 2018-03-30 LAB — COMPREHENSIVE METABOLIC PANEL
ALK PHOS: 70 U/L (ref 38–126)
ALT: 26 U/L (ref 0–44)
ANION GAP: 13 (ref 5–15)
AST: 36 U/L (ref 15–41)
Albumin: 4 g/dL (ref 3.5–5.0)
BILIRUBIN TOTAL: 0.4 mg/dL (ref 0.3–1.2)
BUN: 15 mg/dL (ref 6–20)
CALCIUM: 9.2 mg/dL (ref 8.9–10.3)
CO2: 20 mmol/L — AB (ref 22–32)
Chloride: 107 mmol/L (ref 98–111)
Creatinine, Ser: 0.73 mg/dL (ref 0.44–1.00)
Glucose, Bld: 118 mg/dL — ABNORMAL HIGH (ref 70–99)
Potassium: 3.9 mmol/L (ref 3.5–5.1)
SODIUM: 140 mmol/L (ref 135–145)
TOTAL PROTEIN: 6.9 g/dL (ref 6.5–8.1)

## 2018-03-30 LAB — CBC
HCT: 38.8 % (ref 36.0–46.0)
Hemoglobin: 12.5 g/dL (ref 12.0–15.0)
MCH: 28.8 pg (ref 26.0–34.0)
MCHC: 32.2 g/dL (ref 30.0–36.0)
MCV: 89.4 fL (ref 80.0–100.0)
NRBC: 0 % (ref 0.0–0.2)
PLATELETS: 229 10*3/uL (ref 150–400)
RBC: 4.34 MIL/uL (ref 3.87–5.11)
RDW: 12.6 % (ref 11.5–15.5)
WBC: 7.9 10*3/uL (ref 4.0–10.5)

## 2018-03-30 LAB — URINALYSIS, ROUTINE W REFLEX MICROSCOPIC
Bilirubin Urine: NEGATIVE
Glucose, UA: NEGATIVE mg/dL
Hgb urine dipstick: NEGATIVE
KETONES UR: NEGATIVE mg/dL
LEUKOCYTES UA: NEGATIVE
Nitrite: NEGATIVE
PROTEIN: NEGATIVE mg/dL
Specific Gravity, Urine: 1.01 (ref 1.005–1.030)
pH: 7 (ref 5.0–8.0)

## 2018-03-30 LAB — LIPASE, BLOOD: Lipase: 2991 U/L — ABNORMAL HIGH (ref 11–51)

## 2018-03-30 LAB — I-STAT BETA HCG BLOOD, ED (MC, WL, AP ONLY)

## 2018-03-30 MED ORDER — SODIUM CHLORIDE 0.9% FLUSH: 3.0000 mL | Freq: Once | INTRAVENOUS | Status: DC

## 2018-03-30 MED ORDER — ONDANSETRON HCL 4 MG PO TABS: 4.0000 mg | ORAL_TABLET | Freq: Four times a day (QID) | ORAL | Status: DC | PRN

## 2018-03-30 MED ORDER — SODIUM CHLORIDE 0.9 % IV SOLN
INTRAVENOUS | Status: DC
  Administered 2018-03-31 – 2018-04-02 (×5): via INTRAVENOUS

## 2018-03-30 MED ORDER — MORPHINE SULFATE (PF) 4 MG/ML IV SOLN
4.0000 mg | Freq: Once | INTRAVENOUS | Status: AC
  Administered 2018-03-30: 4 mg via INTRAVENOUS
  Filled 2018-03-30: qty 1

## 2018-03-30 MED ORDER — SODIUM CHLORIDE 0.9 % IV BOLUS
1000.0000 mL | Freq: Once | INTRAVENOUS | Status: AC
  Administered 2018-03-30: 1000 mL via INTRAVENOUS

## 2018-03-30 MED ORDER — FAMOTIDINE IN NACL 20-0.9 MG/50ML-% IV SOLN
20.0000 mg | Freq: Once | INTRAVENOUS | Status: AC
  Administered 2018-03-30: 20 mg via INTRAVENOUS
  Filled 2018-03-30: qty 50

## 2018-03-30 MED ORDER — ONDANSETRON HCL 4 MG/2ML IJ SOLN
4.0000 mg | Freq: Once | INTRAMUSCULAR | Status: AC
  Administered 2018-03-30: 4 mg via INTRAVENOUS
  Filled 2018-03-30: qty 2

## 2018-03-30 MED ORDER — ONDANSETRON HCL 4 MG/2ML IJ SOLN: 4.0000 mg | Freq: Four times a day (QID) | INTRAMUSCULAR | Status: DC | PRN

## 2018-03-30 MED ORDER — MORPHINE SULFATE (PF) 2 MG/ML IV SOLN
2.0000 mg | INTRAVENOUS | Status: DC | PRN
  Administered 2018-03-31 – 2018-04-02 (×4): 2 mg via INTRAVENOUS
  Filled 2018-03-30 (×4): qty 1

## 2018-03-30 NOTE — ED Notes (Signed)
Called 3W to give report.  Receiving RN unable to take report at this time. 

## 2018-03-30 NOTE — ED Provider Notes (Signed)
[facility] EMERGENCY DEPARTMENT Provider Note   CSN: [id number] Arrival date & time: 03/30/18  1843     History   Chief Complaint Chief Complaint  Patient presents with  . Abdominal Pain    HPI Evelyn Dillon is a 58 y.o. female.  Patient is a 58 year old female who presents with upper abdominal pain.  She has a history of recurrent pancreatitis which per chart review is idiopathic.  She has no history of gallbladder disease.  No abdominal surgeries other than a BTL.  She presents with a 1 day history of worsening pain across her upper abdomen.  It is consistent with her prior episodes of pancreatitis.  She has had some associated nausea and vomiting.  No known fevers.  No urinary symptoms.  No change in bowels.     Past Medical History:  Diagnosis Date  . Kidney infection   . Pancreatitis   . Syncope    a. 04/2014: tele unrevealing, echo with normal EF & grade 2 DD, normal nuc. Etiology not clear.    Patient Active Problem List   Diagnosis Date Noted  . Acute pancreatitis 03/30/2018  . Vitamin D deficiency 03/15/2017  . Encounter for annual physical exam 12/12/2016  . Adjustment disorder with depressed mood 12/12/2016  . Non-English speaking patient 09/14/2016  . Pancreatitis 09/12/2016  . Recurrent pancreatitis   . Major depressive disorder, recurrent episode (HCC) 01/13/2015  . Epigastric pain   . Upper abdominal pain   . Hypokalemia   . Facial trauma 05/07/2014  . Left arm pain 05/07/2014  . Syncope 05/06/2014  . ANEMIA, IRON DEFICIENCY, UNSPEC. 05/11/2006    Past Surgical History:  Procedure Laterality Date  . TUBAL LIGATION     Patient states that she had birth control surgery, possible tubal ligation     OB History   No obstetric history on file.      Home Medications    Prior to Admission medications   Medication Sig Start Date End Date Taking? Authorizing Provider  cetirizine (ZYRTEC) 10 MG tablet Take 1 tablet (10 mg total)  by mouth daily. Patient not taking: Reported on 03/30/2018 03/15/17   Hoy Register, MD  Vitamin D, Ergocalciferol, (DRISDOL) 50000 units CAPS capsule Take 1 capsule (50,000 Units total) by mouth every 7 (seven) days. Patient not taking: Reported on 03/30/2018 03/15/17   Hoy Register, MD    Family History Family History  Problem Relation Age of Onset  . Diabetes Mellitus I Sister   . Heart disease Sister        Died at 24    Social History Social History   Tobacco Use  . Smoking status: Never Smoker  . Smokeless tobacco: Never Used  Substance Use Topics  . Alcohol use: No    Alcohol/week: 0.0 standard drinks  . Drug use: No     Allergies   Patient has no known allergies.   Review of Systems Review of Systems  Constitutional: Negative for chills, diaphoresis, fatigue and fever.  HENT: Negative for congestion, rhinorrhea and sneezing.   Eyes: Negative.   Respiratory: Negative for cough, chest tightness and shortness of breath.   Cardiovascular: Negative for chest pain and leg swelling.  Gastrointestinal: Positive for abdominal pain, nausea and vomiting. Negative for blood in stool and diarrhea.  Genitourinary: Negative for difficulty urinating, flank pain, frequency and hematuria.  Musculoskeletal: Negative for arthralgias and back pain.  Skin: Negative for rash.  Neurological: Negative for dizziness, speech difficulty, weakness, numbness  and headaches.     Physical Exam Updated Vital Signs BP (!) 145/77 (BP Location: Right Arm)   Pulse (!) 58   Temp 97.8 F (36.6 C)   Resp 18   LMP 03/15/2011   SpO2 100%   Physical Exam Constitutional:      Appearance: She is well-developed.  HENT:     Head: Normocephalic and atraumatic.  Eyes:     Pupils: Pupils are equal, round, and reactive to light.  Neck:     Musculoskeletal: Normal range of motion and neck supple.  Cardiovascular:     Rate and Rhythm: Normal rate and regular rhythm.     Heart sounds: Normal heart  sounds.  Pulmonary:     Effort: Pulmonary effort is normal. No respiratory distress.     Breath sounds: Normal breath sounds. No wheezing or rales.  Chest:     Chest wall: No tenderness.  Abdominal:     General: Bowel sounds are normal.     Palpations: Abdomen is soft.     Tenderness: There is abdominal tenderness in the right upper quadrant, epigastric area and left upper quadrant. There is no guarding or rebound.  Musculoskeletal: Normal range of motion.  Lymphadenopathy:     Cervical: No cervical adenopathy.  Skin:    General: Skin is warm and dry.     Findings: No rash.  Neurological:     Mental Status: She is alert and oriented to person, place, and time.      ED Treatments / Results  Labs (all labs ordered are listed, but only abnormal results are displayed) Labs Reviewed  LIPASE, BLOOD - Abnormal; Notable for the following components:      Result Value   Lipase 2,991 (*)    All other components within normal limits  COMPREHENSIVE METABOLIC PANEL - Abnormal; Notable for the following components:   CO2 20 (*)    Glucose, Bld 118 (*)    All other components within normal limits  CBC  URINALYSIS, ROUTINE W REFLEX MICROSCOPIC  I-STAT BETA HCG BLOOD, ED ([facility], [facility], [facility] ONLY)    EKG None  Radiology No results found.  Procedures Procedures (including critical care time)  Medications Ordered in ED Medications  sodium chloride flush (NS) 0.9 % injection 3 mL (has no administration in time range)  morphine 4 MG/ML injection 4 mg (has no administration in time range)  ondansetron (ZOFRAN) injection 4 mg (has no administration in time range)  sodium chloride 0.9 % bolus 1,000 mL (has no administration in time range)  famotidine (PEPCID) IVPB 20 mg premix (has no administration in time range)  sodium chloride 0.9 % bolus 1,000 mL (has no administration in time range)     Initial Impression / Assessment and Plan / ED Course  I have reviewed the triage vital signs and  the nursing notes.  Pertinent labs & imaging results that were available during my care of the patient were reviewed by me and considered in my medical decision making (see chart for details).     Patient presents with upper abdominal pain.  She has a history of recurrent idiopathic pancreatitis.  She has had multiple imaging studies on her gallbladder which have been normal.  This is included an MRCP which was normal regarding her gallbladder.  I do not feel this needs to be repeated tonight.  Her lipase is markedly elevated at almost 3000.  Her other labs are non-concerning.  Her vital signs are stable.  She was treated  in the ED with IV fluids as well as pain medications and antiemetics.  I spoke with Dr. Onalee Huaavid who will admit the patient for further treatment. Final Clinical Impressions(s) / ED Diagnoses   Final diagnoses:  Idiopathic acute pancreatitis, unspecified complication status    ED Discharge Orders    None       Rolan BuccoBelfi, Shirlean Berman, MD 03/30/18 2218

## 2018-03-30 NOTE — H&P (Signed)
History and Physical    Evelyn Dillon EAV:[id number] DOB: Nov 29, 1960 DOA: 03/30/2018  PCP: Hoy RegisterNewlin, Enobong, MD  Patient coming from: Home  Chief Complaint: Abdominal pain  HPI: Evelyn PilarMaria I Evelyn Dillon is a 58 y.o. female with medical history significant of recurrent pancreatitis of unclear etiology with no history of alcohol usage at all comes in with progressive worsening onset of epigastric pain with associated nausea vomiting that started at 6 PM tonight.  No diarrhea.  No fevers.  This is typical for her pancreatitis.  She has had extensive work-up and have not found etiology of her recurrent pancreatitis.  She is had previous triglyceride levels that are normal on previous work-up of her gallbladder which have been normal.  Patient found to have acute pancreatitis again with a lipase level of 3000.  Patient referred for admission for acute pancreatitis.  Review of Systems: As per HPI otherwise 10 point review of systems negative.   Past Medical History:  Diagnosis Date  . Kidney infection   . Pancreatitis   . Syncope    a. 04/2014: tele unrevealing, echo with normal EF & grade 2 DD, normal nuc. Etiology not clear.    Past Surgical History:  Procedure Laterality Date  . TUBAL LIGATION     Patient states that she had birth control surgery, possible tubal ligation     reports that she has never smoked. She has never used smokeless tobacco. She reports that she does not drink alcohol or use drugs.  No Known Allergies  Family History  Problem Relation Age of Onset  . Diabetes Mellitus I Sister   . Heart disease Sister        Died at 6238    Prior to Admission medications   Medication Sig Start Date End Date Taking? Authorizing Provider  cetirizine (ZYRTEC) 10 MG tablet Take 1 tablet (10 mg total) by mouth daily. Patient not taking: Reported on 03/30/2018 03/15/17   Hoy RegisterNewlin, Enobong, MD  Vitamin D, Ergocalciferol, (DRISDOL) 50000 units CAPS capsule Take 1 capsule (50,000 Units total) by mouth  every 7 (seven) days. Patient not taking: Reported on 03/30/2018 03/15/17   Hoy RegisterNewlin, Enobong, MD    Physical Exam: Vitals:   03/30/18 1920 03/30/18 1946  BP: 139/89 (!) 145/77  Pulse: 64 (!) 58  Resp: 16 18  Temp: 98 F (36.7 C) 97.8 F (36.6 C)  TempSrc: Oral   SpO2: 100% 100%    Constitutional: NAD, calm, comfortable Vitals:   03/30/18 1920 03/30/18 1946  BP: 139/89 (!) 145/77  Pulse: 64 (!) 58  Resp: 16 18  Temp: 98 F (36.7 C) 97.8 F (36.6 C)  TempSrc: Oral   SpO2: 100% 100%   Eyes: PERRL, lids and conjunctivae normal ENMT: Mucous membranes are moist. Posterior pharynx clear of any exudate or lesions.Normal dentition.  Neck: normal, supple, no masses, no thyromegaly Respiratory: clear to auscultation bilaterally, no wheezing, no crackles. Normal respiratory effort. No accessory muscle use.  Cardiovascular: Regular rate and rhythm, no murmurs / rubs / gallops. No extremity edema. 2+ pedal pulses. No carotid bruits.  Abdomen: Mild epigastric tenderness, no masses palpated. No hepatosplenomegaly. Bowel sounds positive.  Musculoskeletal: no clubbing / cyanosis. No joint deformity upper and lower extremities. Good ROM, no contractures. Normal muscle tone.  Skin: no rashes, lesions, ulcers. No induration Neurologic: CN 2-12 grossly intact. Sensation intact, DTR normal. Strength 5/5 in all 4.  Psychiatric: Normal judgment and insight. Alert and oriented x 3. Normal mood.    Labs on  Admission: I have personally reviewed following labs and imaging studies  CBC: Recent Labs  Lab 03/30/18 2004  WBC 7.9  HGB 12.5  HCT 38.8  MCV 89.4  PLT 229   Basic Metabolic Panel: Recent Labs  Lab 03/30/18 2004  NA 140  K 3.9  CL 107  CO2 20*  GLUCOSE 118*  BUN 15  CREATININE 0.73  CALCIUM 9.2   GFR: CrCl cannot be calculated (Unknown ideal weight.). Liver Function Tests: Recent Labs  Lab 03/30/18 2004  AST 36  ALT 26  ALKPHOS 70  BILITOT 0.4  PROT 6.9  ALBUMIN 4.0     Recent Labs  Lab 03/30/18 2004  LIPASE 2,991*   No results for input(s): AMMONIA in the last 168 hours. Coagulation Profile: No results for input(s): INR, PROTIME in the last 168 hours. Cardiac Enzymes: No results for input(s): CKTOTAL, CKMB, CKMBINDEX, TROPONINI in the last 168 hours. BNP (last 3 results) No results for input(s): PROBNP in the last 8760 hours. HbA1C: No results for input(s): HGBA1C in the last 72 hours. CBG: No results for input(s): GLUCAP in the last 168 hours. Lipid Profile: No results for input(s): CHOL, HDL, LDLCALC, TRIG, CHOLHDL, LDLDIRECT in the last 72 hours. Thyroid Function Tests: No results for input(s): TSH, T4TOTAL, FREET4, T3FREE, THYROIDAB in the last 72 hours. Anemia Panel: No results for input(s): VITAMINB12, FOLATE, FERRITIN, TIBC, IRON, RETICCTPCT in the last 72 hours. Urine analysis:    Component Value Date/Time   COLORURINE YELLOW 03/30/2018 2011   APPEARANCEUR CLEAR 03/30/2018 2011   LABSPEC 1.010 03/30/2018 2011   PHURINE 7.0 03/30/2018 2011   GLUCOSEU NEGATIVE 03/30/2018 2011   HGBUR NEGATIVE 03/30/2018 2011   BILIRUBINUR NEGATIVE 03/30/2018 2011   KETONESUR NEGATIVE 03/30/2018 2011   PROTEINUR NEGATIVE 03/30/2018 2011   UROBILINOGEN 0.2 01/03/2015 2238   NITRITE NEGATIVE 03/30/2018 2011   LEUKOCYTESUR NEGATIVE 03/30/2018 2011   Sepsis Labs: !!!!!!!!!!!!!!!!!!!!!!!!!!!!!!!!!!!!!!!!!!!! @LABRCNTIP (procalcitonin:4,lacticidven:4) )No results found for this or any previous visit (from the past 240 hour(s)).   Radiological Exams on Admission: No results found.  Old chart reviewed Case discussed with EDP Dr. Fredderick Phenix  Assessment/Plan 58 year old female with recurrent pancreatitis idiopathic Principal Problem:   Acute pancreatitis-give 2 L of IV fluids.  Aggressive IV fluids overnight.  N.p.o.  Bowel rest.  Check triglyceride level.  Conservative treatment.  Active Problems:   Upper abdominal pain-improved with morphine  continue morphine as needed.    DVT prophylaxis: SCDs Code Status: Full Family Communication: Daughter and granddaughter Disposition Plan: Days Consults called: None Admission status: Admission   Twilla Khouri A MD [facility]  If 7PM-7AM, please contact night-coverage [email or web address] Password [facility]  03/30/2018, 10:31 PM

## 2018-03-30 NOTE — ED Triage Notes (Signed)
Use of interpretor.  Pt has same pain that she was "admitted for".  Hx of pancreatitis.  Reports no changes or alcohol today.

## 2018-03-31 ENCOUNTER — Inpatient Hospital Stay (HOSPITAL_COMMUNITY): Payer: Self-pay

## 2018-03-31 DIAGNOSIS — E669 Obesity, unspecified: Secondary | ICD-10-CM

## 2018-03-31 DIAGNOSIS — R739 Hyperglycemia, unspecified: Secondary | ICD-10-CM

## 2018-03-31 DIAGNOSIS — D649 Anemia, unspecified: Secondary | ICD-10-CM

## 2018-03-31 LAB — CBC
HCT: 34.6 % — ABNORMAL LOW (ref 36.0–46.0)
Hemoglobin: 11.1 g/dL — ABNORMAL LOW (ref 12.0–15.0)
MCH: 28.7 pg (ref 26.0–34.0)
MCHC: 32.1 g/dL (ref 30.0–36.0)
MCV: 89.4 fL (ref 80.0–100.0)
NRBC: 0 % (ref 0.0–0.2)
Platelets: 190 10*3/uL (ref 150–400)
RBC: 3.87 MIL/uL (ref 3.87–5.11)
RDW: 12.9 % (ref 11.5–15.5)
WBC: 4.2 10*3/uL (ref 4.0–10.5)

## 2018-03-31 LAB — COMPREHENSIVE METABOLIC PANEL
ALT: 23 U/L (ref 0–44)
ANION GAP: 8 (ref 5–15)
AST: 22 U/L (ref 15–41)
Albumin: 3.1 g/dL — ABNORMAL LOW (ref 3.5–5.0)
Alkaline Phosphatase: 64 U/L (ref 38–126)
BUN: 10 mg/dL (ref 6–20)
CO2: 22 mmol/L (ref 22–32)
Calcium: 8.2 mg/dL — ABNORMAL LOW (ref 8.9–10.3)
Chloride: 112 mmol/L — ABNORMAL HIGH (ref 98–111)
Creatinine, Ser: 0.61 mg/dL (ref 0.44–1.00)
GFR calc Af Amer: >60 mL/min (ref >60)
GFR calc non Af Amer: >60 mL/min (ref >60)
Glucose, Bld: 94 mg/dL (ref 70–99)
POTASSIUM: 3.8 mmol/L (ref 3.5–5.1)
Sodium: 142 mmol/L (ref 135–145)
Total Bilirubin: 0.4 mg/dL (ref 0.3–1.2)
Total Protein: 5.5 g/dL — ABNORMAL LOW (ref 6.5–8.1)

## 2018-03-31 LAB — LIPASE, BLOOD: Lipase: 394 U/L — ABNORMAL HIGH (ref 11–51)

## 2018-03-31 LAB — TRIGLYCERIDES: TRIGLYCERIDES: 76 mg/dL (ref <150)

## 2018-03-31 MED ORDER — ENOXAPARIN SODIUM 40 MG/0.4ML ~~LOC~~ SOLN
40.0000 mg | SUBCUTANEOUS | Status: DC
  Administered 2018-03-31 – 2018-04-01 (×2): 40 mg via SUBCUTANEOUS
  Filled 2018-03-31 (×2): qty 0.4

## 2018-03-31 MED ORDER — KETOROLAC TROMETHAMINE 30 MG/ML IJ SOLN
30.0000 mg | Freq: Once | INTRAMUSCULAR | Status: AC
  Administered 2018-03-31: 30 mg via INTRAVENOUS
  Filled 2018-03-31: qty 1

## 2018-03-31 NOTE — Progress Notes (Signed)
PROGRESS NOTE    Evelyn Dillon  KGM:[id number] DOB: 1960-10-02 DOA: 03/30/2018 PCP: Hoy Register, MD   Brief Narrative:  HPI per Dr. Tarry Kos on 03/30/2018 Evelyn Dillon is a 58 y.o. female with medical history significant of recurrent pancreatitis of unclear etiology with no history of alcohol usage at all comes in with progressive worsening onset of epigastric pain with associated nausea vomiting that started at 6 PM tonight.  No diarrhea.  No fevers.  This is typical for her pancreatitis.  She has had extensive work-up and have not found etiology of her recurrent pancreatitis.  She is had previous triglyceride levels that are normal on previous work-up of her gallbladder which have been normal.  Patient found to have acute pancreatitis again with a lipase level of 3000.  Patient referred for admission for acute pancreatitis.  **States she still has abdominal Pain and was having a headache now. Continuing Conservative Management and Supportive Care with IVF Resuscitation and Pain Control.   Assessment & Plan:   Principal Problem:   Acute pancreatitis Active Problems:   Upper abdominal pain  Recurrent Acute Pancreatitis associated with Abdominal Pain -Admitted to Med-Surge -Unclear Etiology at this Point; Lipase Level is Improving and went from 2,991 -> 394 -TG was 76 -T Bili was 0.4 -No imaging done and last imaging has been in 2017-2018 so will start with Abdominal U/S  -Given 2 Liters of IVF boluses and and will C/w IVF Resuscitation and C/w NS but decrease rate from 150 mL/hr to 100 mL/hr -C/w NPO except Sips with Meds and Bowel Rest -C/w Pain Control with IV Morphine 2 mg q2hprn Moderate/Severe Pain  -Given Famotidine in the 20 mg x1 -C/w Supportive Care and Antiemetics with po/IV Zofran 4 mg q6hprn Nausea -Check UDS  -IF not improving will consider GI Consult and Possibly TPN  Headache -Given 1x dose of IV Toradol 30 mg   Normocytic Anemia -Patient's Hb/Hct went from  12.5/38.8 on admission to 11.1/34.6 -Has Hx of IDA -Likely Dilutional Drop in the setting of IVF Resuscitation  -Check Anemia Panel in the AM -Continue to Monitor for S/Sx of Bleeding -Repeat CBC in AM   Hyperglycemia -Patient's Glucose on Admission was 118 -Check HbA1c and continue to Monitor Blood Sugars Carefully  Obesity -Estimated body mass index is 30.03 kg/m as calculated from the following:   Height as of this encounter: 5\' 1"  (1.549 m).   Weight as of this encounter: 72.1 kg. -Weight Loss and Dietary Counseling given   Hx of Adjustment Disorder with Depressed Mood -Continue to Monitor   DVT prophylaxis: SCDs; Will start on Enoxaparin 40 mg sq q24h Code Status: FULL CODE Family Communication: No family present at bedside Disposition Plan: Home when improved and tolerating food without Abdominal Pain  Consultants:  None   Procedures:  None   Antimicrobials:  Anti-infectives (From admission, onward)   None     Subjective: Bedside states that she is still having abdominal pain.  Her main complaint was that she started having a headache.  No chest pain.  She speaks mainly Spanish but is able to converse with me and tell me her needs.  No other concerns or complaints at this time and discussed with her about bowel rest and she understands.  Objective: Vitals:   03/30/18 2332 03/31/18 0402 03/31/18 0748 03/31/18 1141  BP: (!) 123/99 100/65 106/69 120/72  Pulse: 64 (!) 59 (!) 59 (!) 56  Resp: 20 18 16 16   Temp: 97.7  F (36.5 C) 97.9 F (36.6 C) 98.3 F (36.8 C) 98.2 F (36.8 C)  TempSrc: Oral Oral Oral Oral  SpO2: 96% 95% 97% 97%  Weight: 72.1 kg     Height: 5\' 1"  (1.549 m)       Intake/Output Summary (Last 24 hours) at 03/31/2018 1232 Last data filed at 03/31/2018 [id number] Gross per 24 hour  Intake 1550 ml  Output -  Net 1550 ml   Filed Weights   03/30/18 2332  Weight: 72.1 kg   Examination: Physical Exam:  Constitutional: WN/WD obese female in NAD  and appears calm but uncomfortable Eyes: Lids and conjunctivae normal, sclerae anicteric  ENMT: External Ears, Nose appear normal. Grossly normal hearing.  Neck: Appears normal, supple, no cervical masses, normal ROM, no appreciable thyromegaly; no JVD Respiratory: Diminished to auscultation bilaterally, no wheezing, rales, rhonchi or crackles. Normal respiratory effort and patient is not tachypenic. No accessory muscle use.  Cardiovascular: RRR, no murmurs / rubs / gallops. S1 and S2 auscultated. No extremity edema.  Abdomen: Soft, Tender to palpate, Distended 2/2 body habitus. No masses palpated. No appreciable hepatosplenomegaly. Bowel sounds positive x4.  GU: Deferred. Musculoskeletal: No clubbing / cyanosis of digits/nails. No joint deformity upper and lower extremities.  Skin: No rashes, lesions, ulcers on a limited skin evaluation. No induration; Warm and dry.  Neurologic: CN 2-12 grossly intact with no focal deficits. . Romberg sign and cerebellar reflexes not assessed.  Psychiatric: Normal judgment and insight. Alert and oriented x 3. Normal mood and appropriate affect.   Data Reviewed: I have personally reviewed following labs and imaging studies  CBC: Recent Labs  Lab 03/30/18 2004 03/31/18 0621  WBC 7.9 4.2  HGB 12.5 11.1*  HCT 38.8 34.6*  MCV 89.4 89.4  PLT 229 190   Basic Metabolic Panel: Recent Labs  Lab 03/30/18 2004 03/31/18 0621  NA 140 142  K 3.9 3.8  CL 107 112*  CO2 20* 22  GLUCOSE 118* 94  BUN 15 10  CREATININE 0.73 0.61  CALCIUM 9.2 8.2*   GFR: Estimated Creatinine Clearance: 69.6 mL/min (by C-G formula based on SCr of 0.61 mg/dL). Liver Function Tests: Recent Labs  Lab 03/30/18 2004 03/31/18 0621  AST 36 22  ALT 26 23  ALKPHOS 70 64  BILITOT 0.4 0.4  PROT 6.9 5.5*  ALBUMIN 4.0 3.1*   Recent Labs  Lab 03/30/18 2004 03/31/18 [id number]  LIPASE 2,991* 394*   No results for input(s): AMMONIA in the last 168 hours. Coagulation Profile: No  results for input(s): INR, PROTIME in the last 168 hours. Cardiac Enzymes: No results for input(s): CKTOTAL, CKMB, CKMBINDEX, TROPONINI in the last 168 hours. BNP (last 3 results) No results for input(s): PROBNP in the last 8760 hours. HbA1C: No results for input(s): HGBA1C in the last 72 hours. CBG: No results for input(s): GLUCAP in the last 168 hours. Lipid Profile: Recent Labs    03/30/18 2347  TRIG 76   Thyroid Function Tests: No results for input(s): TSH, T4TOTAL, FREET4, T3FREE, THYROIDAB in the last 72 hours. Anemia Panel: No results for input(s): VITAMINB12, FOLATE, FERRITIN, TIBC, IRON, RETICCTPCT in the last 72 hours. Sepsis Labs: No results for input(s): PROCALCITON, LATICACIDVEN in the last 168 hours.  No results found for this or any previous visit (from the past 240 hour(s)).      Radiology Studies: No results found.  Scheduled Meds: Continuous Infusions: . sodium chloride 150 mL/hr at 03/31/18 0801    LOS: 1 day  Merlene Laughtermair Latif , DO [facility] PAGER is on AMION  If 7PM-7AM, please contact night-coverage [email or web address] Password [email or web address] 03/31/2018, 12:32 PM

## 2018-03-31 NOTE — Progress Notes (Signed)
   03/31/18 1400  Clinical Encounter Type  Visited With Patient  Visit Type Initial  Responded to Spiritual Care consult for Prayed. Patient does not speak much English however, she understood that I would refer her to a spanish speaking chaplain on Monday. She said "ok". I gave her a spanish Bible. Provided spiritual care and emotional support.

## 2018-04-01 ENCOUNTER — Encounter (HOSPITAL_COMMUNITY): Payer: Self-pay

## 2018-04-01 DIAGNOSIS — R7301 Impaired fasting glucose: Secondary | ICD-10-CM

## 2018-04-01 DIAGNOSIS — R7303 Prediabetes: Secondary | ICD-10-CM

## 2018-04-01 LAB — MAGNESIUM: Magnesium: 2 mg/dL (ref 1.7–2.4)

## 2018-04-01 LAB — COMPREHENSIVE METABOLIC PANEL
ALK PHOS: 71 U/L (ref 38–126)
ALT: 23 U/L (ref 0–44)
ANION GAP: 7 (ref 5–15)
AST: 22 U/L (ref 15–41)
Albumin: 3.2 g/dL — ABNORMAL LOW (ref 3.5–5.0)
BUN: 7 mg/dL (ref 6–20)
CO2: 23 mmol/L (ref 22–32)
Calcium: 8.3 mg/dL — ABNORMAL LOW (ref 8.9–10.3)
Chloride: 110 mmol/L (ref 98–111)
Creatinine, Ser: 0.63 mg/dL (ref 0.44–1.00)
GFR calc Af Amer: >60 mL/min (ref >60)
GFR calc non Af Amer: >60 mL/min (ref >60)
Glucose, Bld: 79 mg/dL (ref 70–99)
Potassium: 3.6 mmol/L (ref 3.5–5.1)
Sodium: 140 mmol/L (ref 135–145)
Total Bilirubin: 0.8 mg/dL (ref 0.3–1.2)
Total Protein: 5.8 g/dL — ABNORMAL LOW (ref 6.5–8.1)

## 2018-04-01 LAB — HEMOGLOBIN A1C
Hgb A1c MFr Bld: 5.7 % — ABNORMAL HIGH (ref 4.8–5.6)
Mean Plasma Glucose: 116.89 mg/dL

## 2018-04-01 LAB — CBC WITH DIFFERENTIAL/PLATELET
ABS IMMATURE GRANULOCYTES: 0.01 10*3/uL (ref 0.00–0.07)
Basophils Absolute: 0 10*3/uL (ref 0.0–0.1)
Basophils Relative: 0 %
Eosinophils Absolute: 0.1 10*3/uL (ref 0.0–0.5)
Eosinophils Relative: 2 %
HCT: 36.4 % (ref 36.0–46.0)
HEMOGLOBIN: 11.4 g/dL — AB (ref 12.0–15.0)
Immature Granulocytes: 0 %
Lymphocytes Relative: 36 %
Lymphs Abs: 1.5 10*3/uL (ref 0.7–4.0)
MCH: 27.9 pg (ref 26.0–34.0)
MCHC: 31.3 g/dL (ref 30.0–36.0)
MCV: 89.2 fL (ref 80.0–100.0)
Monocytes Absolute: 0.4 10*3/uL (ref 0.1–1.0)
Monocytes Relative: 9 %
Neutro Abs: 2.3 10*3/uL (ref 1.7–7.7)
Neutrophils Relative %: 53 %
Platelets: 170 10*3/uL (ref 150–400)
RBC: 4.08 MIL/uL (ref 3.87–5.11)
RDW: 12.6 % (ref 11.5–15.5)
WBC: 4.3 10*3/uL (ref 4.0–10.5)
nRBC: 0 % (ref 0.0–0.2)

## 2018-04-01 LAB — RAPID URINE DRUG SCREEN, HOSP PERFORMED
Amphetamines: NOT DETECTED
Barbiturates: NOT DETECTED
Benzodiazepines: NOT DETECTED
Cocaine: NOT DETECTED
OPIATES: POSITIVE — AB
Tetrahydrocannabinol: NOT DETECTED

## 2018-04-01 LAB — FOLATE: Folate: 31.1 ng/mL (ref >5.9)

## 2018-04-01 LAB — RETICULOCYTES
Immature Retic Fract: 9.8 % (ref 2.3–15.9)
RBC.: 4.08 MIL/uL (ref 3.87–5.11)
RETIC COUNT ABSOLUTE: 55.1 10*3/uL (ref 19.0–186.0)
Retic Ct Pct: 1.4 % (ref 0.4–3.1)

## 2018-04-01 LAB — IRON AND TIBC
Iron: 86 ug/dL (ref 28–170)
Saturation Ratios: 29 % (ref 10.4–31.8)
TIBC: 300 ug/dL (ref 250–450)
UIBC: 214 ug/dL

## 2018-04-01 LAB — LIPASE, BLOOD: Lipase: 49 U/L (ref 11–51)

## 2018-04-01 LAB — FERRITIN: Ferritin: 37 ng/mL (ref 11–307)

## 2018-04-01 LAB — VITAMIN B12: Vitamin B-12: 274 pg/mL (ref 180–914)

## 2018-04-01 LAB — PHOSPHORUS: Phosphorus: 2.8 mg/dL (ref 2.5–4.6)

## 2018-04-01 MED ORDER — ALBUTEROL SULFATE (2.5 MG/3ML) 0.083% IN NEBU: 2.5000 mg | INHALATION_SOLUTION | RESPIRATORY_TRACT | Status: DC | PRN

## 2018-04-01 NOTE — Plan of Care (Signed)
Patient stable, discussed POC with patient, agreeable with plan, advanced diet tolerated well, denies question/concerns at this time.

## 2018-04-01 NOTE — Progress Notes (Signed)
PROGRESS NOTE    Evelyn Dillon  SJG:[id number] DOB: September 01, 1960 DOA: 03/30/2018 PCP: Hoy Register, MD   Brief Narrative:  HPI per Dr. Tarry Kos on 03/30/2018 Evelyn Dillon is a 58 y.o. female with medical history significant of recurrent pancreatitis of unclear etiology with no history of alcohol usage at all comes in with progressive worsening onset of epigastric pain with associated nausea vomiting that started at 6 PM tonight.  No diarrhea.  No fevers.  This is typical for her pancreatitis.  She has had extensive work-up and have not found etiology of her recurrent pancreatitis.  She is had previous triglyceride levels that are normal on previous work-up of her gallbladder which have been normal.  Patient found to have acute pancreatitis again with a lipase level of 3000.  Patient referred for admission for acute pancreatitis.  **Continuing Conservative Management and Supportive Care with IVF Resuscitation and Pain Control.  Her pain is much improved and she is started on a clear liquid diet and will advance to full liquid diet.  If she is able to tolerate full liquid diet later on today we will go to soft and if she tolerates soft can likely discharge in a.m.  Assessment & Plan:   Principal Problem:   Acute pancreatitis Active Problems:   Upper abdominal pain  Recurrent Acute Pancreatitis associated with Abdominal Pain, improving  -Admitted to Med-Surge -Unclear Etiology at this Point; Lipase Level is Improving and went from 2,991 -> 394 -> 49 -TG was 76 -T Bili was 0.4 and is now 0.8 -No imaging done and last imaging has been in 2017-2018 so will start with Abdominal U/S  -Abdominal U/S showed no acute findings and normal gallbladder with common bile duct.  There is an ill-defined hyperacute region adjacent to the gallbladder fossa that was similar to prior ultrasound of 09/12/2016 -Given 2 Liters of IVF boluses and and will C/w IVF Resuscitation and C/w NS but decrease rate from 150  mL/hr to 100 mL/hr now to 75 mL's per hour -Stopped NPO except Sips with Meds and will to a clear liquid diet with advancing diet as tolerated to a full liquid diet later today and possibly even soft -C/w Pain Control with IV Morphine 2 mg q2hprn Moderate/Severe Pain  -Given Famotidine in the 20 mg x1 -C/w Supportive Care and Antiemetics with po/IV Zofran 4 mg q6hprn Nausea -Check UDS this is still pending to be done -IF not improving will consider GI Consult and Possibly TPN  Headache -Given 1x dose of IV Toradol 30 mg today with improvement -Continue monitor closely  Normocytic Anemia -Patient's Hb/Hct went from 12.5/38.8 on admission to 11.1/34.6 and is now 11.4/36.4 -Has Hx of IDA -Likely Dilutional Drop in the setting of IVF Resuscitation  -Checked Anemia Panel this AM and showed an iron level of 86, U IBC of 214, TIBC of 300, saturation ratios 29%, ferritin level 37, folate level of 31.1, and vitamin B12 of 274 -Continue to Monitor for S/Sx of Bleeding -Repeat CBC in AM   Hyperglycemia setting of prediabetes/impaired fasting glucose -Patient's Glucose on Admission was 118 -Checked HbA1c and was 5.7 -Continue to Monitor Blood Sugars Carefully and if consistently elevated will place on a sensitive NovoLog sliding scale AC  Obesity -Estimated body mass index is 30.03 kg/m as calculated from the following:   Height as of this encounter: 5\' 1"  (1.549 m).   Weight as of this encounter: 72.1 kg. -Weight Loss and Dietary Counseling given   Hx  of Adjustment Disorder with Depressed Mood -Continue to Monitor   DVT prophylaxis: SCDs; Will start on Enoxaparin 40 mg sq q24h Code Status: FULL CODE Family Communication: No family present at bedside Disposition Plan: Home when improved and tolerating food without Abdominal Pain and anticipate this is to happen next 24 to 48 hours  Consultants:  None   Procedures:  None   Antimicrobials:  Anti-infectives (From admission, onward)     None     Subjective: Seen and examined at bedside and states that her abdominal pain is much improved and she would not have any.  States her headache was better after she was given the Toradol.  No nausea or vomiting.  No chest pain,.  States that she was feeling hungry.  No other concerns or complaints at this time  Objective: Vitals:   03/31/18 2342 04/01/18 0418 04/01/18 0728 04/01/18 1138  BP: 112/70 113/70 111/64 100/64  Pulse: (!) 59 (!) 57 63 61  Resp: 18 18 16 16   Temp: 98.3 F (36.8 C) 98.3 F (36.8 C) 97.9 F (36.6 C) (!) 97.5 F (36.4 C)  TempSrc: Oral Oral Oral Oral  SpO2: 96% 97% 97% 98%  Weight:      Height:        Intake/Output Summary (Last 24 hours) at 04/01/2018 1228 Last data filed at 04/01/2018 0422 Gross per 24 hour  Intake 1450 ml  Output 1500 ml  Net -50 ml   Filed Weights   03/30/18 2332  Weight: 72.1 kg   Examination: Physical Exam:  Constitutional: Well-nourished, well-developed obese female in no acute distress appears more comfortable today sitting in the chair bedside Eyes: Lids and conjunctive are normal.  Sclera anicteric ENMT: External ears and nose appear normal.  Grossly normal hearing Neck: Supple with no JVD Respiratory: Slightly diminished to auscultation bilaterally no appreciable wheezing, rales, rhonchi.  Patient not tachypneic wheezing and accessory muscles to breathe Cardiovascular: Regular rate and rhythm.  No appreciable murmurs, rubs, gallops.  Has trace LE extremity edema Abdomen: Soft, not tender to palpate today.  Distended secondary body habitus.  Bowel sounds present in 4 quadrants GU: Deferred Musculoskeletal: No clubbing or cyanosis.  No deformities noted in upper extremities Skin: No appreciable rashes or lesions on physical evaluation Neurologic: Cranial nerves II through XII grossly intact no appreciable focal deficits.  Romberg sign cerebellar reflexes not assessed Psychiatric: Normal judgment and insight.   Patient is awake and alert and oriented x3.  Mood and affect  Data Reviewed: I have personally reviewed following labs and imaging studies  CBC: Recent Labs  Lab 03/30/18 2004 03/31/18 0621 04/01/18 0613  WBC 7.9 4.2 4.3  NEUTROABS  --   --  2.3  HGB 12.5 11.1* 11.4*  HCT 38.8 34.6* 36.4  MCV 89.4 89.4 89.2  PLT 229 190 170   Basic Metabolic Panel: Recent Labs  Lab 03/30/18 2004 03/31/18 0621 04/01/18 0613  NA 140 142 140  K 3.9 3.8 3.6  CL 107 112* 110  CO2 20* 22 23  GLUCOSE 118* 94 79  BUN 15 10 7   CREATININE 0.73 0.61 0.63  CALCIUM 9.2 8.2* 8.3*  MG  --   --  2.0  PHOS  --   --  2.8   GFR: Estimated Creatinine Clearance: 69.6 mL/min (by C-G formula based on SCr of 0.63 mg/dL). Liver Function Tests: Recent Labs  Lab 03/30/18 2004 03/31/18 0621 04/01/18 0613  AST 36 22 22  ALT 26 23 23   ALKPHOS  70 64 71  BILITOT 0.4 0.4 0.8  PROT 6.9 5.5* 5.8*  ALBUMIN 4.0 3.1* 3.2*   Recent Labs  Lab 03/30/18 2004 03/31/18 0539 04/01/18 0613  LIPASE 2,991* 394* 49   No results for input(s): AMMONIA in the last 168 hours. Coagulation Profile: No results for input(s): INR, PROTIME in the last 168 hours. Cardiac Enzymes: No results for input(s): CKTOTAL, CKMB, CKMBINDEX, TROPONINI in the last 168 hours. BNP (last 3 results) No results for input(s): PROBNP in the last 8760 hours. HbA1C: Recent Labs    04/01/18 0613  HGBA1C 5.7*   CBG: No results for input(s): GLUCAP in the last 168 hours. Lipid Profile: Recent Labs    03/30/18 2347  TRIG 76   Thyroid Function Tests: No results for input(s): TSH, T4TOTAL, FREET4, T3FREE, THYROIDAB in the last 72 hours. Anemia Panel: Recent Labs    04/01/18 0613  VITAMINB12 274  FOLATE 31.1  FERRITIN 37  TIBC 300  IRON 86  RETICCTPCT 1.4   Sepsis Labs: No results for input(s): PROCALCITON, LATICACIDVEN in the last 168 hours.  No results found for this or any previous visit (from the past 240 hour(s)).        Radiology Studies: US Abdomen Limited Ruq  Result Date: 03/31/2018 CLINICAL DATA:  Pancreatitis EXAM: ULTRASOUND ABDOMEN LIMITED RIGHT UPPER QUADRANT COMPARISON:  None. FINDINGS: Gallbladder: No gallstones or wall thickening visualized. No sonographic Murphy sign noted by sonographer. Common bile duct: Diameter: Normal 4 mm Liver: Along the gallbladder fossa there is region of hypoechogenicity in the liver parenchyma which is ill-defined. This is similar to comparison ultrasound of 09/12/16. No biliary duct dilatation. No focal lesion. Portal vein is patent on color Doppler imaging with normal direction of blood flow towards the liver. IMPRESSION: 1. No acute findings. 2. Normal gallbladder and common bile duct. 3. Ill-defined hypo echoic region adjacent to gallbladder fossa is similar to comparison ultrasound. Electronically Signed   By: Genevive Bi M.D.   On: 03/31/2018 16:45   Scheduled Meds: . enoxaparin (LOVENOX) injection  40 mg Subcutaneous Q24H   Continuous Infusions: . sodium chloride 75 mL/hr at 04/01/18 0910    LOS: 2 days   Merlene Laughter, DO [facility] PAGER is on AMION  If 7PM-7AM, please contact night-coverage [email or web address] Password [facility] 04/01/2018, 12:28 PM

## 2018-04-02 DIAGNOSIS — R739 Hyperglycemia, unspecified: Secondary | ICD-10-CM

## 2018-04-02 DIAGNOSIS — E669 Obesity, unspecified: Secondary | ICD-10-CM

## 2018-04-02 MED ORDER — IBUPROFEN 800 MG PO TABS: 800.0000 mg | ORAL_TABLET | Freq: Three times a day (TID) | ORAL | 0 refills | Status: DC | PRN

## 2018-04-02 MED ORDER — ONDANSETRON 4 MG PO TBDP: 4.0000 mg | ORAL_TABLET | Freq: Three times a day (TID) | ORAL | 0 refills | Status: DC | PRN

## 2018-04-02 NOTE — Care Management Note (Signed)
Case Management Note  Patient Details  Name: Evelyn Dillon MRN: [id number] Date of Birth: 02/05/1961  Subjective/Objective:   Pt admitted with acute pancreatitis. She is from home with family. Pt uses [facility] for her PCP.                  Action/Plan: Pt discharging home with self care. Pt medications sent to [facility]. CM provided coupon card to assist with the cost of her meds. Pt has transportation home.  Expected Discharge Date:  04/02/18               Expected Discharge Plan:  Home/Self Care  In-House Referral:     Discharge planning Services  CM Consult, Medication Assistance  Post Acute Care Choice:    Choice offered to:     DME Arranged:    DME Agency:     HH Arranged:    HH Agency:     Status of Service:  Completed, signed off  If discussed at [employer] of Stay Meetings, dates discussed:    Additional Comments:  Kermit Balo, RN 04/02/2018, 11:33 AM

## 2018-04-02 NOTE — Discharge Summary (Signed)
Physician Discharge Summary  Evelyn Dillon KPT:[id number] DOB: 1960-09-28 DOA: 03/30/2018  PCP: Hoy Register, MD  Admit date: 03/30/2018 Discharge date: 04/02/2018  Admitted From: Home Disposition:  Home  Recommendations for Outpatient Follow-up:  1. Follow up with PCP in 1 week, information for [facility]  2. Would recommend outpatient work up and treatment for urge incontinence. Patient describes incontinence ongoing for 4 years and unable to make it to the bathroom in time.   Discharge Condition: Stable CODE STATUS: Full code  Diet recommendation: Regular diet   Brief/Interim Summary: Evelyn Kreisel Perezis a 58 y.o.femalewith medical history significant ofrecurrent pancreatitis of unclear etiology with no history of alcohol usage at all comes in with progressive worsening onset of epigastric pain with associated nausea vomiting that started at 6 PM tonight. No diarrhea. No fevers. This is typical for her pancreatitis. She has had extensive work-up and have not found etiology of her recurrent pancreatitis. She is had previous triglyceride levels that are normal on previous work-up of her gallbladder which have been normal. Patient found to have acute pancreatitis again with a lipase level of 3000. Patient referred for admission for acute pancreatitis. She was treated with supportive care and continued to improve. On morning of discharge, she was tolerating regular diet without severe nausea, vomiting, abdominal pain. Spanish interpreter used to explain discharge and follow up recommendations.   Discharge Diagnoses:  Principal Problem:   Recurrent pancreatitis Active Problems:   Upper abdominal pain   Hyperglycemia   Obesity (BMI 30.0-34.9)  Discharge Instructions  Discharge Instructions    Call MD for:  difficulty breathing, headache or visual disturbances   Complete by:  As directed    Call MD for:  extreme fatigue   Complete by:  As directed     Call MD for:  hives   Complete by:  As directed    Call MD for:  persistant dizziness or light-headedness   Complete by:  As directed    Call MD for:  persistant nausea and vomiting   Complete by:  As directed    Call MD for:  severe uncontrolled pain   Complete by:  As directed    Call MD for:  temperature >100.4   Complete by:  As directed    Diet general   Complete by:  As directed    Discharge instructions   Complete by:  As directed    You were cared for by a hospitalist during your hospital stay. If you have any questions about your discharge medications or the care you received while you were in the hospital after you are discharged, you can call the unit and ask to speak with the hospitalist on call if the hospitalist that took care of you is not available. Once you are discharged, your primary care physician will handle any further medical issues. Please note that NO REFILLS for any discharge medications will be authorized once you are discharged, as it is imperative that you return to your primary care physician (or establish a relationship with a primary care physician if you do not have one) for your aftercare needs so that they can reassess your need for medications and monitor your lab values.   Increase activity slowly   Complete by:  As directed      Allergies as of 04/02/2018   No Known Allergies     Medication List    STOP taking these medications   cetirizine 10 MG tablet Commonly  known as:  ZYRTEC   Vitamin D (Ergocalciferol) 1.25 MG (50000 UT) Caps capsule Commonly known as:  DRISDOL     TAKE these medications   ibuprofen 800 MG tablet Commonly known as:  ADVIL,MOTRIN Take 1 tablet (800 mg total) by mouth every 8 (eight) hours as needed for fever, headache, mild pain or moderate pain.   ondansetron 4 MG disintegrating tablet Commonly known as:  ZOFRAN ODT Take 1 tablet (4 mg total) by mouth every 8 (eight) hours as needed for nausea or vomiting.       Follow-up Information    [facility]. Call in 1 day(s).   Contact information: [address] [place] [address] [phone]         No Known Allergies  Consultations:  None   Procedures/Studies: US Abdomen Limited Ruq  Result Date: 03/31/2018 CLINICAL DATA:  Pancreatitis EXAM: ULTRASOUND ABDOMEN LIMITED RIGHT UPPER QUADRANT COMPARISON:  None. FINDINGS: Gallbladder: No gallstones or wall thickening visualized. No sonographic Murphy sign noted by sonographer. Common bile duct: Diameter: Normal 4 mm Liver: Along the gallbladder fossa there is region of hypoechogenicity in the liver parenchyma which is ill-defined. This is similar to comparison ultrasound of 09/12/16. No biliary duct dilatation. No focal lesion. Portal vein is patent on color Doppler imaging with normal direction of blood flow towards the liver. IMPRESSION: 1. No acute findings. 2. Normal gallbladder and common bile duct. 3. Ill-defined hypo echoic region adjacent to gallbladder fossa is similar to comparison ultrasound. Electronically Signed   By: Genevive Bi M.D.   On: 03/31/2018 16:45      Discharge Exam: Vitals:   04/02/18 0013 04/02/18 0351  BP: 108/60 114/68  Pulse: 66 (!) 57  Resp: 18 20  Temp: 98.1 F (36.7 C) 98.2 F (36.8 C)  SpO2: 100% 99%    General: Pt is alert, awake, not in acute distress Cardiovascular: RRR, S1/S2 +, no rubs, no gallops Respiratory: CTA bilaterally, no wheezing, no rhonchi Abdominal: Soft, Mildly TTP epigastric, ND, bowel sounds + Extremities: no edema, no cyanosis    The results of significant diagnostics from this hospitalization (including imaging, microbiology, ancillary and laboratory) are listed below for reference.     Microbiology: No results found for this or any previous visit (from the past 240 hour(s)).   Labs: BNP (last 3 results) No results for input(s): BNP in the last 8760 hours. Basic  Metabolic Panel: Recent Labs  Lab 03/30/18 2004 03/31/18 0621 04/01/18 0613  NA 140 142 140  K 3.9 3.8 3.6  CL 107 112* 110  CO2 20* 22 23  GLUCOSE 118* 94 79  BUN 15 10 7   CREATININE 0.73 0.61 0.63  CALCIUM 9.2 8.2* 8.3*  MG  --   --  2.0  PHOS  --   --  2.8   Liver Function Tests: Recent Labs  Lab 03/30/18 2004 03/31/18 0621 04/01/18 0613  AST 36 22 22  ALT 26 23 23   ALKPHOS 70 64 71  BILITOT 0.4 0.4 0.8  PROT 6.9 5.5* 5.8*  ALBUMIN 4.0 3.1* 3.2*   Recent Labs  Lab 03/30/18 2004 03/31/18 0621 04/01/18 0613  LIPASE 2,991* 394* 49   No results for input(s): AMMONIA in the last 168 hours. CBC: Recent Labs  Lab 03/30/18 2004 03/31/18 0621 04/01/18 0613  WBC 7.9 4.2 4.3  NEUTROABS  --   --  2.3  HGB 12.5 11.1* 11.4*  HCT 38.8 34.6* 36.4  MCV 89.4  89.4 89.2  PLT 229 190 170   Cardiac Enzymes: No results for input(s): CKTOTAL, CKMB, CKMBINDEX, TROPONINI in the last 168 hours. BNP: Invalid input(s): POCBNP CBG: No results for input(s): GLUCAP in the last 168 hours. D-Dimer No results for input(s): DDIMER in the last 72 hours. Hgb A1c Recent Labs    04/01/18 0613  HGBA1C 5.7*   Lipid Profile Recent Labs    03/30/18 2347  TRIG 76   Thyroid function studies No results for input(s): TSH, T4TOTAL, T3FREE, THYROIDAB in the last 72 hours.  Invalid input(s): FREET3 Anemia work up Recent Labs    04/01/18 0613  VITAMINB12 274  FOLATE 31.1  FERRITIN 37  TIBC 300  IRON 86  RETICCTPCT 1.4   Urinalysis    Component Value Date/Time   COLORURINE YELLOW 03/30/2018 2011   APPEARANCEUR CLEAR 03/30/2018 2011   LABSPEC 1.010 03/30/2018 2011   PHURINE 7.0 03/30/2018 2011   GLUCOSEU NEGATIVE 03/30/2018 2011   HGBUR NEGATIVE 03/30/2018 2011   BILIRUBINUR NEGATIVE 03/30/2018 2011   KETONESUR NEGATIVE 03/30/2018 2011   PROTEINUR NEGATIVE 03/30/2018 2011   UROBILINOGEN 0.2 01/03/2015 2238   NITRITE NEGATIVE 03/30/2018 2011   LEUKOCYTESUR NEGATIVE  03/30/2018 2011   Sepsis Labs Invalid input(s): PROCALCITONIN,  WBC,  LACTICIDVEN Microbiology No results found for this or any previous visit (from the past 240 hour(s)).   Patient was seen and examined on the day of discharge and was found to be in stable condition. Time coordinating discharge: 35 minutes including assessment and coordination of care, as well as examination of the patient.   SIGNED:  Noralee StainJennifer Kaiyana Bedore, DO [facility] [email or web address] 04/02/2018, 8:38 AM

## 2018-04-02 NOTE — Progress Notes (Signed)
Nurse went over discharge with patient . Patient verbalized understanding of discharge. All Questions and concerns addressed. Discharging home with all belongings.Taking down in a wheelchair.

## 2018-04-02 NOTE — Progress Notes (Addendum)
Please disregard this note:  Patient discharging to [facility] Nurse will call report All discharge Paper work sent with [employer]

## 2018-04-02 NOTE — Discharge Instructions (Signed)
Pancreatitis aguda Acute Pancreatitis  La pancreatitis aguda es una afeccin en la cual el pncreas de repente se irrita y se hincha (tiene inflamacin). El pncreas es una glndula grande ubicada detrs del Strongsvilleestmago. Produce enzimas que ayudan a Retail buyerdigerir la comida. Tambin produce hormonas que ayudan a Geophysical data processorcontrolar el nivel de Bankerazcar en la sangre. La pancreatitis aguda ocurre cuando las enzimas atacan el pncreas y lo daan. La mayor parte de los ataques duran un par de [address]  [id number] y pueden ocasionar problemas graves. Siga estas indicaciones en su casa: Comida y bebida   Siga las indicaciones del mdico respecto de la dieta. Puede ser necesario hacer lo siguiente: ? Evite el alcohol. ? Limite la cantidad de grasa en la dieta.  Consuma pequeas cantidades de comida con ms frecuencia. No consuma grandes cantidades de comida.  Beba suficiente lquido para mantener el pis (orina) claro o de color amarillo plido.  No consuma alcohol si esta fue la causa de su enfermedad. Instrucciones generales  Baxter Internationalome los medicamentos de venta libre y los recetados solamente como se lo haya indicado el mdico.  No use productos que contengan tabaco. Estos incluyen cigarrillos, tabaco para Theatre managermascar y Administrator, Civil Servicecigarrillos electrnicos. Si necesita ayuda para dejar de fumar, consulte al mdico.  Descanse lo suficiente.  Si se lo indican, contrlese en su casa el nivel de azcar en la sangre como se lo haya indicado el mdico.  Concurra a todas las visitas de seguimiento como se lo haya indicado el mdico. Esto es importante. Comunquese con un mdico si:  No mejora en el tiempo en que se esperaba.  Aparecen nuevos sntomas.  Los sntomas empeoran.  Tiene dolor o debilidad persistentes.  Sigue teniendo Programme researcher, broadcasting/film/videomalestar estomacal (nuseas).  Comienza a mejorar y sufre un nuevo ataque de Engineer, miningdolor.  Tiene fiebre. Solicite ayuda de inmediato si:  No puede comer ni retener lquidos.  El dolor se vuelve muy intenso.  Nota que la  piel o la zona blanca del ojo estn amarillas (ictericia).  Devuelve (vomita).  Se siente mareado o se desvanece (se desmaya).  Su nivel de azcar en la sangre es alto (ms de 300mg /dl). Esta informacin no tiene Theme park managercomo fin reemplazar el consejo del mdico. Asegrese de hacerle al mdico cualquier pregunta que tenga. Document Released: 08/30/2011 Document Revised: 08/23/2016 Document Reviewed: 12/02/2014 Elsevier Interactive Patient Education  2019 Elsevier Inc.     Incontinencia urinaria Urinary Incontinence  La incontinencia urinaria es una afeccin en la cual una persona no puede controlar dnde y IT sales professionalcundo orinar. Neomia DearUna persona con esta afeccin orinar en un momento y Administratorun lugar no deseados (involuntariamente). Cules son las causas? Esta afeccin puede ser causada por lo siguiente:  Medicamentos.  Infecciones.  Estreimiento.  Msculos de la vejiga hiperactivos.  Msculos de la vejiga dbiles.  Msculos del suelo de la pelvis dbiles. Estos msculos proporcionan apoyo a la vejiga, el intestino y [address] tero, en el caso de las IXLmujeres.  Aumento del tamao de la prstata en los hombres. La prstata es una glndula cerca de la vejiga. Cuando se Kenyaagranda mucho, puede comprimir la Poplaruretra. Si la uretra est obstruida, la vejiga puede debilitarse y perder la capacidad para vaciarse correctamente.  Ciruga.  Factores emocionales, como la ansiedad, el estrs o el trastorno de estrs postraumtico (TEP).  Prolapso de los rganos de la pelvis. Esto ocurre en las mujeres, cuando los rganos se salen de Environmental consultantlugar, hacia la vagina. Este cambio de lugar puede evitar que la vejiga y la uretra funcionen correctamente. Qu incrementa  el riesgo? Los siguientes factores pueden hacer que usted sea ms propenso a Warehouse managertener esta afeccin:  Edad avanzada.  Obesidad e inactividad fsica.  Embarazo y Hatfieldparto.  Menopausia.  Las enfermedades que Ameren Corporationafectan los nervios o la mdula espinal (enfermedades  neurolgicas).  Tos de larga duracin (crnica). Esto puede aumentar la presin en los msculos de la vejiga y del suelo plvico. Cules son los signos o los sntomas? Los sntomas pueden variar, segn el tipo de incontinencia urinaria que tenga. Estos incluyen los siguientes:  Una necesidad repentina de Geographical information systems officerorinar, y orinar involuntariamente antes de llegar al bao (incontinencia imperiosa).  Orinar repentinamente al realizar una actividad que provoque la miccin, como toser, rer, Radio producerhacer ejercicio o estornudar (incontinencia urinaria de esfuerzo).  Tener ganas de orinar con frecuencia, y Geographical information systems officerorinar en pequeas cantidades o por goteo (incontinencia por rebosamiento).  Orinar porque no puede llegar al bao a tiempo a causa de una discapacidad fsica, como la artritis o una lesin, o de afecciones comunicativas o cognitivas, como la enfermedad de Alzheimer (incontinencia funcional). Cmo se diagnostica? Esta afeccin se puede diagnosticar en funcin de lo siguiente:  Sus antecedentes mdicos.  Un examen fsico.  Estudios, como por ejemplo: ? Anlisis de Comor[place]orina. ? Radiografas del rin y de la vejiga. ? Ecografa. ? Exploracin por tomografa computarizada (TC). ? Cistoscopia. En este procedimiento, el mdico inserta un tubo con Neomia Dearuna luz y Posey Boyeruna cmara (cistoscopio) por la uretra hacia la vejiga para Stage managerdetectar problemas. ? Estudios urodinmicos. Estos estudios evalan la capacidad de la vejiga, de la uretra y del esfnter para Academic librarianalmacenar y Equities traderdescargar orina. Hay distintos tipos de estudios urodinmicos, que pueden variar segn lo que Administrator, sportsdeterminar cada estudio. Para ayudar a diagnosticar la afeccin, el mdico puede recomendarle que mantenga un registro de la frecuencia con la que Comor[place]orina y de la cantidad de Comorosorina. Cmo se trata? El tratamiento de esta afeccin depende del tipo de incontinencia que tenga y de su causa. El tratamiento puede incluir lo siguiente:  Cambios en el estilo de vida, como por  ejemplo: ? Dejar de fumar. ? Mantener un peso saludable. ? Mantenerse activo. Trate de hacer 150minutos de ejercicio de intensidad moderada cada semana. Pregntele al mdico qu actividades son seguras para usted. ? Consumir una dieta saludable.  Evite los alimentos con alto contenido de grasa, como los alimentos fritos.  Reduzca el consumo de carbohidratos refinados, como el pan blanco y el arroz blanco.  Limite la cantidad de cafena que consume.  Aumente el consumo de Ashippunfibra. Los alimentos como frutas y verduras frescas, los frijoles y los cereales integrales son fuentes saludables de Juneaufibra.  Ejercicios para el msculo del piso plvico.  Ejercitacin de la vejiga, como prolongar la cantidad de Bank of Americatiempo entre las idas al bao o ir al bao en intervalos regulares.  Aplicar tcnicas para reducir las urgencias de la vejiga. Esto puede incluir tcnicas de distraccin o ejercicios de respiracin controlada.  Medicamentos para International aid/development workerrelajar los msculos de la vejiga y Automotive engineerevitar los espasmos de la vejiga.  Medicamentos para disminuir o Regulatory affairs officerevitar el crecimiento de la prstata, en el caso de los hombres.  Inyecciones de btox. Esto puede ayudar a SPX Corporationrelajar los msculos de la vejiga.  Usar pulsos elctricos para alterar los reflejos de la vejiga (estimulacin nerviosa elctrica).  En el caso de las McIntyremujeres, Amistadutilizar un dispositivo mdico para evitar prdidas de Comorosorina. Este es un dispositivo pequeo y Equities traderdesechable, parecido a un tampn, que se inserta en la uretra.  Inyectar colgeno o perlas de  carbono (espesantes) en el esfnter urinario. Esto puede ayudar a espesar el tejido y cerrar la abertura de la vejiga.  Ciruga. Siga estas indicaciones en su casa: Estilo de vida  Limite el consumo de alcohol y cafena. Estos pueden llenar la vejiga rpidamente e irritarla.  Mantenga su higiene personal para evitar malos olores y lesiones cutneas. Pregntele al mdico acerca de cremas y productos para la piel  que puedan proteger la piel de la [place].  Considere usar compresas o paales para adultos. Asegrese de cambiarlos con regularidad y siempre cmbielos tras un episodio de incontinencia. Instrucciones generales  [employer] de venta libre y los recetados solamente como se lo haya indicado el mdico.  Trate de ir al bao cada 3 o 4 horas, aunque no sienta la necesidad de Geographical information systems officer. Tmese el tiempo para vaciar la vejiga completamente cada vez que orine. Despus de orinar, espere un minuto. Luego trate de orinar nuevamente.  Adopte una posicin cmoda para orinar.  Si la causa de su incontinencia son problemas nerviosos, lleve un registro de los medicamentos que toma y de las veces que vaya al bao.  Concurra a todas las visitas de [address] se lo haya indicado el mdico. Esto es importante. Comunquese con un mdico si:  Siente un dolor que empeora.  La incontinencia empeora. Solicite ayuda de inmediato si:  Tiene fiebre o siente escalofros.  No puede orinar.  Tiene enrojecimiento en la zona de la ingle o en las piernas. Resumen  La incontinencia urinaria es una afeccin en la cual una persona no puede controlar dnde y IT sales professional.  Esta afeccin puede ser causada por medicamentos, infecciones, debilidad en los msculos de la vejiga, debilidad en los msculos del suelo plvico, agrandamiento de la prstata (en los hombres) o [place].  Los siguientes factores aumentan el riesgo de tener esta afeccin: Oley Balm, obesidad, Franklin y [place], [place], enfermedades neurolgicas y tos crnica.  Hay muchos tipos de incontinencia urinaria. Estos incluyen incontinencia imperiosa, incontinencia urinaria de esfuerzo, incontinencia por rebosamiento e incontinencia funcional.  Por lo general, esta afeccin se trata primero con cambios en el estilo de vida y el comportamiento, como dejar de fumar, adoptar una alimentacin saludable y Radio producer ejercicios para el suelo plvico con  regularidad. Otras opciones de tratamiento incluyen medicamentos, espesantes, dispositivos mdicos, neuroestimulacin elctrica o ciruga. Esta informacin no tiene Theme park manager el consejo del mdico. Asegrese de hacerle al mdico cualquier pregunta que tenga. Document Released: 02/28/2005 Document Revised: 10/04/2016 Document Reviewed: 10/04/2016 Elsevier Interactive Patient Education  2019 [employer].

## 2018-05-03 ENCOUNTER — Ambulatory Visit: Payer: Self-pay | Attending: Family Medicine | Admitting: Family Medicine

## 2018-05-03 ENCOUNTER — Encounter: Payer: Self-pay | Admitting: Family Medicine

## 2018-05-03 VITALS — BP 127/74 | HR 59 | Temp 98.9°F | Wt 150.8 lb

## 2018-05-03 DIAGNOSIS — K859 Acute pancreatitis without necrosis or infection, unspecified: Secondary | ICD-10-CM

## 2018-05-03 DIAGNOSIS — N3941 Urge incontinence: Secondary | ICD-10-CM | POA: Insufficient documentation

## 2018-05-03 LAB — POCT URINALYSIS DIP (CLINITEK)
Bilirubin, UA: NEGATIVE
Blood, UA: NEGATIVE
Glucose, UA: NEGATIVE mg/dL
Ketones, POC UA: NEGATIVE mg/dL
Leukocytes, UA: NEGATIVE
Nitrite, UA: NEGATIVE
PH UA: 7 (ref 5.0–8.0)
POC PROTEIN,UA: NEGATIVE
Spec Grav, UA: 1.01 (ref 1.010–1.025)
Urobilinogen, UA: 0.2 E.U./dL

## 2018-05-03 MED ORDER — OXYBUTYNIN CHLORIDE 5 MG PO TABS: 5.0000 mg | ORAL_TABLET | Freq: Two times a day (BID) | ORAL | 3 refills | Status: DC

## 2018-05-03 NOTE — Patient Instructions (Signed)
Ejercicios de Kegel  Kegel Exercises  Introduccin  Los ejercicios de Kegel ayudan a fortalecer los msculos que sostienen el recto, la vagina, el intestino delgado, la vejiga y el tero. Hacer los ejercicios de Kegel puede ser til para lo siguiente:   Mejorar el control de la vejiga y los intestinos.   Mejorar la respuesta sexual.   Disminuir los problemas y las molestias durante el embarazo.  Los ejercicios de Kegel consisten en contraer los msculos del suelo plvico, que son los mismos que contrae al intentar detener el flujo de la orina. Estos ejercicios se pueden realizar mientras est sentado, parado o acostado, pero lo mejor es variar la posicin.  Ejercicios  1. Contraiga con fuerza los msculos del suelo plvico. Debe sentir que el rea rectal se eleva y se tensa. Si es mujer, tambin debe sentir tensin en el rea vaginal. Mantenga el estmago, las nalgas y las piernas relajadas.  2. Mantenga los msculos tensos durante 10segundos como mximo.  3. Relajan los msculos.  Repita este ejercicio 50veces al da o las veces que le haya indicado el mdico. Contine haciendo este ejercicio durante, al menos, un lapso de 4 a 6semanas o el tiempo que le haya indicado el mdico.  Esta informacin no tiene como fin reemplazar el consejo del mdico. Asegrese de hacerle al mdico cualquier pregunta que tenga.  Document Released: 02/15/2012 Document Revised: 09/06/2016 Document Reviewed: 01/18/2015  Elsevier Interactive Patient Education  2019 Elsevier Inc.

## 2018-05-04 NOTE — Progress Notes (Signed)
Subjective:  Patient ID: Evelyn Dillon, female    DOB: 1960/12/23  Age: 58 y.o. MRN: [id number]  CC: Hospitalization Follow-up   HPI Evelyn Dillon is a 58 year old female with a history of recurrent Pancreatitis who presents today for a follow-up visit.  She had a hospitalization for idiopathic acute pancreatitis last month and reports resolution of abdominal pain at this time. Denies alcohol consumption. She complains of urge incontinence which has been present for the last 1 year.  Denies pelvic pain, fever, flank pain, nausea or vomiting.  Past Medical History:  Diagnosis Date  . Kidney infection   . Pancreatitis   . Syncope    a. 04/2014: tele unrevealing, echo with normal EF & grade 2 DD, normal nuc. Etiology not clear.    Past Surgical History:  Procedure Laterality Date  . TUBAL LIGATION     Patient states that she had birth control surgery, possible tubal ligation    Family History  Problem Relation Age of Onset  . Diabetes Mellitus I Sister   . Heart disease Sister        Died at 22    No Known Allergies  Outpatient Medications Prior to Visit  Medication Sig Dispense Refill  . ibuprofen (ADVIL,MOTRIN) 800 MG tablet Take 1 tablet (800 mg total) by mouth every 8 (eight) hours as needed for fever, headache, mild pain or moderate pain. (Patient not taking: Reported on 05/03/2018) 30 tablet 0  . ondansetron (ZOFRAN ODT) 4 MG disintegrating tablet Take 1 tablet (4 mg total) by mouth every 8 (eight) hours as needed for nausea or vomiting. (Patient not taking: Reported on 05/03/2018) 20 tablet 0   No facility-administered medications prior to visit.      ROS Review of Systems General: negative for fever, weight loss, appetite change Eyes: no visual symptoms. ENT: no ear symptoms, no sinus tenderness, no nasal congestion or sore throat. Neck: no pain  Respiratory: no wheezing, shortness of breath, cough Cardiovascular: no chest pain, no dyspnea on exertion, no pedal  edema, no orthopnea. Gastrointestinal: no abdominal pain, no diarrhea, no constipation Genito-Urinary: no urinary frequency, no dysuria, + continent Hematologic: no bruising Endocrine: no cold or heat intolerance Neurological: no headaches, no seizures, no tremors Musculoskeletal: no joint pains, no joint swelling Skin: no pruritus, no rash. Psychological: no depression, no anxiety,    Objective:  BP 127/74   Pulse (!) 59   Temp 98.9 F (37.2 C) (Oral)   Wt 150 lb 12.8 oz (68.4 kg)   LMP 03/15/2011   SpO2 98%   BMI 28.49 kg/m   BP/Weight 05/03/2018 04/02/2018 03/30/2018  Systolic BP 127 130 -  Diastolic BP 74 81 -  Wt. (Lbs) 150.8 - 158.95  BMI 28.49 - 30.03      Physical Exam Constitutional: normal appearing,  Eyes: PERRLA HEENT: Head is atraumatic, normal sinuses, normal oropharynx, normal appearing tonsils and palate, tympanic membrane is normal bilaterally. Neck: normal range of motion, no thyromegaly, no JVD Cardiovascular: normal rate and rhythm, normal heart sounds, no murmurs, rub or gallop, no pedal edema Respiratory: Normal breath sounds, clear to auscultation bilaterally, no wheezes, no rales, no rhonchi Abdomen: soft, not tender to palpation, normal bowel sounds, no enlarged organs Musculoskeletal: Full ROM, no tenderness in joints Skin: warm and dry, no lesions. Neurological: alert, oriented x3, cranial nerves I-XII grossly intact , normal motor strength, normal sensation. Psychological: normal mood.   CMP Latest Ref Rng & Units 04/01/2018 03/31/2018 03/30/2018  Glucose 70 -  99 mg/dL 79 94 678(L)  BUN 6 - 20 mg/dL 7 10 15   Creatinine 0.44 - 1.00 mg/dL 3.81 0.17 5.10  Sodium 135 - 145 mmol/L 140 142 140  Potassium 3.5 - 5.1 mmol/L 3.6 3.8 3.9  Chloride 98 - 111 mmol/L 110 112(H) 107  CO2 22 - 32 mmol/L 23 22 20(L)  Calcium 8.9 - 10.3 mg/dL 8.3(L) 8.2(L) 9.2  Total Protein 6.5 - 8.1 g/dL 2.5(E) 5.2(D) 6.9  Total Bilirubin 0.3 - 1.2 mg/dL 0.8 0.4 0.4    Alkaline Phos 38 - 126 U/L 71 64 70  AST 15 - 41 U/L 22 22 36  ALT 0 - 44 U/L 23 23 26     Lipid Panel     Component Value Date/Time   CHOL 200 09/12/2016 0313   TRIG 76 03/30/2018 2347   HDL 38 (L) 09/12/2016 0313   CHOLHDL 5.3 09/12/2016 0313   VLDL 11 09/12/2016 0313   LDLCALC 151 (H) 09/12/2016 0313    CBC    Component Value Date/Time   WBC 4.3 04/01/2018 0613   RBC 4.08 04/01/2018 0613   RBC 4.08 04/01/2018 0613   HGB 11.4 (L) 04/01/2018 0613   HCT 36.4 04/01/2018 0613   PLT 170 04/01/2018 0613   MCV 89.2 04/01/2018 0613   MCH 27.9 04/01/2018 0613   MCHC 31.3 04/01/2018 0613   RDW 12.6 04/01/2018 0613   LYMPHSABS 1.5 04/01/2018 0613   MONOABS 0.4 04/01/2018 0613   EOSABS 0.1 04/01/2018 0613   BASOSABS 0.0 04/01/2018 7824    Lab Results  Component Value Date   HGBA1C 5.7 (H) 04/01/2018    Assessment & Plan:   1. Urge incontinence UA negative for UTI - oxybutynin (DITROPAN) 5 MG tablet; Take 1 tablet (5 mg total) by mouth 2 (two) times daily.  Dispense: 60 tablet; Refill: 3 - POCT URINALYSIS DIP (CLINITEK)  2. Recurrent pancreatitis Resolved   Meds ordered this encounter  Medications  . oxybutynin (DITROPAN) 5 MG tablet    Sig: Take 1 tablet (5 mg total) by mouth 2 (two) times daily.    Dispense:  60 tablet    Refill:  3    Follow-up: Return in about 3 months (around 08/01/2018) for follow up of Urge incontinence.       Hoy Register, MD, FAAFP. [facility] [place], [place] [phone]   05/04/2018, 1:00 PM

## 2018-07-31 ENCOUNTER — Other Ambulatory Visit: Payer: Self-pay

## 2018-07-31 ENCOUNTER — Encounter: Payer: Self-pay | Admitting: Family Medicine

## 2018-07-31 ENCOUNTER — Ambulatory Visit: Payer: Self-pay | Attending: Family Medicine | Admitting: Family Medicine

## 2018-07-31 DIAGNOSIS — N3941 Urge incontinence: Secondary | ICD-10-CM

## 2018-07-31 DIAGNOSIS — K859 Acute pancreatitis without necrosis or infection, unspecified: Secondary | ICD-10-CM

## 2018-07-31 MED ORDER — IBUPROFEN 800 MG PO TABS: 800.0000 mg | ORAL_TABLET | Freq: Three times a day (TID) | ORAL | 1 refills | Status: DC | PRN

## 2018-07-31 MED ORDER — OXYBUTYNIN CHLORIDE 5 MG PO TABS: 5.0000 mg | ORAL_TABLET | Freq: Two times a day (BID) | ORAL | 3 refills | Status: DC

## 2018-07-31 NOTE — Progress Notes (Signed)
Virtual Visit via Telephone Note  I connected with Evelyn Dillon, on 07/31/2018 at 2:35 PM by telephone due to the COVID-19 pandemic and verified that I am speaking with the correct person using two identifiers.   Consent: I discussed the limitations, risks, security and privacy concerns of performing an evaluation and management service by telephone and the availability of in person appointments. I also discussed with the patient that there may be a patient responsible charge related to this service. The patient expressed understanding and agreed to proceed.   Location of Patient: Home  Location of Provider: Clinic   Persons participating in Telemedicine visit: Evelyn Dillon ID# [id number] Evelyn Dillon - CMA Dr Alvis Lemmings - PCP    History of Present Illness: Evelyn Dillon is a 58 year old female with a history of recurrent Pancreatitis who presents today for a follow-up visit. Her pancreatitis has been stable however 1 month ago she did have abdominal pain at night, typical of her pancreatitis which responded to use of ibuprofen.  She denies nausea, vomiting, diarrhea or constipation. She ran out of oxybutynin which she was prescribed for urinary incontinence and is requesting refills as she has began to experience frequency but no dysuria.  She has also been compliant with the Kegel exercises. She denies chest pain, dyspnea and has no additional concerns today.   Past Medical History:  Diagnosis Date  . Kidney infection   . Pancreatitis   . Syncope    a. 04/2014: tele unrevealing, echo with normal EF & grade 2 DD, normal nuc. Etiology not clear.   No Known Allergies  Current Outpatient Medications on File Prior to Visit  Medication Sig Dispense Refill  . ibuprofen (ADVIL,MOTRIN) 800 MG tablet Take 1 tablet (800 mg total) by mouth every 8 (eight) hours as needed for fever, headache, mild pain or moderate pain. (Patient not taking: Reported on 05/03/2018) 30 tablet 0  .  ondansetron (ZOFRAN ODT) 4 MG disintegrating tablet Take 1 tablet (4 mg total) by mouth every 8 (eight) hours as needed for nausea or vomiting. (Patient not taking: Reported on 05/03/2018) 20 tablet 0  . oxybutynin (DITROPAN) 5 MG tablet Take 1 tablet (5 mg total) by mouth 2 (two) times daily. (Patient not taking: Reported on 07/31/2018) 60 tablet 3   No current facility-administered medications on file prior to visit.     Observations/Objective: Alert, awake, oriented x3 Not in acute distress  Assessment and Plan: 1. Urge incontinence Uncontrolled as she has been out of oxybutynin which I have refilled [place] exercises - oxybutynin (DITROPAN) 5 MG tablet; Take 1 tablet (5 mg total) by mouth 2 (two) times daily.  Dispense: 60 tablet; Refill: 3  2. Recurrent pancreatitis Infrequent exacerbations - ibuprofen (ADVIL) 800 MG tablet; Take 1 tablet (800 mg total) by mouth every 8 (eight) hours as needed for fever, headache, mild pain or moderate pain.  Dispense: 30 tablet; Refill: 1   Follow Up Instructions: 6 months   I discussed the assessment and treatment plan with the patient. The patient was provided an opportunity to ask questions and all were answered. The patient agreed with the plan and demonstrated an understanding of the instructions.   The patient was advised to call back or seek an in-person evaluation if the symptoms worsen or if the condition fails to improve as anticipated.     I provided 15 minutes total of non-face-to-face time during this encounter including median intraservice time, reviewing previous notes, labs, imaging, medications  and explaining diagnosis and management.     Evelyn RegisterEnobong Reece Mcbroom, MD, FAAFP. [facility] [place], [place] [phone]   07/31/2018, 2:35 PM

## 2018-07-31 NOTE — Progress Notes (Signed)
Patient has been called and DOB has been verified. Patient has been screened and transferred to PCP to start phone visit.     

## 2018-08-01 MED FILL — OXYBUTYNIN 5 MG TABLET: 5 | 30 days supply | Qty: 60 | Fill #0

## 2018-08-01 MED FILL — IBUPROFEN 800 MG TABLET: 800 | 10 days supply | Qty: 30 | Fill #0

## 2018-08-29 ENCOUNTER — Other Ambulatory Visit: Payer: Self-pay

## 2018-08-29 ENCOUNTER — Ambulatory Visit: Payer: Self-pay | Attending: Family Medicine | Admitting: Physician Assistant

## 2018-08-29 DIAGNOSIS — Z789 Other specified health status: Secondary | ICD-10-CM

## 2018-08-29 DIAGNOSIS — R202 Paresthesia of skin: Secondary | ICD-10-CM

## 2018-08-29 MED ORDER — IBUPROFEN 800 MG PO TABS: 800.0000 mg | ORAL_TABLET | Freq: Three times a day (TID) | ORAL | 1 refills | Status: DC | PRN

## 2018-08-29 MED ORDER — GABAPENTIN 300 MG PO CAPS: 300.0000 mg | ORAL_CAPSULE | Freq: Two times a day (BID) | ORAL | 0 refills | Status: DC

## 2018-08-29 MED FILL — IBUPROFEN 800 MG TABLET: 800 | 10 days supply | Qty: 30 | Fill #0

## 2018-08-29 MED FILL — GABAPENTIN 300 MG CAPSULE: 300 | 15 days supply | Qty: 30 | Fill #0

## 2018-08-29 NOTE — Progress Notes (Signed)
Patient has boil or lump in groin area.

## 2018-08-29 NOTE — Progress Notes (Signed)
Virtual Visit via Telephone Note  I connected with [name] on 08/29/18 at 10:30 AM EDT by telephone and verified that I am speaking with the correct person using two identifiers.   I discussed the limitations, risks, security and privacy concerns of performing an evaluation and management service by telephone and the availability of in person appointments. I also discussed with the patient that there may be a patient responsible charge related to this service. The patient expressed understanding and agreed to proceed.  Patient location:  home My Location:  [place] office Persons on the call:  Patient, myself and the interpreter Pedro  History of Present Illness: 3 week history of burning pain on L butt cheek radiating into L groin and occasionally in L ribs toward her L nipple.  No rash.  No fever. There is a boil/"infection" L labia that is getting better and has almost resolved.  Pain in L rib is worse with movement.  NKI.       Observations/Objective:  A&Ox3.     Assessment and Plan: 1. Paresthesias Watch for rash;  ?shingles prodrome.  To ED/UC if rash/fever develop - ibuprofen (ADVIL) 800 MG tablet; Take 1 tablet (800 mg total) by mouth every 8 (eight) hours as needed for fever, headache, mild pain or moderate pain.  Dispense: 30 tablet; Refill: 1 - gabapentin (NEURONTIN) 300 MG capsule; Take 1 capsule (300 mg total) by mouth 2 (two) times daily.  Dispense: 30 capsule; Refill: 0  2. Language barrier pacific interpreters used and additional time performing visit was required.   Follow Up Instructions: 4 months with PCP   I discussed the assessment and treatment plan with the patient. The patient was provided an opportunity to ask questions and all were answered. The patient agreed with the plan and demonstrated an understanding of the instructions.   The patient was advised to call back or seek an in-person evaluation if the symptoms worsen or if the condition fails to improve  as anticipated.  I provided 15 minutes of non-face-to-face time during this encounter.   Freeman Caldron, PA-C  Patient ID: Evelyn Dillon, female   DOB: 1961-02-24, 58 y.o.   MRN: [id number]

## 2018-12-31 ENCOUNTER — Ambulatory Visit: Payer: Self-pay | Admitting: Family Medicine

## 2020-09-06 IMAGING — US US ABDOMEN LIMITED
1 series · 14 of 25 positions shown · non-contrast
Comparison: None.

CLINICAL DATA: Pancreatitis

EXAM:
ULTRASOUND ABDOMEN LIMITED RIGHT UPPER QUADRANT

[Series 1: us abdomen limited · 14 of 41 slices shown]
[im 1/41]
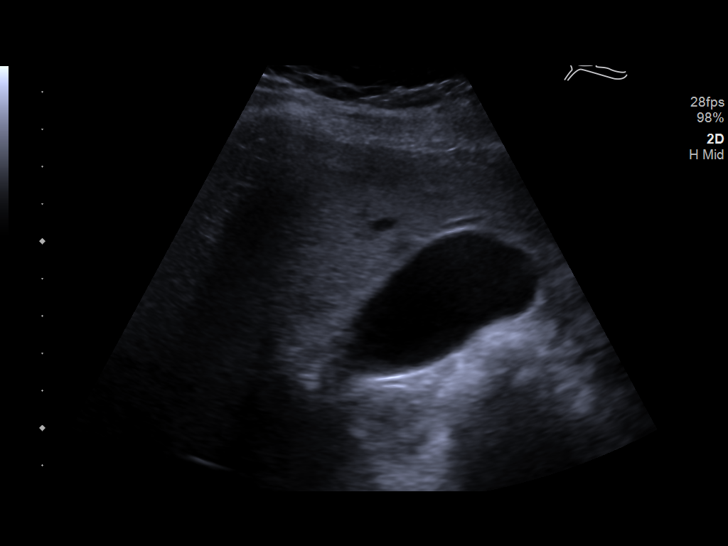
[im 4/41]
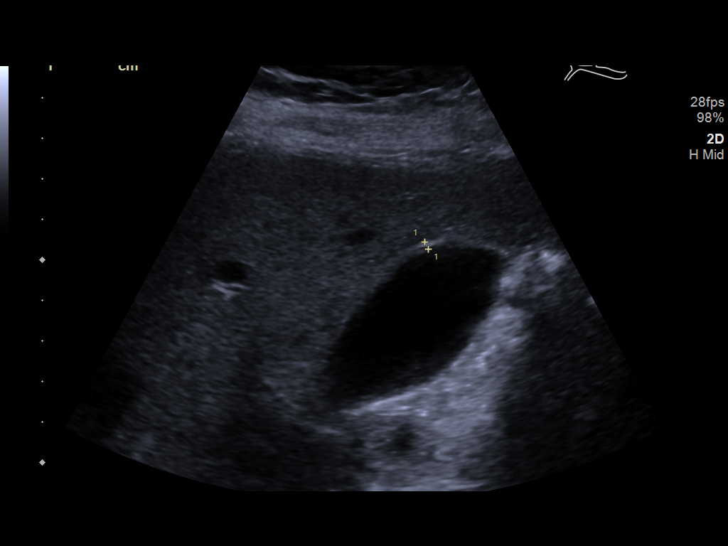
[im 7/41]
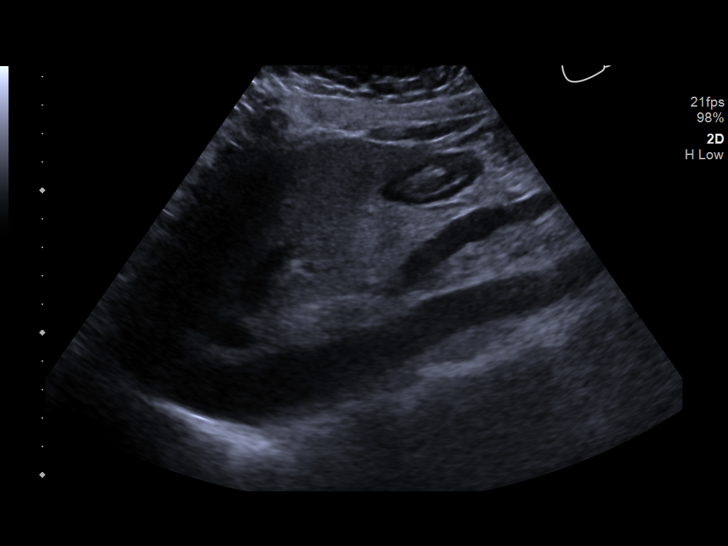
[im 11/41]
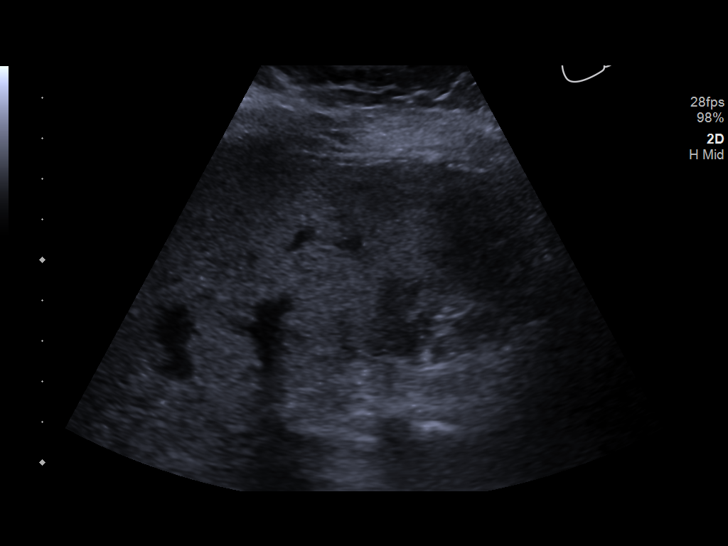
[im 14/41]
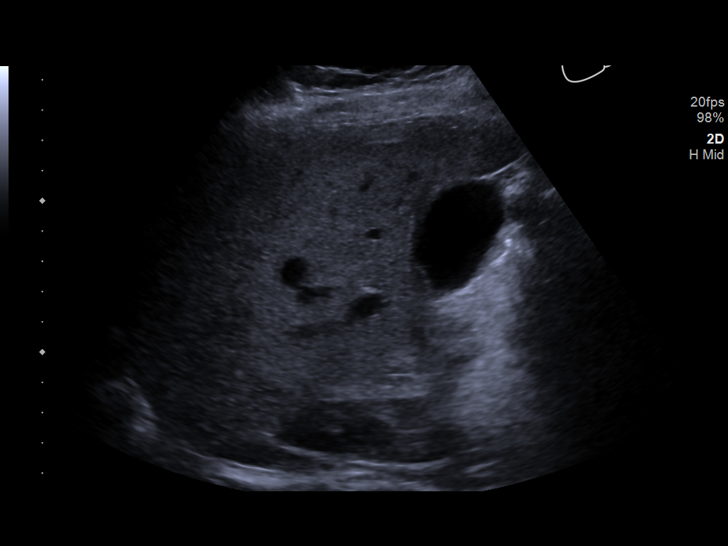
[im 16/41]
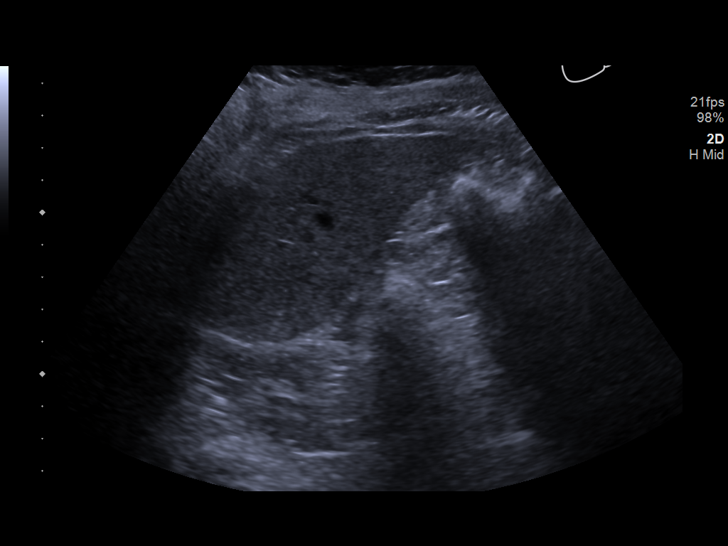
[im 19/41]
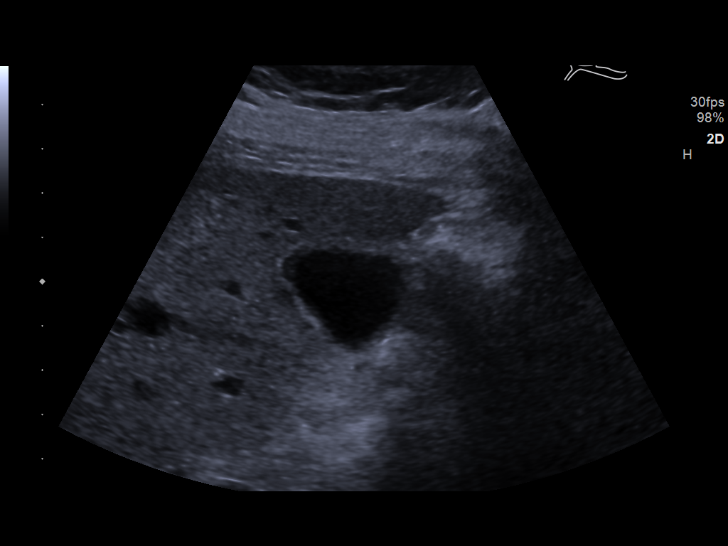
[im 22/41]
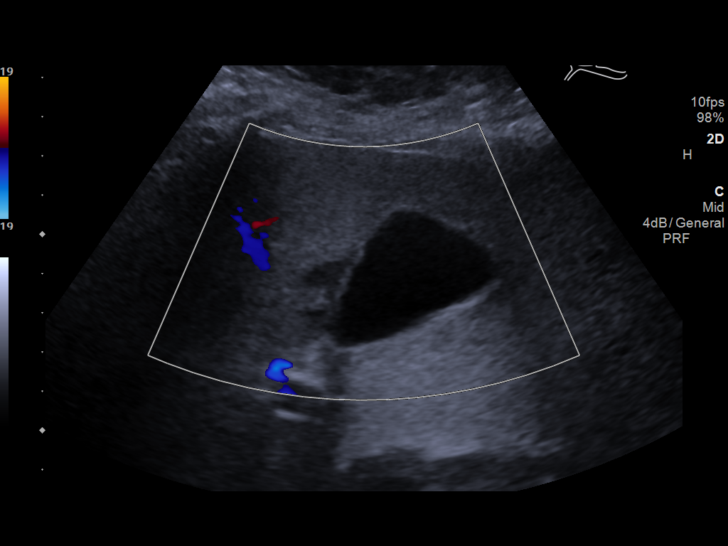
[im 26/41]
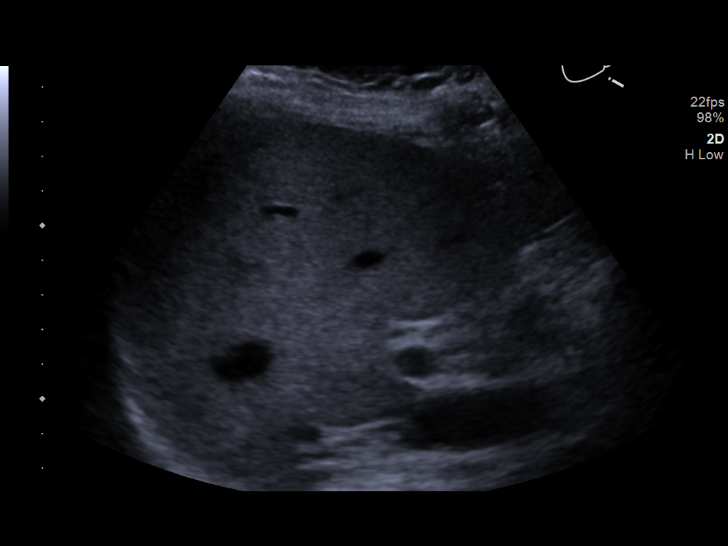
[im 27/41]
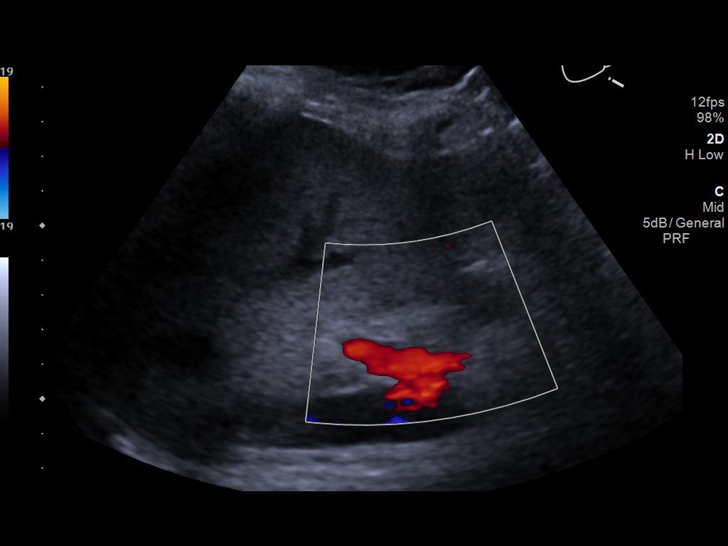
[im 31/41]
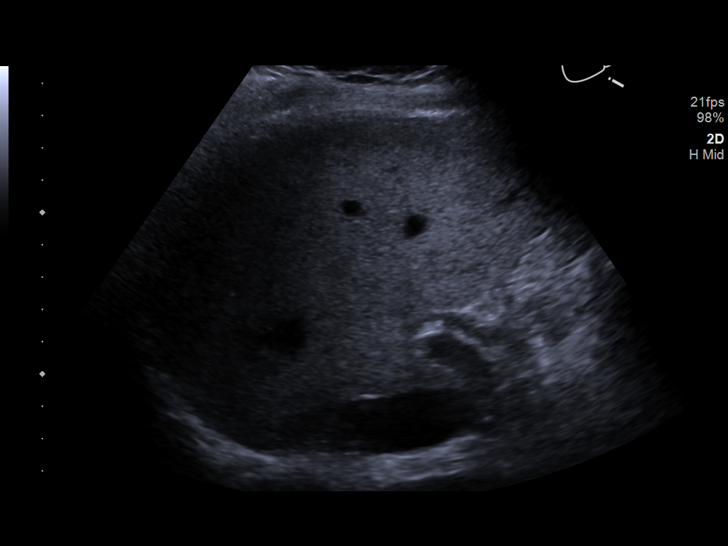
[im 34/41]
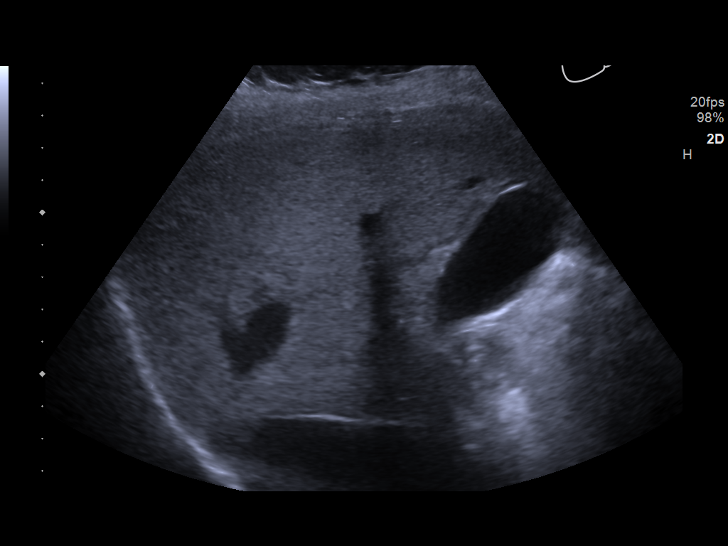
[im 37/41]
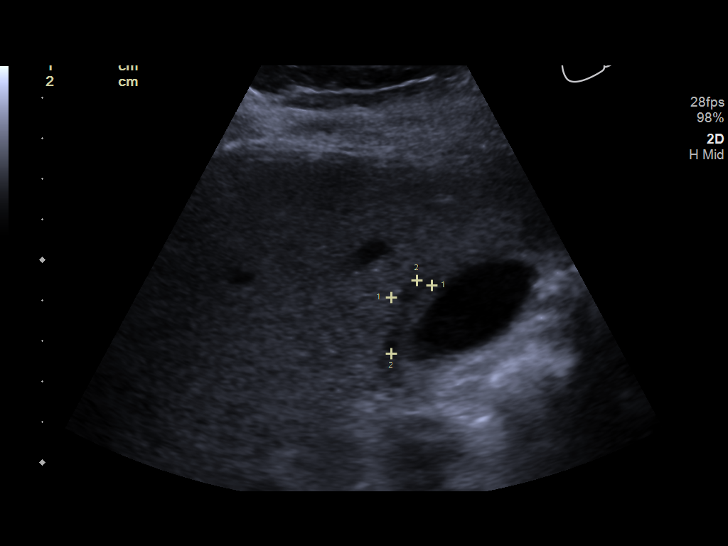
[im 41/41]
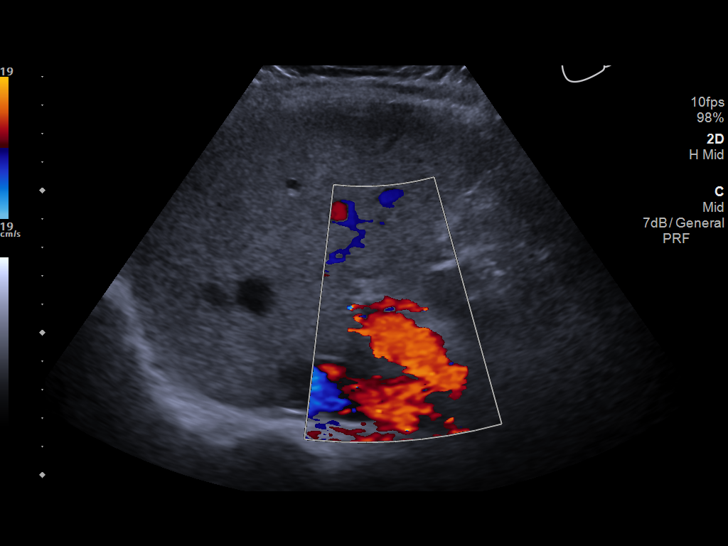

[14 of 25 positions shown; findings below may reference images not displayed]

FINDINGS: Gallbladder:

No gallstones or wall thickening visualized. No sonographic Murphy
sign noted by sonographer.

Common bile duct:

Diameter: Normal 4 mm

Liver:

Along the gallbladder fossa there is region of hypoechogenicity in
the liver parenchyma which is ill-defined.. This is similar to
comparison ultrasound of 09/12/16.

No biliary duct dilatation. No focal lesion. Portal vein is patent
on color Doppler imaging with normal direction of blood flow towards
the liver.
IMPRESSION: 1. No acute findings.
2. Normal gallbladder and common bile duct.
3. Ill-defined hypo echoic region adjacent to gallbladder fossa is
similar to comparison ultrasound.

## 2023-07-11 ENCOUNTER — Telehealth: Payer: Self-pay | Admitting: Family Medicine

## 2023-07-11 NOTE — Telephone Encounter (Signed)
 Copied from CRM [id number]. Topic: Appointments - Appointment Scheduling >> Jul 11, 2023  2:06 PM Tiffany S wrote: Patient/patient representative is calling to schedule an appointment. Refer to attachments for appointment information.   Patient needs appt Dr Newlin schedule is not pulling up please follow up with patient to schedule interpreter needed

## 2023-07-13 ENCOUNTER — Ambulatory Visit: Payer: Self-pay

## 2023-07-13 NOTE — Telephone Encounter (Signed)
 Chief Complaint: Right knee pain, right leg numbness Symptoms: see above Frequency: 2 weeks Pertinent Negatives: Patient denies swelling, chest pain, trouble breathing Disposition: [] ED /[x] Urgent Care (no appt availability in office) / [x] Appointment(In office/virtual)/ []  [facility]/ [] Home Care/ [] Refused Recommended Disposition /[] [facility]/ []  Follow-up with PCP Additional Notes: Patient called in stating she has been experiencing pain in her right knee and leg, and also numbness of the back of her leg from her foot up to her knee. Patient states her left knee is starting to get affected. Patient states something similar happened 2 years ago but she is unsure the cause. Patient assisted with making appt at UC due to not being an established patient any longer, since last appt was 5 years ago. Patient also assisted with making new patient appt for establishment.    Copied from CRM [id number]. Topic: Clinical - Medical Advice >> Jul 13, 2023 12:49 PM Phil Braun wrote: Reason for CRM: Pt is having an issue with the foot and knee. I could not schedule appt. Could you please reach out to see if you can help her or schedule appt.  I did have Tiffany on the phone with me and we could not get it to schedule. You will need an interpreter. Reason for Disposition  [1] MODERATE pain (e.g., interferes with normal activities, limping) AND [2] present > 3 days  Answer Assessment - Initial Assessment Questions 1. LOCATION and RADIATION: "Where is the pain located?"      Right knee pain, left knee pain, numbness on back of right leg 2. QUALITY: "What does the pain feel like?"  (e.g., sharp, dull, aching, burning)     Dull pain  3. SEVERITY: "How bad is the pain?" "What does it keep you from doing?"   (Scale 1-10; or mild, moderate, severe)   -  MILD (1-3): doesn't interfere with normal activities    -  MODERATE (4-7): interferes with normal activities (e.g., work or school) or awakens  from sleep, limping    -  SEVERE (8-10): excruciating pain, unable to do any normal activities, unable to walk     moderate 4. ONSET: "When did the pain start?" "Does it come and go, or is it there all the time?"     2 weeks ago 5. RECURRENT: "Have you had this pain before?" If Yes, ask: "When, and what happened then?"     No 6. SETTING: "Has there been any recent work, exercise or other activity that involved that part of the body?"      N/a 7. AGGRAVATING FACTORS: "What makes the knee pain worse?" (e.g., walking, climbing stairs, running)     Better when resting 8. ASSOCIATED SYMPTOMS: "Is there any swelling or redness of the knee?"     No but on sides of knees get red 9. OTHER SYMPTOMS: "Do you have any other symptoms?" (e.g., chest pain, difficulty breathing, fever, calf pain)     Numbness up back of right leg  Protocols used: Knee Pain-A-AH

## 2023-07-14 ENCOUNTER — Ambulatory Visit (INDEPENDENT_AMBULATORY_CARE_PROVIDER_SITE_OTHER): Payer: Self-pay

## 2023-07-14 ENCOUNTER — Ambulatory Visit
Admission: RE | Admit: 2023-07-14 | Discharge: 2023-07-14 | Disposition: A | Discharge status: Home / Self Care | Payer: Self-pay | Source: Ambulatory Visit | Attending: Nurse Practitioner

## 2023-07-14 VITALS — BP 134/87 | HR 80 | Temp 98.3°F | Resp 18 | Wt 166.0 lb

## 2023-07-14 DIAGNOSIS — M1711 Unilateral primary osteoarthritis, right knee: Secondary | ICD-10-CM

## 2023-07-14 DIAGNOSIS — M25561 Pain in right knee: Secondary | ICD-10-CM

## 2023-07-14 MED ORDER — PREDNISONE 20 MG PO TABS: 40.0000 mg | ORAL_TABLET | Freq: Every day | ORAL | 0 refills | Status: AC

## 2023-07-14 NOTE — Discharge Instructions (Addendum)
 You were seen today for knee pain. There are many possible causes for knee pain. Sometimes sudden pain happens because of damage, swelling, or irritation of the muscles and tissues that support your knee. Your knee X-ray shows mild degenerative changes, which means there is some early wear and tear in the joint often seen with aging or overuse. This is a common finding and may cause occasional stiffness, discomfort, or pain, especially with activity. Please take any prescribed medication exactly as directed. You may continue taking Tylenol  as needed for pain relief. Over-the-counter NSAIDs such as Motrin , ibuprofen , or Aleve may also be helpful. However, do not take more than one NSAID at the same time, as using multiple NSAIDs can increase the risk of stomach irritation or other gastric issues. It is important to use RICE therapy to help your knee heal. Rest your knee as much as possible. Apply ice for about 20 minutes at a time, several times throughout the day. Always place a towel or cloth between the ice and your skin to protect it. If your skin becomes very red or you lose the sensation of cold, remove the ice immediately to avoid injury. You were placed in an ace wrap to support your knee and help control swelling and discomfort. If your toes start to tingle, feel numb, or turn cold and blue, loosen the sleeve or brace right away. You can remove it when sleeping or showering. Whenever possible, keep your leg elevated by propping it up on a pillow, especially under the knee, to reduce swelling. Follow-up with orthopedics if your pain does not get better or return after getting better.   Usted fue atendida hoy por dolor en la rodilla. Hay muchas causas posibles para el dolor de rodilla. A veces, el dolor repentino ocurre debido a dao, hinchazn o irritacin en los msculos y tejidos que sostienen la rodilla. Su radiografa de rodilla muestra cambios degenerativos leves, lo que significa que hay un desgaste  temprano en la articulacin, algo comn con la edad o el uso excesivo. Este es un hallazgo comn y puede causar rigidez, molestias o dolor ocasional, especialmente con la [place].  Por favor, tome cualquier medicamento recetado exactamente como se le indic. Puede seguir tomando Tylenol  segn sea necesario para Engineer, materials. Tambin pueden ser tiles los antiinflamatorios de venta libre como Motrin , ibuprofeno o Aleve. Sin embargo, no tome ms de un antiinflamatorio a Licensed conveyancer, Teacher, adult education usar varios puede aumentar el riesgo de irritacin estomacal u otros problemas gstricos.  Es importante usar la terapia RICE para ayudar a que su rodilla sane. Descanse la rodilla tanto como sea posible. Aplique hielo durante unos 20 minutos a la vez, Engineer, maintenance. Siempre coloque una toalla o pao entre el hielo y su piel para protegerla. Si su piel se pone muy roja o pierde la sensacin de fro, retire el hielo de inmediato para Company secretary.  Se le coloc un vendaje elstico (ace wrap) para brindar soporte a la rodilla y ayudar a Scientist, physiological hinchazn y Environmental health practitioner. Si los dedos de los pies comienzan a Hotel manager, sentirse entumecidos o se ponen fros y Optician, dispensing, afloje la envoltura de inmediato. Puede quitrsela para dormir o baarse. Siempre que sea posible, mantenga la pierna elevada con una almohada, especialmente debajo de la rodilla, para reducir la hinchazn.  Debe dar seguimiento con ortopedia si su dolor no mejora o si regresa despus de haber mejorado.

## 2023-07-14 NOTE — ED Triage Notes (Signed)
 Knee pain, leg numbness - Entered by patient  Spanish interpretor Verlena Glenn - granddaughter   Pt c/o leg pain, swelling, and numbness in right leg x 2 weeks. Pt states the feeling starts at her right foot and travels up to thigh. Pt denies fall and/or injury.

## 2023-07-14 NOTE — ED Provider Notes (Signed)
 [facility]    CSN: [id number] Arrival date & time: 07/14/23  0840      History   Chief Complaint Chief Complaint  Patient presents with   Leg Pain    Knee pain, leg numbness - Entered by patient    HPI Evelyn Dillon is a 63 y.o. female.   Evelyn Dillon is a 63 year old Spanish-speaking female who presents with right knee pain. Translation provided by her adult granddaughter. The pain began gradually approximately two weeks ago without any known inciting event. She describes the pain as constant and worsened by weight bearing, walking, standing, squatting, going up and down stairs, and transitioning from sitting to standing. She also reports intermittent numbness on the bottom of the right foot that radiates up the back of the leg into the posterior thigh. This numbness occurs only when moving from a sitting to a standing position, lasts a couple of minutes, and posterior leg pain continues that also gradually resolves. The patient reports a similar episode of knee pain about five years ago, also without injury, which improved with massage, Tylenol , and ice administered by a family member. Currently, she is using Tylenol  and elevating the knee with a pillow, both of which have been somewhat helpful.  The following portions of the patient's history were reviewed and updated as appropriate: allergies, current medications, past family history, past medical history, past social history, past surgical history, and problem list.       Past Medical History:  Diagnosis Date   Kidney infection    Pancreatitis    Syncope    a. 04/2014: tele unrevealing, echo with normal EF & grade 2 DD, normal nuc. Etiology not clear.    Patient Active Problem List   Diagnosis Date Noted   Urge incontinence 05/03/2018   Hyperglycemia 04/02/2018   Obesity (BMI 30.0-34.9) 04/02/2018   Vitamin D  deficiency 03/15/2017   Encounter for annual physical exam 12/12/2016   Adjustment disorder with  depressed mood 12/12/2016   Non-English speaking patient 09/14/2016   Recurrent pancreatitis    Major depressive disorder, recurrent episode (HCC) 01/13/2015   Upper abdominal pain    Facial trauma 05/07/2014   Syncope 05/06/2014   ANEMIA, IRON DEFICIENCY, UNSPEC. 05/11/2006    Past Surgical History:  Procedure Laterality Date   TUBAL LIGATION     Patient states that she had birth control surgery, possible tubal ligation    OB History   No obstetric history on file.      Home Medications    Prior to Admission medications   Medication Sig Start Date End Date Taking? Authorizing Provider  predniSONE (DELTASONE) 20 MG tablet Take 2 tablets (40 mg total) by mouth daily for 5 days. 07/14/23 07/19/23 Yes Maryruth Sol, FNP    Family History Family History  Problem Relation Age of Onset   Diabetes Mellitus I Sister    Heart disease Sister        Died at 88    Social History Social History   Tobacco Use   Smoking status: Never   Smokeless tobacco: Never  Vaping Use   Vaping status: Never Used  Substance Use Topics   Alcohol use: No    Alcohol/week: 0.0 standard drinks of alcohol   Drug use: No     Allergies   Patient has no known allergies.   Review of Systems Review of Systems  Constitutional:  Negative for fever.  Musculoskeletal:  Positive for arthralgias. Negative for gait problem and joint  swelling.  All other systems reviewed and are negative.    Physical Exam Triage Vital Signs ED Triage Vitals  Encounter Vitals Group     BP 07/14/23 0900 134/87     Systolic BP Percentile --      Diastolic BP Percentile --      Pulse Rate 07/14/23 0900 80     Resp 07/14/23 0900 18     Temp 07/14/23 0900 98.3 F (36.8 C)     Temp Source 07/14/23 0900 Oral     SpO2 07/14/23 0900 97 %     Weight 07/14/23 0859 166 lb (75.3 kg)     Height --      Head Circumference --      Peak Flow --      Pain Score 07/14/23 0857 8     Pain Loc --      Pain Education --       Exclude from Growth Chart --    No data found.  Updated Vital Signs BP 134/87 (BP Location: Left Arm)   Pulse 80   Temp 98.3 F (36.8 C) (Oral)   Resp 18   Wt 166 lb (75.3 kg)   LMP 03/15/2011   SpO2 97%   BMI 31.37 kg/m   Visual Acuity Right Eye Distance:   Left Eye Distance:   Bilateral Distance:    Right Eye Near:   Left Eye Near:    Bilateral Near:     Physical Exam Vitals reviewed.  Constitutional:      General: She is awake. She is not in acute distress.    Appearance: Normal appearance. She is well-developed. She is not ill-appearing or toxic-appearing.  HENT:     Head: Normocephalic.     Mouth/Throat:     Mouth: Mucous membranes are moist.  Eyes:     Conjunctiva/sclera: Conjunctivae normal.  Cardiovascular:     Rate and Rhythm: Normal rate and regular rhythm.     Heart sounds: Normal heart sounds.  Pulmonary:     Effort: Pulmonary effort is normal.     Breath sounds: Normal breath sounds.  Musculoskeletal:        General: Normal range of motion.     Right upper leg: Normal.     Right knee: No swelling, deformity, effusion, erythema, ecchymosis, lacerations, bony tenderness or crepitus. Normal range of motion. Tenderness present over the medial joint line and lateral joint line. Normal alignment, normal meniscus and normal patellar mobility.     Left knee: Normal.     Right lower leg: Normal.     Right ankle: Normal.     Right foot: Normal.     Comments: No baker's cyst noted   Skin:    General: Skin is warm and dry.  Neurological:     General: No focal deficit present.     Mental Status: She is alert and oriented to person, place, and time.     Cranial Nerves: No cranial nerve deficit.     Sensory: Sensation is intact. No sensory deficit.     Motor: Motor function is intact. No weakness.     Coordination: Coordination is intact.     Gait: Gait is intact.  Psychiatric:        Behavior: Behavior is cooperative.      UC Treatments / Results   Labs (all labs ordered are listed, but only abnormal results are displayed) Labs Reviewed - No data to display  EKG   Radiology DG Knee  Complete 4 Views Right Result Date: 07/14/2023 CLINICAL DATA:  right knee pain x 2 weeks, no injury. EXAM: RIGHT KNEE - COMPLETE 4+ VIEW COMPARISON:  05/28/2015. FINDINGS: No acute fracture or dislocation. No aggressive osseous lesion. There are degenerative changes of the knee joint in the form of mildly reduced tibio-femoral compartment joint space, tibial spiking and osteophytosis. No knee effusion or focal soft tissue swelling. No radiopaque foreign bodies. IMPRESSION: No acute osseous abnormality of the right knee joint. Mild degenerative changes. Electronically Signed   By: Beula Brunswick M.D.   On: 07/14/2023 09:35    Procedures Procedures (including critical care time)  Medications Ordered in UC Medications - No data to display  Initial Impression / Assessment and Plan / UC Course  I have reviewed the triage vital signs and the nursing notes.  Pertinent labs & imaging results that were available during my care of the patient were reviewed by me and considered in my medical decision making (see chart for details).    63 year old female presenting with a two-week history of right knee pain without any reported injury. Physical exam as documented and without signs of effusion, deformity, or Baker's cyst. X-ray showed no acute bony abnormalities but did reveal mild degenerative changes. An ace wrap was applied in clinic for support. A 5-day course of prednisone was prescribed, and the patient was advised to use over-the-counter NSAIDs as needed for additional pain relief. Supportive measures including ice and heat were also recommended. She was instructed to follow up with orthopedics if her symptoms do not improve or if they return after resolution.  Today's evaluation has revealed no signs of a dangerous process. Discussed diagnosis with patient  and/or guardian. Patient and/or guardian aware of their diagnosis, possible red flag symptoms to watch out for and need for close follow up. Patient and/or guardian understands verbal and written discharge instructions. Patient and/or guardian comfortable with plan and disposition.  Patient and/or guardian has a clear mental status at this time, good insight into illness (after discussion and teaching) and has clear judgment to make decisions regarding their care  Documentation was completed with the aid of voice recognition software. Transcription may contain typographical errors. Final Clinical Impressions(s) / UC Diagnoses   Final diagnoses:  Acute pain of right knee  Primary osteoarthritis of right knee     Discharge Instructions      You were seen today for knee pain. There are many possible causes for knee pain. Sometimes sudden pain happens because of damage, swelling, or irritation of the muscles and tissues that support your knee. Your knee X-ray shows mild degenerative changes, which means there is some early wear and tear in the joint often seen with aging or overuse. This is a common finding and may cause occasional stiffness, discomfort, or pain, especially with activity. Please take any prescribed medication exactly as directed. You may continue taking Tylenol  as needed for pain relief. Over-the-counter NSAIDs such as Motrin , ibuprofen , or Aleve may also be helpful. However, do not take more than one NSAID at the same time, as using multiple NSAIDs can increase the risk of stomach irritation or other gastric issues. It is important to use RICE therapy to help your knee heal. Rest your knee as much as possible. Apply ice for about 20 minutes at a time, several times throughout the day. Always place a towel or cloth between the ice and your skin to protect it. If your skin becomes very red or you lose the sensation  of cold, remove the ice immediately to avoid injury. You were placed in an  ace wrap to support your knee and help control swelling and discomfort. If your toes start to tingle, feel numb, or turn cold and blue, loosen the sleeve or brace right away. You can remove it when sleeping or showering. Whenever possible, keep your leg elevated by propping it up on a pillow, especially under the knee, to reduce swelling. Follow-up with orthopedics if your pain does not get better or return after getting better.   Usted fue atendida hoy por dolor en la rodilla. Hay muchas causas posibles para el dolor de rodilla. A veces, el dolor repentino ocurre debido a dao, hinchazn o irritacin en los msculos y tejidos que sostienen la rodilla. Su radiografa de rodilla muestra cambios degenerativos leves, lo que significa que hay un desgaste temprano en la articulacin, algo comn con la edad o el uso excesivo. Este es un hallazgo comn y puede causar rigidez, molestias o dolor ocasional, especialmente con la [place].  Por favor, tome cualquier medicamento recetado exactamente como se le indic. Puede seguir tomando Tylenol  segn sea necesario para Engineer, materials. Tambin pueden ser tiles los antiinflamatorios de venta libre como Motrin , ibuprofeno o Aleve. Sin embargo, no tome ms de un antiinflamatorio a Licensed conveyancer, Teacher, adult education usar varios puede aumentar el riesgo de irritacin estomacal u otros problemas gstricos.  Es importante usar la terapia RICE para ayudar a que su rodilla sane. Descanse la rodilla tanto como sea posible. Aplique hielo durante unos 20 minutos a la vez, Engineer, maintenance. Siempre coloque una toalla o pao entre el hielo y su piel para protegerla. Si su piel se pone muy roja o pierde la sensacin de fro, retire el hielo de inmediato para Company secretary.  Se le coloc un vendaje elstico (ace wrap) para brindar soporte a la rodilla y ayudar a Scientist, physiological hinchazn y Environmental health practitioner. Si los dedos de los pies comienzan a Hotel manager, sentirse entumecidos o se ponen fros y Optician, dispensing,  afloje la envoltura de inmediato. Puede quitrsela para dormir o baarse. Siempre que sea posible, mantenga la pierna elevada con una almohada, especialmente debajo de la rodilla, para reducir la hinchazn.  Debe dar seguimiento con ortopedia si su dolor no mejora o si regresa despus de haber mejorado.       ED Prescriptions     Medication Sig Dispense Auth. Provider   predniSONE (DELTASONE) 20 MG tablet Take 2 tablets (40 mg total) by mouth daily for 5 days. 10 tablet Maryruth Sol, FNP      PDMP not reviewed this encounter.   Maryruth Sol, [place] 07/14/23 1026

## 2023-08-08 ENCOUNTER — Ambulatory Visit (INDEPENDENT_AMBULATORY_CARE_PROVIDER_SITE_OTHER): Payer: Self-pay | Admitting: Physician Assistant

## 2023-08-08 ENCOUNTER — Encounter: Payer: Self-pay | Admitting: Physician Assistant

## 2023-08-08 VITALS — BP 138/84 | HR 76 | Ht 61.0 in | Wt 166.0 lb

## 2023-08-08 DIAGNOSIS — M25561 Pain in right knee: Secondary | ICD-10-CM

## 2023-08-08 DIAGNOSIS — R519 Headache, unspecified: Secondary | ICD-10-CM

## 2023-08-08 DIAGNOSIS — R200 Anesthesia of skin: Secondary | ICD-10-CM

## 2023-08-08 LAB — CBC WITH DIFFERENTIAL/PLATELET
Basophils Absolute: 0 10*3/uL (ref 0.0–0.1)
Basophils Relative: 0.6 % (ref 0.0–3.0)
Eosinophils Absolute: 0.1 10*3/uL (ref 0.0–0.7)
Eosinophils Relative: 2.7 % (ref 0.0–5.0)
HCT: 39.2 % (ref 36.0–46.0)
Hemoglobin: 13.1 g/dL (ref 12.0–15.0)
Lymphocytes Relative: 40.3 % (ref 12.0–46.0)
Lymphs Abs: 1.8 10*3/uL (ref 0.7–4.0)
MCHC: 33.4 g/dL (ref 30.0–36.0)
MCV: 84.9 fl (ref 78.0–100.0)
Monocytes Absolute: 0.4 10*3/uL (ref 0.1–1.0)
Monocytes Relative: 8.9 % (ref 3.0–12.0)
Neutro Abs: 2.2 10*3/uL (ref 1.4–7.7)
Neutrophils Relative %: 47.5 % (ref 43.0–77.0)
Platelets: 246 10*3/uL (ref 150.0–400.0)
RBC: 4.61 Mil/uL (ref 3.87–5.11)
RDW: 13.1 % (ref 11.5–15.5)
WBC: 4.6 10*3/uL (ref 4.0–10.5)

## 2023-08-08 LAB — COMPREHENSIVE METABOLIC PANEL WITH GFR
ALT: 47 U/L — ABNORMAL HIGH (ref 0–35)
AST: 38 U/L — ABNORMAL HIGH (ref 0–37)
Albumin: 4.4 g/dL (ref 3.5–5.2)
Alkaline Phosphatase: 93 U/L (ref 39–117)
BUN: 9 mg/dL (ref 6–23)
CO2: 27 meq/L (ref 19–32)
Calcium: 9.4 mg/dL (ref 8.4–10.5)
Chloride: 105 meq/L (ref 96–112)
Creatinine, Ser: 0.71 mg/dL (ref 0.40–1.20)
GFR: 90.23 mL/min (ref >60.00)
Glucose, Bld: 97 mg/dL (ref 70–99)
Potassium: 3.9 meq/L (ref 3.5–5.1)
Sodium: 141 meq/L (ref 135–145)
Total Bilirubin: 0.4 mg/dL (ref 0.2–1.2)
Total Protein: 7.1 g/dL (ref 6.0–8.3)

## 2023-08-08 LAB — HEMOGLOBIN A1C: Hgb A1c MFr Bld: 6.2 % (ref 4.6–6.5)

## 2023-08-08 MED ORDER — MELOXICAM 15 MG PO TABS: 15.0000 mg | ORAL_TABLET | Freq: Every day | ORAL | 0 refills | Status: DC

## 2023-08-08 NOTE — Progress Notes (Signed)
 New patient visit   Patient: Evelyn Dillon   DOB: 1960-09-16   63 y.o. Female  MRN: [id number] Visit Date: 08/08/2023  Today's healthcare provider: Trenton Frock, PA-C   Cc. Knee pain, headaches  Subjective    Evelyn Dillon is a 63 y.o. female who presents today as a new patient to establish care. She is accompanied by family and a translator today.  Discussed the use of AI scribe software for clinical note transcription with the patient, who gave verbal consent to proceed.  History of Present Illness   Evelyn Dillon is a 63 year old female who presents with knee pain and headaches.  She has experienced right knee pain for two months, initially managed with home remedies and ointments. After a month, she received a short course of steroids from urgent care, which provided some relief. The knee remains inflamed, especially when walking. There are no recent injuries, but she recalls a fall two years ago.  Knee pain five years ago was alleviated with exercises and massages.  Numbness in her right leg and foot occurs when standing for long periods and improved with medication from urgent care. There are no back injuries or surgeries.  Headaches have been present for the past week, described as pressure-like pain radiating to her left eye. They occur daily and are temporarily relieved by Tylenol . She experiences photophobia and requires increased focus during headaches. She has a history of frequent headaches, with the last similar episode about a year ago. There are no allergies or sinus issues. Denies dizziness, vision changes.      Past Medical History:  Diagnosis Date   Kidney infection    Pancreatitis    Syncope    a. 04/2014: tele unrevealing, echo with normal EF & grade 2 DD, normal nuc. Etiology not clear.   Past Surgical History:  Procedure Laterality Date   TUBAL LIGATION     Patient states that she had birth control surgery, possible tubal ligation   Family Status   Relation Name Status   Sister  Alive   Sister  Deceased   Mother  Alive   Father  Deceased   Brother  Alive   Brother  Alive   Brother  Alive   Brother  Alive  No partnership data on file   Family History  Problem Relation Age of Onset   Diabetes Mellitus I Sister    Heart disease Sister        Died at 42   Social History   Socioeconomic History   Marital status: Single    Spouse name: Not on file   Number of children: 7   Years of education: Not on file   Highest education level: Not on file  Occupational History   Not on file  Tobacco Use   Smoking status: Never   Smokeless tobacco: Never  Vaping Use   Vaping status: Never Used  Substance and Sexual Activity   Alcohol use: No    Alcohol/week: 0.0 standard drinks of alcohol   Drug use: No   Sexual activity: Yes    Birth control/protection: None  Other Topics Concern   Not on file  Social History Narrative   Not on file   Social Drivers of Health   Financial Resource Strain: Not on file  Food Insecurity: Not on file  Transportation Needs: Not on file  Physical Activity: Not on file  Stress: Not on file  Social Connections: Not on file   No  outpatient medications prior to visit.   No facility-administered medications prior to visit.   No Known Allergies  Immunization History  Administered Date(s) Administered   Influenza,inj,Quad PF,6+ Mos 02/12/2016, 01/31/2019    Health Maintenance  Topic Date Due   Hepatitis C Screening  Never done   DTaP/Tdap/Td (1 - Tdap) Never done   Colonoscopy  Never done   Zoster Vaccines- Shingrix (1 of 2) Never done   MAMMOGRAM  02/09/2013   Cervical Cancer Screening (HPV/Pap Cotest)  12/12/2021   COVID-19 Vaccine (3 - 2024-25 season) 11/13/2022   INFLUENZA VACCINE  10/13/2023   HIV Screening  Completed   HPV VACCINES  Aged Out   Meningococcal B Vaccine  Aged Out    Patient Care Team: Joaquin Mulberry, MD as PCP - General (Family Medicine)  Review of Systems   Constitutional:  Negative for fatigue and fever.  Respiratory:  Negative for cough and shortness of breath.   Cardiovascular:  Negative for chest pain and leg swelling.  Gastrointestinal:  Negative for abdominal pain.  Musculoskeletal:  Positive for arthralgias.  Neurological:  Positive for headaches. Negative for dizziness.        Objective    BP 138/84   Pulse 76   Ht 5\' 1"  (1.549 m)   Wt 166 lb (75.3 kg)   LMP 03/15/2011   BMI 31.37 kg/m     Physical Exam Constitutional:      General: She is awake.     Appearance: She is well-developed.  HENT:     Head: Normocephalic.  Eyes:     Extraocular Movements: Extraocular movements intact.     Conjunctiva/sclera: Conjunctivae normal.     Pupils: Pupils are equal, round, and reactive to light.  Cardiovascular:     Rate and Rhythm: Normal rate and regular rhythm.     Heart sounds: Normal heart sounds.  Pulmonary:     Effort: Pulmonary effort is normal.     Breath sounds: Normal breath sounds.  Musculoskeletal:     Comments: No visible edema, erythema, injury. Tender along the whole knee, anterior, posterior, lateral, medial.  No joint laxity. Right LE strength 5/5.  Skin:    General: Skin is warm.  Neurological:     General: No focal deficit present.     Mental Status: She is alert and oriented to person, place, and time.  Psychiatric:        Attention and Perception: Attention normal.        Mood and Affect: Mood normal.        Speech: Speech normal.        Behavior: Behavior is cooperative.    Depression Screen    08/08/2023    8:53 AM 05/03/2018   11:19 AM 12/12/2016    4:20 PM 10/14/2016    4:14 PM  PHQ 2/9 Scores  PHQ - 2 Score 0 1 2 2   PHQ- 9 Score  2 8 5    No results found for any visits on 08/08/23.  Assessment & Plan     Acute pain of right knee Will check cbc, cmp, A1c given numbness.  Rx meloxicam , referral to sports meds. -     Meloxicam ; Take 1 tablet (15 mg total) by mouth daily.  Dispense:  30 tablet; Refill: 0 -     Ambulatory referral to Sports Medicine  Numbness of right foot -     CBC with Differential/Platelet -     Comprehensive metabolic panel with GFR -  Hemoglobin A1c -     Ambulatory referral to Sports Medicine  Acute nonintractable headache, unspecified headache type Neuro exam grossly normal Will check labs If no improvement with meloxicam , consider triptan -     CBC with Differential/Platelet -     Comprehensive metabolic panel with GFR -     Hemoglobin A1c   Return in about 3 months (around 11/08/2023), or if symptoms worsen or fail to improve, for CPE.      Trenton Frock, PA-C  [facility] [phone] (phone) [phone] (fax)  [facility]

## 2023-08-09 ENCOUNTER — Ambulatory Visit: Payer: Self-pay | Admitting: Sports Medicine

## 2023-08-09 ENCOUNTER — Ambulatory Visit: Payer: Self-pay | Admitting: Physician Assistant

## 2023-08-09 DIAGNOSIS — R7989 Other specified abnormal findings of blood chemistry: Secondary | ICD-10-CM

## 2023-09-03 ENCOUNTER — Inpatient Hospital Stay (HOSPITAL_COMMUNITY)
Admission: EM | Admit: 2023-09-03 | Discharge: 2023-09-09 | DRG: 419 | Disposition: A | Discharge status: Home / Self Care | Payer: MEDICAID | Attending: Internal Medicine | Admitting: Internal Medicine

## 2023-09-03 ENCOUNTER — Other Ambulatory Visit: Payer: Self-pay

## 2023-09-03 ENCOUNTER — Encounter (HOSPITAL_COMMUNITY): Payer: Self-pay

## 2023-09-03 ENCOUNTER — Emergency Department (HOSPITAL_COMMUNITY): Payer: MEDICAID

## 2023-09-03 DIAGNOSIS — Z90710 Acquired absence of both cervix and uterus: Secondary | ICD-10-CM

## 2023-09-03 DIAGNOSIS — Z833 Family history of diabetes mellitus: Secondary | ICD-10-CM

## 2023-09-03 DIAGNOSIS — K8020 Calculus of gallbladder without cholecystitis without obstruction: Secondary | ICD-10-CM | POA: Diagnosis present

## 2023-09-03 DIAGNOSIS — K838 Other specified diseases of biliary tract: Secondary | ICD-10-CM

## 2023-09-03 DIAGNOSIS — K828 Other specified diseases of gallbladder: Secondary | ICD-10-CM | POA: Diagnosis present

## 2023-09-03 DIAGNOSIS — K760 Fatty (change of) liver, not elsewhere classified: Secondary | ICD-10-CM | POA: Diagnosis present

## 2023-09-03 DIAGNOSIS — K8500 Idiopathic acute pancreatitis without necrosis or infection: Principal | ICD-10-CM | POA: Diagnosis present

## 2023-09-03 DIAGNOSIS — Z603 Acculturation difficulty: Secondary | ICD-10-CM | POA: Diagnosis present

## 2023-09-03 DIAGNOSIS — Z6831 Body mass index (BMI) 31.0-31.9, adult: Secondary | ICD-10-CM

## 2023-09-03 DIAGNOSIS — D509 Iron deficiency anemia, unspecified: Secondary | ICD-10-CM | POA: Diagnosis present

## 2023-09-03 DIAGNOSIS — N3941 Urge incontinence: Secondary | ICD-10-CM | POA: Diagnosis present

## 2023-09-03 DIAGNOSIS — K859 Acute pancreatitis without necrosis or infection, unspecified: Secondary | ICD-10-CM | POA: Diagnosis present

## 2023-09-03 DIAGNOSIS — R300 Dysuria: Secondary | ICD-10-CM | POA: Diagnosis present

## 2023-09-03 DIAGNOSIS — Z9049 Acquired absence of other specified parts of digestive tract: Secondary | ICD-10-CM

## 2023-09-03 DIAGNOSIS — K5730 Diverticulosis of large intestine without perforation or abscess without bleeding: Secondary | ICD-10-CM | POA: Diagnosis present

## 2023-09-03 DIAGNOSIS — Z87440 Personal history of urinary (tract) infections: Secondary | ICD-10-CM

## 2023-09-03 DIAGNOSIS — Z8249 Family history of ischemic heart disease and other diseases of the circulatory system: Secondary | ICD-10-CM

## 2023-09-03 DIAGNOSIS — F4321 Adjustment disorder with depressed mood: Secondary | ICD-10-CM | POA: Diagnosis present

## 2023-09-03 DIAGNOSIS — E669 Obesity, unspecified: Secondary | ICD-10-CM | POA: Diagnosis present

## 2023-09-03 DIAGNOSIS — K861 Other chronic pancreatitis: Secondary | ICD-10-CM | POA: Diagnosis present

## 2023-09-03 DIAGNOSIS — E876 Hypokalemia: Secondary | ICD-10-CM | POA: Diagnosis present

## 2023-09-03 LAB — URINALYSIS, ROUTINE W REFLEX MICROSCOPIC
Bilirubin Urine: NEGATIVE
Glucose, UA: NEGATIVE mg/dL
Hgb urine dipstick: NEGATIVE
Ketones, ur: NEGATIVE mg/dL
Nitrite: POSITIVE — AB
Protein, ur: NEGATIVE mg/dL
Specific Gravity, Urine: 1.019 (ref 1.005–1.030)
pH: 6 (ref 5.0–8.0)

## 2023-09-03 LAB — COMPREHENSIVE METABOLIC PANEL WITH GFR
ALT: 30 U/L (ref 0–44)
AST: 37 U/L (ref 15–41)
Albumin: 4 g/dL (ref 3.5–5.0)
Alkaline Phosphatase: 85 U/L (ref 38–126)
Anion gap: 12 (ref 5–15)
BUN: 11 mg/dL (ref 8–23)
CO2: 23 mmol/L (ref 22–32)
Calcium: 9.1 mg/dL (ref 8.9–10.3)
Chloride: 106 mmol/L (ref 98–111)
Creatinine, Ser: 0.99 mg/dL (ref 0.44–1.00)
GFR, Estimated: >60 mL/min (ref >60)
Glucose, Bld: 137 mg/dL — ABNORMAL HIGH (ref 70–99)
Potassium: 3.3 mmol/L — ABNORMAL LOW (ref 3.5–5.1)
Sodium: 141 mmol/L (ref 135–145)
Total Bilirubin: 0.4 mg/dL (ref 0.0–1.2)
Total Protein: 6.2 g/dL — ABNORMAL LOW (ref 6.5–8.1)

## 2023-09-03 LAB — CBC
HCT: 41.9 % (ref 36.0–46.0)
Hemoglobin: 13.9 g/dL (ref 12.0–15.0)
MCH: 29.1 pg (ref 26.0–34.0)
MCHC: 33.2 g/dL (ref 30.0–36.0)
MCV: 87.8 fL (ref 80.0–100.0)
Platelets: 340 10*3/uL (ref 150–400)
RBC: 4.77 MIL/uL (ref 3.87–5.11)
RDW: 12.5 % (ref 11.5–15.5)
WBC: 11.4 10*3/uL — ABNORMAL HIGH (ref 4.0–10.5)
nRBC: 0 % (ref 0.0–0.2)

## 2023-09-03 LAB — LIPASE, BLOOD: Lipase: >2800 U/L — ABNORMAL HIGH (ref 11–51)

## 2023-09-03 MED ORDER — FENTANYL CITRATE PF 50 MCG/ML IJ SOSY
50.0000 ug | PREFILLED_SYRINGE | Freq: Once | INTRAMUSCULAR | Status: AC
  Administered 2023-09-03: 50 ug via INTRAVENOUS
  Filled 2023-09-03: qty 1

## 2023-09-03 MED ORDER — ONDANSETRON HCL 4 MG/2ML IJ SOLN
4.0000 mg | Freq: Once | INTRAMUSCULAR | Status: AC | PRN
  Administered 2023-09-03: 4 mg via INTRAVENOUS
  Filled 2023-09-03: qty 2

## 2023-09-03 MED ORDER — HYDROMORPHONE HCL 1 MG/ML IJ SOLN
0.2000 mg | INTRAMUSCULAR | Status: AC | PRN
  Administered 2023-09-04 – 2023-09-06 (×9): 0.2 mg via INTRAVENOUS
  Filled 2023-09-03 (×9): qty 1

## 2023-09-03 MED ORDER — POTASSIUM CHLORIDE 10 MEQ/100ML IV SOLN
10.0000 meq | INTRAVENOUS | Status: AC
  Administered 2023-09-03 (×2): 10 meq via INTRAVENOUS
  Filled 2023-09-03 (×2): qty 100

## 2023-09-03 MED ORDER — HEPARIN SODIUM (PORCINE) 5000 UNIT/ML IJ SOLN
5000.0000 [IU] | Freq: Three times a day (TID) | INTRAMUSCULAR | Status: DC
  Administered 2023-09-03 – 2023-09-08 (×15): 5000 [IU] via SUBCUTANEOUS
  Filled 2023-09-03 (×16): qty 1

## 2023-09-03 MED ORDER — SODIUM CHLORIDE 0.9 % IV BOLUS
1000.0000 mL | Freq: Once | INTRAVENOUS | Status: AC
  Administered 2023-09-03: 1000 mL via INTRAVENOUS

## 2023-09-03 MED ORDER — LACTATED RINGERS IV BOLUS
1000.0000 mL | Freq: Once | INTRAVENOUS | Status: AC
Start: 2023-09-03 — End: 2023-09-04
  Administered 2023-09-03: 1000 mL via INTRAVENOUS

## 2023-09-03 MED ORDER — MORPHINE SULFATE (PF) 4 MG/ML IV SOLN
4.0000 mg | Freq: Once | INTRAVENOUS | Status: AC
  Administered 2023-09-03: 4 mg via INTRAVENOUS
  Filled 2023-09-03: qty 1

## 2023-09-03 MED ORDER — LACTATED RINGERS IV SOLN: INTRAVENOUS | Status: DC

## 2023-09-03 MED ORDER — IOHEXOL 350 MG/ML SOLN
75.0000 mL | Freq: Once | INTRAVENOUS | Status: AC | PRN
  Administered 2023-09-03: 75 mL via INTRAVENOUS

## 2023-09-03 NOTE — ED Notes (Signed)
 This nurse called [facility] to have patient monitored.

## 2023-09-03 NOTE — ED Provider Notes (Signed)
 [facility] Provider Note   CSN: [id number] Arrival date & time: 09/03/23  1307     Patient presents with: Abdominal Pain   Evelyn Dillon is a 63 y.o. female.   Patient with history of idiopathic pancreatitis presents today with complaints of abdominal pain.  She states that same began a few hours prior to arrival.  She endorses associated nausea and vomiting.  Feels like pancreatitis she has had previously.  Denies any EtOH use.  No history of abdominal surgeries.  No fevers or chills.  The history is provided by the patient. A language interpreter was used (Manage speaking only requiring video interpreter services).  Abdominal Pain Associated symptoms: nausea and vomiting        Prior to Admission medications   Medication Sig Start Date End Date Taking? Authorizing Provider  meloxicam  (MOBIC ) 15 MG tablet Take 1 tablet (15 mg total) by mouth daily. 08/08/23   Cyndi Shaver, PA-C    Allergies: Patient has no known allergies.    Review of Systems  Gastrointestinal:  Positive for abdominal pain, nausea and vomiting.  All other systems reviewed and are negative.   Updated Vital Signs BP 129/68 (BP Location: Right Arm)   Pulse (!) 49   Temp 98.2 F (36.8 C) (Oral)   Resp 14   Ht 5' 1 (1.549 m)   Wt 75 kg   LMP 03/15/2011   SpO2 98%   BMI 31.24 kg/m   Physical Exam Vitals and nursing note reviewed.  Constitutional:      General: She is not in acute distress.    Appearance: Normal appearance. She is normal weight. She is not ill-appearing, toxic-appearing or diaphoretic.  HENT:     Head: Normocephalic and atraumatic.   Cardiovascular:     Rate and Rhythm: Normal rate.  Pulmonary:     Effort: Pulmonary effort is normal. No respiratory distress.  Abdominal:     General: Abdomen is flat.     Palpations: Abdomen is soft.     Tenderness: There is generalized abdominal tenderness. There is guarding.    Musculoskeletal:        General: Normal range of motion.     Cervical back: Normal range of motion.   Skin:    General: Skin is warm and dry.   Neurological:     General: No focal deficit present.     Mental Status: She is alert.   Psychiatric:        Mood and Affect: Mood normal.        Behavior: Behavior normal.     (all labs ordered are listed, but only abnormal results are displayed) Labs Reviewed  COMPREHENSIVE METABOLIC PANEL WITH GFR - Abnormal; Notable for the following components:      Result Value   Potassium 3.3 (*)    Glucose, Bld 137 (*)    Total Protein 6.2 (*)    All other components within normal limits  CBC - Abnormal; Notable for the following components:   WBC 11.4 (*)    All other components within normal limits  LIPASE, BLOOD  URINALYSIS, ROUTINE W REFLEX MICROSCOPIC    EKG: None  Radiology: No results found.   Procedures   Medications Ordered in the ED  iohexol  (OMNIPAQUE ) 350 MG/ML injection 75 mL (has no administration in time range)  ondansetron  (ZOFRAN ) injection 4 mg (4 mg Intravenous Given 09/03/23 1346)  fentaNYL (SUBLIMAZE) injection 50 mcg (50 mcg Intravenous Given 09/03/23  1410)  morphine  (PF) 4 MG/ML injection 4 mg (4 mg Intravenous Given 09/03/23 1613)  sodium chloride  0.9 % bolus 1,000 mL (1,000 mLs Intravenous New Bag/Given 09/03/23 1612)                                    Medical Decision Making Amount and/or Complexity of Data Reviewed Labs: ordered.  Risk Prescription drug management.   This patient is a 63 y.o. female who presents to the ED for concern of abdominal pain, this involves an extensive number of treatment options, and is a complaint that carries with it a high risk of complications and morbidity. The emergent differential diagnosis prior to evaluation includes, but is not limited to,  PUD, gastritis, pancreatitis, gastroparesis, malignancy, biliary disease, ACS, pericarditis, pneumonia, intestinal  ischemia, esophageal rupture, hepatitis, pregnancy  This is not an exhaustive differential.   Past Medical History / Co-morbidities / Social History:  has a past medical history of Kidney infection, Pancreatitis, and Syncope.  Patient is Spanish-speaking only requiring interpreter services.  Additional history: Chart reviewed. Pertinent results include: Pancreatitis not attributed to alcohol use, looks like she has had extensive workup without clear etiology of her recurrent pancreatitis.  Physical Exam: Physical exam performed. The pertinent findings include: Uncomfortable appearing, generalized abdominal tenderness to palpation throughout.  Lab Tests: I ordered, and personally interpreted labs.  The pertinent results include:  WBC 11.4, K 3.3, lipase >2,800   Imaging Studies: I ordered imaging studies including CT abdomen pelvis. I independently visualized and interpreted imaging which showed   1. Findings compatible with acute pancreatitis. No evidence for necrosis or fluid collection. 2. Hepatomegaly with fatty infiltration of the liver. 3. Colonic diverticulosis. 4. Minimal patchy airspace opacities in the right lower lobe, possibly infectious/inflammatory.   I agree with the radiologist interpretation.   Cardiac Monitoring:  The patient was maintained on a cardiac monitor.  My attending physician Dr. Freddi viewed and interpreted the cardiac monitored which showed an underlying rhythm of: sinus rhythm, no STEMI. I agree with this interpretation.   Medications: I ordered medication including fentanyl, zofran , morphine , fluids for pain, nausea/vomiting, dehydration. Reevaluation of the patient after these medicines showed that the patient improved. I have reviewed the patients home medicines and have made adjustments as needed.   Disposition: After consideration of the diagnostic results and the patients response to treatment, I feel that patient will require admission for  management of acute pancreatitis.  Discussed same with patient is understanding and in agreement.  She is requesting admission.   Discussed patient with hospitalist who accepts patient for admission  Final diagnoses:  Idiopathic acute pancreatitis without infection or necrosis    ED Discharge Orders     None          Nora Lauraine DELENA DEVONNA 09/03/23 LEANOR Freddi Hamilton, MD 09/07/23 [phone]

## 2023-09-03 NOTE — ED Triage Notes (Signed)
 Pt BIB family in severe upper abd pain and vomited started 1 hour ago. Denies diarrhea. Nothing to eat today. Drank coffee this morning.

## 2023-09-03 NOTE — H&P (Signed)
 History and Physical    Evelyn Dillon FMW:[id number] DOB: 05/04/1960 DOA: 09/03/2023  Patient coming from: Home.  Engineer, structural used.  Chief Complaint: Abdominal pain.  HPI: Evelyn Dillon is a 63 y.o. female with history of recurrent pancreatitis of unknown cause the last time patient had an attack of pancreatitis was in January 2020 presents to the ER with complaints of severe abdominal pain with multiple episodes of nausea vomiting.  Denies any diarrhea.  Pain is mostly in the epigastric area radiating across the abdomen.  Patient denies drinking alcohol.  ED Course: In the ER patient's labs show lipase of more than 2800 AST ALT and bilirubin were normal.  CT abdomen pelvis shows features concerning for pancreatitis.  Patient admitted for further observation.  Potassium was 3.3.  Review of Systems: As per HPI, rest all negative.   Past Medical History:  Diagnosis Date   Kidney infection    Pancreatitis    Syncope    a. 04/2014: tele unrevealing, echo with normal EF & grade 2 DD, normal nuc. Etiology not clear.    Past Surgical History:  Procedure Laterality Date   TUBAL LIGATION     Patient states that she had birth control surgery, possible tubal ligation     reports that she has never smoked. She has never used smokeless tobacco. She reports that she does not drink alcohol and does not use drugs.  No Known Allergies  Family History  Problem Relation Age of Onset   Diabetes Mellitus I Sister    Heart disease Sister        Died at 70    Prior to Admission medications   Medication Sig Start Date End Date Taking? Authorizing Provider  acetaminophen  (TYLENOL ) 500 MG tablet Take 500 mg by mouth in the morning and at bedtime.   Yes [provider]  meloxicam  (MOBIC ) 15 MG tablet Take 1 tablet (15 mg total) by mouth daily. Patient not taking: Reported on 09/03/2023 08/08/23   Cyndi Shaver, PA-C    Physical Exam: Constitutional: Moderately built and  nourished. Vitals:   09/03/23 2030 09/03/23 2045 09/03/23 2100 09/03/23 2115  BP: 120/71 133/73 132/72 136/80  Pulse: (!) 57 (!) 54 (!) 55 (!) 55  Resp: 19 16 13 10   Temp:      TempSrc:      SpO2: 93% 99% 97% 97%  Weight:      Height:       Eyes: Anicteric no pallor. ENMT: No discharge from the ears eyes nose and mouth. Neck: No mass felt.  No neck rigidity. Respiratory: No rhonchi or crepitations. Cardiovascular: S1-S2 heard. Abdomen: Soft epigastric tenderness present.  No guarding or rigidity. Musculoskeletal: No edema. Skin: No rash. Neurologic: Alert awake oriented time place and person.  Moves all extremities. Psychiatric: Appears normal.  Normal affect.   Labs on Admission: I have personally reviewed following labs and imaging studies  CBC: Recent Labs  Lab 09/03/23 1345  WBC 11.4*  HGB 13.9  HCT 41.9  MCV 87.8  PLT 340   Basic Metabolic Panel: Recent Labs  Lab 09/03/23 1345  NA 141  K 3.3*  CL 106  CO2 23  GLUCOSE 137*  BUN 11  CREATININE 0.99  CALCIUM 9.1   GFR: Estimated Creatinine Clearance: 53.9 mL/min (by C-G formula based on SCr of 0.99 mg/dL). Liver Function Tests: Recent Labs  Lab 09/03/23 1345  AST 37  ALT 30  ALKPHOS 85  BILITOT 0.4  PROT 6.2*  ALBUMIN 4.0   Recent Labs  Lab 09/03/23 1345  LIPASE >2,800*   No results for input(s): AMMONIA in the last 168 hours. Coagulation Profile: No results for input(s): INR, PROTIME in the last 168 hours. Cardiac Enzymes: No results for input(s): CKTOTAL, CKMB, CKMBINDEX, TROPONINI in the last 168 hours. BNP (last 3 results) No results for input(s): PROBNP in the last 8760 hours. HbA1C: No results for input(s): HGBA1C in the last 72 hours. CBG: No results for input(s): GLUCAP in the last 168 hours. Lipid Profile: No results for input(s): CHOL, HDL, LDLCALC, TRIG, CHOLHDL, LDLDIRECT in the last 72 hours. Thyroid  Function Tests: No results for input(s):  TSH, T4TOTAL, FREET4, T3FREE, THYROIDAB in the last 72 hours. Anemia Panel: No results for input(s): VITAMINB12, FOLATE, FERRITIN, TIBC, IRON, RETICCTPCT in the last 72 hours. Urine analysis:    Component Value Date/Time   COLORURINE YELLOW 09/03/2023 1339   APPEARANCEUR CLEAR 09/03/2023 1339   LABSPEC 1.019 09/03/2023 1339   PHURINE 6.0 09/03/2023 1339   GLUCOSEU NEGATIVE 09/03/2023 1339   HGBUR NEGATIVE 09/03/2023 1339   BILIRUBINUR NEGATIVE 09/03/2023 1339   BILIRUBINUR negative 05/03/2018 1153   KETONESUR NEGATIVE 09/03/2023 1339   PROTEINUR NEGATIVE 09/03/2023 1339   UROBILINOGEN 0.2 05/03/2018 1153   UROBILINOGEN 0.2 01/03/2015 2238   NITRITE POSITIVE (A) 09/03/2023 1339   LEUKOCYTESUR TRACE (A) 09/03/2023 1339   Sepsis Labs: @LABRCNTIP (procalcitonin:4,lacticidven:4) )No results found for this or any previous visit (from the past 240 hours).   Radiological Exams on Admission: CT ABDOMEN PELVIS W CONTRAST Result Date: 09/03/2023 CLINICAL DATA:  Pancreatitis suspected. EXAM: CT ABDOMEN AND PELVIS WITH CONTRAST TECHNIQUE: Multidetector CT imaging of the abdomen and pelvis was performed using the standard protocol following bolus administration of intravenous contrast. RADIATION DOSE REDUCTION: This exam was performed according to the departmental dose-optimization program which includes automated exposure control, adjustment of the mA and/or kV according to patient size and/or use of iterative reconstruction technique. CONTRAST:  75mL OMNIPAQUE  IOHEXOL  350 MG/ML SOLN COMPARISON:  CT abdomen and pelvis 02/11/2016. FINDINGS: Lower chest: There are minimal patchy airspace opacities in the right lower lobe. Hepatobiliary: There is diffuse fatty infiltration of the liver. The liver is enlarged. Gallbladder and bile ducts are within normal limits. Skip there is moderate diffuse peripancreatic fluid and stranding compatible with acute pancreatitis. Pancreas enhances  normally. There is no ductal dilatation or fluid collection. Pancreas: Unremarkable. No pancreatic ductal dilatation or surrounding inflammatory changes. Spleen: Normal in size without focal abnormality. Adrenals/Urinary Tract: Adrenal glands are unremarkable. Kidneys are normal, without renal calculi, focal lesion, or hydronephrosis. Bladder is unremarkable. Stomach/Bowel: Stomach is within normal limits. Appendix appears normal. No evidence of bowel wall thickening, distention, or inflammatory changes. There is diffuse colonic diverticulosis. Vascular/Lymphatic: No significant vascular findings are present. No enlarged abdominal or pelvic lymph nodes. Reproductive: Uterus and bilateral adnexa are unremarkable. Other: No abdominal wall hernia or abnormality. No abdominopelvic ascites. Musculoskeletal: No fracture is seen. IMPRESSION: 1. Findings compatible with acute pancreatitis. No evidence for necrosis or fluid collection. 2. Hepatomegaly with fatty infiltration of the liver. 3. Colonic diverticulosis. 4. Minimal patchy airspace opacities in the right lower lobe, possibly infectious/inflammatory. Electronically Signed   By: Greig Pique M.D.   On: 09/03/2023 17:38    EKG: Independently reviewed.  Normal sinus rhythm.  Assessment/Plan Active Problems:   Acute pancreatitis   Hypokalemia    Acute pancreatitis -       patient has had prior episodes of pancreatitis cause was not  clear.  Last time patient had pancreatitis was in January 2020.  Will keep patient n.p.o. IV fluids pain relief medications.  Check triglycerides. Mild hypokalemia likely from nausea and vomiting.  Replace and recheck.  Since patient has acute pancreatitis will need close monitoring further workup and more than 2 midnight stay.   DVT prophylaxis: Heparin . Code Status: Full code. Family Communication: Patient's son and husband at the bedside. Disposition Plan: Monitored bed. Consults called: Gastroenterologist. Admission  status: Observation.

## 2023-09-03 NOTE — ED Notes (Signed)
 Extra DG drawn

## 2023-09-03 NOTE — ED Provider Triage Note (Signed)
 Emergency Medicine Provider Triage Evaluation Note  Evelyn Dillon , a 63 y.o. female  was evaluated in triage.  Pt complains of epigastric pain upper abdominal pain that radiates to the back.  Previous history of recurrent pancreatitis.  Sudden onset approximately 2 to 3 hours prior to presentation.  Denies recent EtOH use.  Review of Systems  Positive: Upper abdominal pain, nausea, vomiting Negative: Diarrhea  Physical Exam  BP 129/84   Pulse 61   Temp 98.4 F (36.9 C) (Oral)   Resp 20   Wt 75 kg   LMP 03/15/2011   SpO2 100%   BMI 31.24 kg/m  Gen:   Awake, not in acute distress but very uncomfortable and ill-appearing. Resp:  Normal effort  MSK:   Moves extremities without difficulty  Other:  Abdomen is exquisitely tender to the epigastrium, further generalized abdominal tenderness.  Bowel sounds are present though reduced.  Medical Decision Making  Medically screening exam initiated at 2:07 PM.  Appropriate orders placed.  Evelyn Dillon was informed that the remainder of the evaluation will be completed by another provider, this initial triage assessment does not replace that evaluation, and the importance of remaining in the ED until their evaluation is complete.  Based on evaluation this patient suspect this is due to acute pancreatitis.  Labs and workup obtained for the same along with CT imaging of the abdomen with contrast.   Evelyn Dorn BROCKS, PA 09/03/23 [phone]

## 2023-09-03 NOTE — ED Notes (Signed)
 Patient transported to CT

## 2023-09-04 ENCOUNTER — Observation Stay (HOSPITAL_COMMUNITY): Payer: MEDICAID

## 2023-09-04 DIAGNOSIS — K8500 Idiopathic acute pancreatitis without necrosis or infection: Secondary | ICD-10-CM | POA: Diagnosis present

## 2023-09-04 DIAGNOSIS — Z8249 Family history of ischemic heart disease and other diseases of the circulatory system: Secondary | ICD-10-CM | POA: Diagnosis not present

## 2023-09-04 DIAGNOSIS — Z90710 Acquired absence of both cervix and uterus: Secondary | ICD-10-CM | POA: Diagnosis not present

## 2023-09-04 DIAGNOSIS — K828 Other specified diseases of gallbladder: Secondary | ICD-10-CM | POA: Diagnosis present

## 2023-09-04 DIAGNOSIS — K5730 Diverticulosis of large intestine without perforation or abscess without bleeding: Secondary | ICD-10-CM | POA: Diagnosis present

## 2023-09-04 DIAGNOSIS — D509 Iron deficiency anemia, unspecified: Secondary | ICD-10-CM | POA: Diagnosis present

## 2023-09-04 DIAGNOSIS — R109 Unspecified abdominal pain: Secondary | ICD-10-CM | POA: Diagnosis present

## 2023-09-04 DIAGNOSIS — K861 Other chronic pancreatitis: Secondary | ICD-10-CM | POA: Diagnosis present

## 2023-09-04 DIAGNOSIS — E669 Obesity, unspecified: Secondary | ICD-10-CM | POA: Diagnosis present

## 2023-09-04 DIAGNOSIS — K760 Fatty (change of) liver, not elsewhere classified: Secondary | ICD-10-CM | POA: Diagnosis present

## 2023-09-04 DIAGNOSIS — K8020 Calculus of gallbladder without cholecystitis without obstruction: Secondary | ICD-10-CM | POA: Diagnosis present

## 2023-09-04 DIAGNOSIS — Z603 Acculturation difficulty: Secondary | ICD-10-CM | POA: Diagnosis present

## 2023-09-04 DIAGNOSIS — Z87440 Personal history of urinary (tract) infections: Secondary | ICD-10-CM | POA: Diagnosis not present

## 2023-09-04 DIAGNOSIS — Z833 Family history of diabetes mellitus: Secondary | ICD-10-CM | POA: Diagnosis not present

## 2023-09-04 DIAGNOSIS — R300 Dysuria: Secondary | ICD-10-CM | POA: Diagnosis present

## 2023-09-04 DIAGNOSIS — Z6831 Body mass index (BMI) 31.0-31.9, adult: Secondary | ICD-10-CM | POA: Diagnosis not present

## 2023-09-04 DIAGNOSIS — E876 Hypokalemia: Secondary | ICD-10-CM | POA: Diagnosis present

## 2023-09-04 DIAGNOSIS — N3941 Urge incontinence: Secondary | ICD-10-CM | POA: Diagnosis present

## 2023-09-04 DIAGNOSIS — F4321 Adjustment disorder with depressed mood: Secondary | ICD-10-CM | POA: Diagnosis present

## 2023-09-04 LAB — COMPREHENSIVE METABOLIC PANEL WITH GFR
ALT: 26 U/L (ref 0–44)
AST: 27 U/L (ref 15–41)
Albumin: 3.3 g/dL — ABNORMAL LOW (ref 3.5–5.0)
Alkaline Phosphatase: 77 U/L (ref 38–126)
Anion gap: 12 (ref 5–15)
BUN: 8 mg/dL (ref 8–23)
CO2: 22 mmol/L (ref 22–32)
Calcium: 8.5 mg/dL — ABNORMAL LOW (ref 8.9–10.3)
Chloride: 106 mmol/L (ref 98–111)
Creatinine, Ser: 0.77 mg/dL (ref 0.44–1.00)
GFR, Estimated: >60 mL/min (ref >60)
Glucose, Bld: 139 mg/dL — ABNORMAL HIGH (ref 70–99)
Potassium: 4.4 mmol/L (ref 3.5–5.1)
Sodium: 140 mmol/L (ref 135–145)
Total Bilirubin: 0.6 mg/dL (ref 0.0–1.2)
Total Protein: 6.1 g/dL — ABNORMAL LOW (ref 6.5–8.1)

## 2023-09-04 LAB — TRIGLYCERIDES: Triglycerides: 68 mg/dL (ref <150)

## 2023-09-04 LAB — CBC
HCT: 39.1 % (ref 36.0–46.0)
Hemoglobin: 12.6 g/dL (ref 12.0–15.0)
MCH: 28.8 pg (ref 26.0–34.0)
MCHC: 32.2 g/dL (ref 30.0–36.0)
MCV: 89.5 fL (ref 80.0–100.0)
Platelets: 264 10*3/uL (ref 150–400)
RBC: 4.37 MIL/uL (ref 3.87–5.11)
RDW: 12.6 % (ref 11.5–15.5)
WBC: 6.6 10*3/uL (ref 4.0–10.5)
nRBC: 0 % (ref 0.0–0.2)

## 2023-09-04 LAB — HIV ANTIBODY (ROUTINE TESTING W REFLEX): HIV Screen 4th Generation wRfx: NONREACTIVE

## 2023-09-04 MED ORDER — LACTATED RINGERS IV SOLN: INTRAVENOUS | Status: AC

## 2023-09-04 NOTE — Consult Note (Signed)
 Consultation Note   Referring Provider:  [facility] PCP: Delbert Clam, MD Primary Gastroenterologist:         Reason for Consultation:  DOA: 09/03/2023         Hospital Day: 2   ASSESSMENT      63 year old Spanish-speaking female admitted with recurrent acute pancreatitis ( had in 2016, 2017, 2020) of unclear etiology.  Prior evaluations including triglyceride levels, calcium levels, gallbladder /biliary tree/pancreatic imaging all unrevealing.  This admission triglyceride level is normal, serum calcium is not elevated.  Unclear about med she was taking during previous episodes but she is taking presently meloxicam  which can be associated with acute pancreatitis.   See PMH for additional history  Active Problems:   Acute pancreatitis   Hypokalemia   PLAN:   --Await RUQ ultrasound -- Continue IV fluids at 150 mL an hour -- Monitor Hct , renal function --N.p.o. for now -- Analgesics, antiemetics as needed  HPI   *Patient does not speak [place].  Granddaughter in room and serves as interpreter  63 y.o. year old female with a medical history including but not limited to hepatomegaly, fatty liver disease  Patient began having generalized upper abdominal pain radiating through to her back yesterday around 12:30 PM.  The pain was constant until she presented to the ED yesterday and received pain medications.  She had associated nausea and vomiting.  Denies fevers.  Patient does not drink alcohol.  She takes Tylenol  and meloxicam  at home but denies use of any other medications.  In the ED her white count was mildly elevated at 11.4, hematocrit 41.9, serum calcium 8.5, LFTs normal, lipase greater than 2800, renal function normal.  Triglycerides 68 CTAP with contrast compatible with acute pancreatitis without fluid collections or necrosis  She has received 1000 mL LR bolus followed by LR at 150 mL an hour.  Today hematocrit is  39.1, WBC down to 6.6.  Renal function remains normal.  LFTs remain normal.   Evelyn Dillon has had at least 3 prior episodes of acute pancreatitis with negative extensive workup including normal gallbladder  / biliary tree / pancreas iming,  triglycerides, serum calcium. She doesn't consume Etoh. No FMH of pancreatitis diseases / cancers.    Labs and Imaging:  Recent Labs    09/03/23 1345 09/04/23 0058  PROT 6.2* 6.1*  ALBUMIN 4.0 3.3*  AST 37 27  ALT 30 26  ALKPHOS 85 77  BILITOT 0.4 0.6   Recent Labs    09/03/23 1345 09/04/23 0058  WBC 11.4* 6.6  HGB 13.9 12.6  HCT 41.9 39.1  MCV 87.8 89.5  PLT 340 264   Recent Labs    09/03/23 1345 09/04/23 0058  NA 141 140  K 3.3* 4.4  CL 106 106  CO2 23 22  GLUCOSE 137* 139*  BUN 11 8  CREATININE 0.99 0.77  CALCIUM 9.1 8.5*     CT ABDOMEN PELVIS W CONTRAST CLINICAL DATA:  Pancreatitis suspected.  EXAM: CT ABDOMEN AND PELVIS WITH CONTRAST  TECHNIQUE: Multidetector CT imaging of the abdomen and pelvis was performed using the standard protocol following bolus administration of intravenous contrast.  RADIATION DOSE REDUCTION: This exam was performed according to the departmental dose-optimization program which includes  automated exposure control, adjustment of the mA and/or kV according to patient size and/or use of iterative reconstruction technique.  CONTRAST:  75mL OMNIPAQUE  IOHEXOL  350 MG/ML SOLN  COMPARISON:  CT abdomen and pelvis 02/11/2016.  FINDINGS: Lower chest: There are minimal patchy airspace opacities in the right lower lobe.  Hepatobiliary: There is diffuse fatty infiltration of the liver. The liver is enlarged. Gallbladder and bile ducts are within normal limits. Skip there is moderate diffuse peripancreatic fluid and stranding compatible with acute pancreatitis. Pancreas enhances normally. There is no ductal dilatation or fluid collection.  Pancreas: Unremarkable. No pancreatic ductal dilatation  or surrounding inflammatory changes.  Spleen: Normal in size without focal abnormality.  Adrenals/Urinary Tract: Adrenal glands are unremarkable. Kidneys are normal, without renal calculi, focal lesion, or hydronephrosis. Bladder is unremarkable.  Stomach/Bowel: Stomach is within normal limits. Appendix appears normal. No evidence of bowel wall thickening, distention, or inflammatory changes. There is diffuse colonic diverticulosis.  Vascular/Lymphatic: No significant vascular findings are present. No enlarged abdominal or pelvic lymph nodes.  Reproductive: Uterus and bilateral adnexa are unremarkable.  Other: No abdominal wall hernia or abnormality. No abdominopelvic ascites.  Musculoskeletal: No fracture is seen.  IMPRESSION: 1. Findings compatible with acute pancreatitis. No evidence for necrosis or fluid collection. 2. Hepatomegaly with fatty infiltration of the liver. 3. Colonic diverticulosis. 4. Minimal patchy airspace opacities in the right lower lobe, possibly infectious/inflammatory.  Electronically Signed   By: Greig Pique M.D.   On: 09/03/2023 17:38    Pertinent GI Studies         Past Medical History:  Diagnosis Date   Kidney infection    Pancreatitis    Syncope    a. 04/2014: tele unrevealing, echo with normal EF & grade 2 DD, normal nuc. Etiology not clear.    Past Surgical History:  Procedure Laterality Date   TUBAL LIGATION     Patient states that she had birth control surgery, possible tubal ligation    Family History  Problem Relation Age of Onset   Diabetes Mellitus I Sister    Heart disease Sister        Died at 63    Prior to Admission medications   Medication Sig Start Date End Date Taking? Authorizing Provider  acetaminophen  (TYLENOL ) 500 MG tablet Take 500 mg by mouth in the morning and at bedtime.   Yes [provider]  meloxicam  (MOBIC ) 15 MG tablet Take 1 tablet (15 mg total) by mouth daily. Patient not taking:  Reported on 09/03/2023 08/08/23   Cyndi Shaver, PA-C    Current Facility-Administered Medications  Medication Dose Route Frequency Provider Last Rate Last Admin   heparin  injection 5,000 Units  5,000 Units Subcutaneous Q8H Kakrakandy, Arshad N, MD   5,000 Units at 09/04/23 9367   HYDROmorphone  (DILAUDID ) injection 0.2 mg  0.2 mg Intravenous Q3H PRN Kakrakandy, Arshad N, MD   0.2 mg at 09/04/23 0630   lactated ringers infusion   Intravenous Continuous Krishnan, Gokul, MD       Current Outpatient Medications  Medication Sig Dispense Refill   acetaminophen  (TYLENOL ) 500 MG tablet Take 500 mg by mouth in the morning and at bedtime.     meloxicam  (MOBIC ) 15 MG tablet Take 1 tablet (15 mg total) by mouth daily. (Patient not taking: Reported on 09/03/2023) 30 tablet 0    Allergies as of 09/03/2023   (No Known Allergies)    Social History   Socioeconomic History   Marital status: Single  Spouse name: Not on file   Number of children: 7   Years of education: Not on file   Highest education level: Not on file  Occupational History   Not on file  Tobacco Use   Smoking status: Never   Smokeless tobacco: Never  Vaping Use   Vaping status: Never Used  Substance and Sexual Activity   Alcohol use: No    Alcohol/week: 0.0 standard drinks of alcohol   Drug use: No   Sexual activity: Yes    Birth control/protection: None  Other Topics Concern   Not on file  Social History Narrative   Not on file   Social Drivers of Health   Financial Resource Strain: Not on file  Food Insecurity: Not on file  Transportation Needs: Not on file  Physical Activity: Not on file  Stress: Not on file  Social Connections: Not on file  Intimate Partner Violence: Not on file     Code Status   Code Status: Full Code  Review of Systems: All systems reviewed and negative except where noted in HPI.  Physical Exam: Vital signs in last 24 hours: Temp:  [97.6 F (36.4 C)-98.6 F (37 C)] 97.6 F  (36.4 C) (06/23 0732) Pulse Rate:  [49-68] 59 (06/23 0730) Resp:  [10-20] 18 (06/23 0730) BP: (118-158)/(63-84) 124/63 (06/23 0700) SpO2:  [93 %-100 %] 98 % (06/23 0730) Weight:  [75 kg] 75 kg (06/22 1540)    General:  Pleasant female in NAD Psych:  Cooperative. Normal mood and affect Eyes: Pupils equal Ears:  Normal auditory acuity Nose: No deformity, discharge or lesions Neck:  Supple, no masses felt Lungs:  Clear to auscultation.  Heart:  Regular rate, regular rhythm.  Abdomen:  Soft, nondistended, moderate generalized upper abdominal tenderness , a few  active bowel sounds, no masses felt Rectal :  Deferred Msk: Symmetrical without gross deformities.  Neurologic:  Alert, oriented, grossly normal neurologically Extremities : No edema Skin:  Intact without significant lesions.    Intake/Output from previous day: 06/22 0701 - 06/23 0700 In: 1000 [IV Piggyback:1000] Out: -  Intake/Output this shift:  No intake/output data recorded.   Vina Dasen, NP-C   09/04/2023, 9:08 AM

## 2023-09-04 NOTE — ED Notes (Signed)
 Pt unplugged from the monitor and able to walk to the restroom.

## 2023-09-04 NOTE — Plan of Care (Signed)

## 2023-09-04 NOTE — Progress Notes (Signed)
 [facility] PROGRESS NOTE   Evelyn Dillon FMW:[id number] DOB: Jun 18, 1960 DOA: 09/03/2023  PCP: Delbert Clam, MD  Brief History:  63 y.o. female with history of recurrent pancreatitis of unknown cause the last time patient had an attack of pancreatitis was in January 2020 presented to the ER with complaints of severe abdominal pain with multiple episodes of nausea vomiting.  She was found to have elevated lipase level.  She was hospitalized for further management.    Consultants: None yet  Procedures: None    Subjective/Interval History: Complains of 6 out of pain in the abdomen.  Had some nausea and vomiting yesterday but none this morning.  Her granddaughter is at the bedside and she was able to interpret.    Assessment/Plan:  Acute pancreatitis Came in with abdominal pain nausea and vomiting and found to have a lipase level of greater than 2800.  CT scan did not show any complicating features. CT scan did not suggest gallstones.  Will proceed with right upper quadrant ultrasound.  Of note ultrasound from 2020 did not show any gallstones either.  Triglyceride level was normal. Patient denies being on any prescription medications.  Has not taken any new medications.  She denies any alcohol use. Etiology for her pancreatitis is not entirely clear.  After her previous episode of pancreatitis she does not think that she was seen by a gastroenterologist.  She will benefit from being evaluated by gastroenterology in the outpatient setting for further evaluation of her pancreatitis. Continue supportive care for now.  Pain medications.  N.p.o. status for now.  Will initiate clear liquids later today if she does not have any further episodes of nausea and vomiting.  Hypokalemia Supplemented.  Right lower lobe patchy opacities Might have aspirated.  However she denies any respiratory symptoms.  Does not have cough.  Probably a chemical aspiration.  Will hold off on  antibiotics.  Hepatic steatosis Noted incidentally on CT scan.  Outpatient monitoring.  Diverticulosis Incidentally noted on CT scan.  Avoid constipation.  Does not appear that she has ever had a colonoscopy.  This can be addressed in the outpatient setting.  Obesity Estimated body mass index is 31.24 kg/m as calculated from the following:   Height as of this encounter: 5' 1 (1.549 m).   Weight as of this encounter: 75 kg.   DVT Prophylaxis: Subcutaneous heparin  Code Status: Full code Family Communication: Discussed with patient and her granddaughter Disposition Plan: Hopefully return home when improved.  Mobilize.  Status is: Observation The patient will require care spanning > 2 midnights and should be moved to inpatient because: Acute pancreatitis with nausea and vomiting    Medications: Scheduled:  heparin   5,000 Units Subcutaneous Q8H   Continuous:  lactated ringers 150 mL/hr at 09/04/23 0825   PRN:HYDROmorphone  (DILAUDID ) injection   Objective:  Vital Signs  Vitals:   09/04/23 0619 09/04/23 0700 09/04/23 0730 09/04/23 0732  BP:  124/63    Pulse:  (!) 57 (!) 59   Resp:  15 18   Temp: 98.4 F (36.9 C)   97.6 F (36.4 C)  TempSrc: Oral   Oral  SpO2:  93% 98%   Weight:      Height:        Intake/Output Summary (Last 24 hours) at 09/04/2023 0853 Last data filed at 09/03/2023 1740 Gross per 24 hour  Intake 1000 ml  Output --  Net 1000 ml   Filed Weights   09/03/23 1336 09/03/23 1540  Weight:  75 kg 75 kg    General appearance: Awake alert.  In no distress Resp: Clear to auscultation bilaterally.  Normal effort Cardio: S1-S2 is normal regular.  No S3-S4.  No rubs murmurs or bruit GI: Abdomen is soft.  Tender diffusely without any rebound rigidity or guarding.  No masses organomegaly.  Bowel sounds sluggish but present. Extremities: No edema.  Full range of motion of lower extremities. Neurologic: No focal neurological deficits.    Lab  Results:  Data Reviewed: I have personally reviewed following labs and reports of the imaging studies  CBC: Recent Labs  Lab 09/03/23 1345 09/04/23 0058  WBC 11.4* 6.6  HGB 13.9 12.6  HCT 41.9 39.1  MCV 87.8 89.5  PLT 340 264    Basic Metabolic Panel: Recent Labs  Lab 09/03/23 1345 09/04/23 0058  NA 141 140  K 3.3* 4.4  CL 106 106  CO2 23 22  GLUCOSE 137* 139*  BUN 11 8  CREATININE 0.99 0.77  CALCIUM 9.1 8.5*    GFR: Estimated Creatinine Clearance: 66.7 mL/min (by C-G formula based on SCr of 0.77 mg/dL).  Liver Function Tests: Recent Labs  Lab 09/03/23 1345 09/04/23 0058  AST 37 27  ALT 30 26  ALKPHOS 85 77  BILITOT 0.4 0.6  PROT 6.2* 6.1*  ALBUMIN 4.0 3.3*    Recent Labs  Lab 09/03/23 1345  LIPASE >2,800*   Lipid Profile: Recent Labs    09/04/23 0058  TRIG 68     Radiology Studies: CT ABDOMEN PELVIS W CONTRAST Result Date: 09/03/2023 CLINICAL DATA:  Pancreatitis suspected. EXAM: CT ABDOMEN AND PELVIS WITH CONTRAST TECHNIQUE: Multidetector CT imaging of the abdomen and pelvis was performed using the standard protocol following bolus administration of intravenous contrast. RADIATION DOSE REDUCTION: This exam was performed according to the departmental dose-optimization program which includes automated exposure control, adjustment of the mA and/or kV according to patient size and/or use of iterative reconstruction technique. CONTRAST:  75mL OMNIPAQUE  IOHEXOL  350 MG/ML SOLN COMPARISON:  CT abdomen and pelvis 02/11/2016. FINDINGS: Lower chest: There are minimal patchy airspace opacities in the right lower lobe. Hepatobiliary: There is diffuse fatty infiltration of the liver. The liver is enlarged. Gallbladder and bile ducts are within normal limits. Skip there is moderate diffuse peripancreatic fluid and stranding compatible with acute pancreatitis. Pancreas enhances normally. There is no ductal dilatation or fluid collection. Pancreas: Unremarkable. No  pancreatic ductal dilatation or surrounding inflammatory changes. Spleen: Normal in size without focal abnormality. Adrenals/Urinary Tract: Adrenal glands are unremarkable. Kidneys are normal, without renal calculi, focal lesion, or hydronephrosis. Bladder is unremarkable. Stomach/Bowel: Stomach is within normal limits. Appendix appears normal. No evidence of bowel wall thickening, distention, or inflammatory changes. There is diffuse colonic diverticulosis. Vascular/Lymphatic: No significant vascular findings are present. No enlarged abdominal or pelvic lymph nodes. Reproductive: Uterus and bilateral adnexa are unremarkable. Other: No abdominal wall hernia or abnormality. No abdominopelvic ascites. Musculoskeletal: No fracture is seen. IMPRESSION: 1. Findings compatible with acute pancreatitis. No evidence for necrosis or fluid collection. 2. Hepatomegaly with fatty infiltration of the liver. 3. Colonic diverticulosis. 4. Minimal patchy airspace opacities in the right lower lobe, possibly infectious/inflammatory. Electronically Signed   By: Greig Pique M.D.   On: 09/03/2023 17:38       LOS: 0 days   Dannel Rafter Verdene  [facility] Pager on [email or web address]  09/04/2023, 8:53 AM

## 2023-09-05 ENCOUNTER — Inpatient Hospital Stay (HOSPITAL_COMMUNITY): Payer: MEDICAID

## 2023-09-05 LAB — COMPREHENSIVE METABOLIC PANEL WITH GFR
ALT: 21 U/L (ref 0–44)
AST: 20 U/L (ref 15–41)
Albumin: 3 g/dL — ABNORMAL LOW (ref 3.5–5.0)
Alkaline Phosphatase: 64 U/L (ref 38–126)
Anion gap: 8 (ref 5–15)
BUN: 6 mg/dL — ABNORMAL LOW (ref 8–23)
CO2: 25 mmol/L (ref 22–32)
Calcium: 8.4 mg/dL — ABNORMAL LOW (ref 8.9–10.3)
Chloride: 103 mmol/L (ref 98–111)
Creatinine, Ser: 0.69 mg/dL (ref 0.44–1.00)
GFR, Estimated: >60 mL/min (ref >60)
Glucose, Bld: 100 mg/dL — ABNORMAL HIGH (ref 70–99)
Potassium: 3.9 mmol/L (ref 3.5–5.1)
Sodium: 136 mmol/L (ref 135–145)
Total Bilirubin: 0.9 mg/dL (ref 0.0–1.2)
Total Protein: 5.7 g/dL — ABNORMAL LOW (ref 6.5–8.1)

## 2023-09-05 LAB — CBC
HCT: 36.1 % (ref 36.0–46.0)
Hemoglobin: 12 g/dL (ref 12.0–15.0)
MCH: 29.3 pg (ref 26.0–34.0)
MCHC: 33.2 g/dL (ref 30.0–36.0)
MCV: 88.3 fL (ref 80.0–100.0)
Platelets: 221 10*3/uL (ref 150–400)
RBC: 4.09 MIL/uL (ref 3.87–5.11)
RDW: 12.7 % (ref 11.5–15.5)
WBC: 7.7 10*3/uL (ref 4.0–10.5)
nRBC: 0 % (ref 0.0–0.2)

## 2023-09-05 LAB — LIPASE, BLOOD: Lipase: 71 U/L — ABNORMAL HIGH (ref 11–51)

## 2023-09-05 LAB — MAGNESIUM: Magnesium: 1.7 mg/dL (ref 1.7–2.4)

## 2023-09-05 LAB — HEPATITIS B SURFACE ANTIGEN: Hepatitis B Surface Ag: NONREACTIVE

## 2023-09-05 MED ORDER — GADOBUTROL 1 MMOL/ML IV SOLN
7.0000 mL | Freq: Once | INTRAVENOUS | Status: AC | PRN
  Administered 2023-09-05: 7 mL via INTRAVENOUS

## 2023-09-05 MED ORDER — LACTATED RINGERS IV SOLN: INTRAVENOUS | Status: AC

## 2023-09-05 NOTE — Progress Notes (Signed)
 [facility] PROGRESS NOTE   Evelyn Dillon FMW:[id number] DOB: 02/28/1961 DOA: 09/03/2023  PCP: Delbert Clam, MD  Brief History:  63 y.o. female with history of recurrent pancreatitis of unknown cause the last time patient had an attack of pancreatitis was in January 2020 presented to the ER with complaints of severe abdominal pain with multiple episodes of nausea vomiting.  She was found to have elevated lipase level.  She was hospitalized for further management.    Consultants: None yet  Procedures: None    Subjective/Interval History: Granddaughter was at the bedside and if she was able to interpret.  Patient mentions that abdominal pain has improved to 4 out of 10 in intensity.  No nausea vomiting.  Tolerating Liquids.  Passing some gas from below but no bowel movements yet.    Assessment/Plan:  Acute pancreatitis Came in with abdominal pain nausea and vomiting and found to have a lipase level of greater than 2800.  CT scan did not show any complicating features. CT scan did not suggest gallstones.  Right upper quadrant ultrasound did not show any gallstones.  Fatty liver was seen.   Triglyceride level was normal. Patient underwent MRCP which showed acute pancreatitis without any complicating features Gastroenterology was consulted by admitting provider.  They are following the patient. She clinically appears to be improving.  Lipase level has improved.  LFTs are normal.  Mobilize.  Hopefully diet can be advanced further today.  Anticipate discharge in 24 to 48 hours.  Hypokalemia Supplemented.  Right lower lobe patchy opacities Might have aspirated.  However she denies any respiratory symptoms.  Does not have cough.  Probably a chemical aspiration.  Will hold off on antibiotics.  Hepatic steatosis Noted incidentally on CT scan.  Outpatient monitoring.  Diverticulosis Incidentally noted on CT scan.  Avoid constipation.  Does not appear that she has ever had a  colonoscopy.  This can be addressed in the outpatient setting.  Obesity Estimated body mass index is 31.24 kg/m as calculated from the following:   Height as of this encounter: 5' 1 (1.549 m).   Weight as of this encounter: 75 kg.   DVT Prophylaxis: Subcutaneous heparin  Code Status: Full code Family Communication: Discussed with patient and her granddaughter Disposition Plan: Hopefully return home when improved.  Mobilize.    Medications: Scheduled:  heparin   5,000 Units Subcutaneous Q8H   Continuous:   PRN:HYDROmorphone  (DILAUDID ) injection   Objective:  Vital Signs  Vitals:   09/04/23 1953 09/04/23 2334 09/05/23 0321 09/05/23 0749  BP: 134/73 (!) 157/69 139/69 121/67  Pulse: 73 79 76 73  Resp:  18 18 18   Temp: 99.5 F (37.5 C) 98.3 F (36.8 C) 98.9 F (37.2 C)   TempSrc:      SpO2: 94% 92% 93% 91%  Weight:      Height:        Intake/Output Summary (Last 24 hours) at 09/05/2023 0954 Last data filed at 09/05/2023 0317 Gross per 24 hour  Intake 2179.16 ml  Output --  Net 2179.16 ml   Filed Weights   09/03/23 1336 09/03/23 1540  Weight: 75 kg 75 kg    General appearance: Awake alert.  In no distress Resp: Clear to auscultation bilaterally.  Normal effort Cardio: S1-S2 is normal regular.  No S3-S4.  No rubs murmurs or bruit GI: Abdomen is soft.  Less tender today compared to yesterday.  Bowel sounds are present. No obvious focal neurological deficits.  Lab Results:  Data Reviewed: I  have personally reviewed following labs and reports of the imaging studies  CBC: Recent Labs  Lab 09/03/23 1345 09/04/23 0058 09/05/23 0333  WBC 11.4* 6.6 7.7  HGB 13.9 12.6 12.0  HCT 41.9 39.1 36.1  MCV 87.8 89.5 88.3  PLT 340 264 221    Basic Metabolic Panel: Recent Labs  Lab 09/03/23 1345 09/04/23 0058 09/05/23 0333  NA 141 140 136  K 3.3* 4.4 3.9  CL 106 106 103  CO2 23 22 25   GLUCOSE 137* 139* 100*  BUN 11 8 6*  CREATININE 0.99 0.77 0.69  CALCIUM  9.1 8.5* 8.4*  MG  --   --  1.7    GFR: Estimated Creatinine Clearance: 66.7 mL/min (by C-G formula based on SCr of 0.69 mg/dL).  Liver Function Tests: Recent Labs  Lab 09/03/23 1345 09/04/23 0058 09/05/23 0333  AST 37 27 20  ALT 30 26 21   ALKPHOS 85 77 64  BILITOT 0.4 0.6 0.9  PROT 6.2* 6.1* 5.7*  ALBUMIN 4.0 3.3* 3.0*    Recent Labs  Lab 09/03/23 1345 09/05/23 0333  LIPASE >2,800* 71*   Lipid Profile: Recent Labs    09/04/23 0058  TRIG 68     Radiology Studies: MR ABDOMEN MRCP W WO CONTAST Result Date: 09/05/2023 CLINICAL DATA:  Recurrent pancreatitis. EXAM: MRI ABDOMEN WITHOUT AND WITH CONTRAST (INCLUDING MRCP) TECHNIQUE: Multiplanar multisequence MR imaging of the abdomen was performed both before and after the administration of intravenous contrast. Heavily T2-weighted images of the biliary and pancreatic ducts were obtained, and three-dimensional MRCP images were rendered by post processing. CONTRAST:  7mL GADAVIST GADOBUTROL 1 MMOL/ML IV SOLN COMPARISON:  CT 09/03/2023 and U/S abdomen limited 09/04/2023. FINDINGS: Lower chest: There is trace pleural fluid noted within the lung bases along with overlying atelectasis versus consolidation. Hepatobiliary: Mild perihepatic fluid. Diffuse hepatic steatosis. No suspicious liver lesion.There is sludge noted within the gallbladder but no gallstones. No significant gallbladder wall thickening identified. The common bile duct measures up to 6 mm in diameter. No signs of choledocholithiasis Pancreas: There is diffuse pancreatic edema. Peripancreatic soft tissue stranding is identified there is also peripancreatic fluid which extends into bilateral retroperitoneum. No focal loculated fluid collections identified to suggest pseudocyst. No main duct dilatation. Spleen: Mild perisplenic fluid. No focal splenic abnormality noted. Adrenals/Urinary Tract: No masses identified. No evidence of hydronephrosis. Stomach/Bowel: No acute  abnormality. Vascular/Lymphatic: Normal appearance of the abdominal aorta. The upper abdominal vascularity appears patent. No significant abdominal adenopathy. Other:  None Musculoskeletal: No suspicious bone lesions identified. IMPRESSION: 1. Imaging findings compatible with acute pancreatitis. No focal loculated fluid collections identified to suggest pseudocyst. 2. Sludge noted within the gallbladder but no gallstones. No signs of choledocholithiasis. 3. Diffuse hepatic steatosis. 4. Trace pleural fluid noted within the lung bases along with overlying atelectasis versus consolidation. Electronically Signed   By: Waddell Calk M.D.   On: 09/05/2023 06:34   MR 3D Recon At Scanner Result Date: 09/05/2023 CLINICAL DATA:  Recurrent pancreatitis. EXAM: MRI ABDOMEN WITHOUT AND WITH CONTRAST (INCLUDING MRCP) TECHNIQUE: Multiplanar multisequence MR imaging of the abdomen was performed both before and after the administration of intravenous contrast. Heavily T2-weighted images of the biliary and pancreatic ducts were obtained, and three-dimensional MRCP images were rendered by post processing. CONTRAST:  7mL GADAVIST GADOBUTROL 1 MMOL/ML IV SOLN COMPARISON:  CT 09/03/2023 and U/S abdomen limited 09/04/2023. FINDINGS: Lower chest: There is trace pleural fluid noted within the lung bases along with overlying atelectasis versus consolidation. Hepatobiliary: Mild  perihepatic fluid. Diffuse hepatic steatosis. No suspicious liver lesion.There is sludge noted within the gallbladder but no gallstones. No significant gallbladder wall thickening identified. The common bile duct measures up to 6 mm in diameter. No signs of choledocholithiasis Pancreas: There is diffuse pancreatic edema. Peripancreatic soft tissue stranding is identified there is also peripancreatic fluid which extends into bilateral retroperitoneum. No focal loculated fluid collections identified to suggest pseudocyst. No main duct dilatation. Spleen: Mild  perisplenic fluid. No focal splenic abnormality noted. Adrenals/Urinary Tract: No masses identified. No evidence of hydronephrosis. Stomach/Bowel: No acute abnormality. Vascular/Lymphatic: Normal appearance of the abdominal aorta. The upper abdominal vascularity appears patent. No significant abdominal adenopathy. Other:  None Musculoskeletal: No suspicious bone lesions identified. IMPRESSION: 1. Imaging findings compatible with acute pancreatitis. No focal loculated fluid collections identified to suggest pseudocyst. 2. Sludge noted within the gallbladder but no gallstones. No signs of choledocholithiasis. 3. Diffuse hepatic steatosis. 4. Trace pleural fluid noted within the lung bases along with overlying atelectasis versus consolidation. Electronically Signed   By: Waddell Calk M.D.   On: 09/05/2023 06:34   US  Abdomen Limited RUQ (LIVER/GB) Result Date: 09/04/2023 CLINICAL DATA:  Right upper quadrant pain.  Pancreatitis EXAM: ULTRASOUND ABDOMEN LIMITED RIGHT UPPER QUADRANT COMPARISON:  CT 09/03/2023.  Ultrasound 03/31/2018. FINDINGS: Gallbladder: No gallstones or wall thickening visualized. No sonographic Murphy sign noted by sonographer. Common bile duct: Diameter: 5 mm Liver: Diffusely echogenic hepatic parenchyma consistent with fatty liver infiltration. Portal vein is patent on color Doppler imaging with normal direction of blood flow towards the liver. Other: None. IMPRESSION: Fatty liver infiltration.  No gallstones or ductal dilatation. Electronically Signed   By: Ranell Bring M.D.   On: 09/04/2023 12:15   CT ABDOMEN PELVIS W CONTRAST Result Date: 09/03/2023 CLINICAL DATA:  Pancreatitis suspected. EXAM: CT ABDOMEN AND PELVIS WITH CONTRAST TECHNIQUE: Multidetector CT imaging of the abdomen and pelvis was performed using the standard protocol following bolus administration of intravenous contrast. RADIATION DOSE REDUCTION: This exam was performed according to the departmental dose-optimization  program which includes automated exposure control, adjustment of the mA and/or kV according to patient size and/or use of iterative reconstruction technique. CONTRAST:  75mL OMNIPAQUE  IOHEXOL  350 MG/ML SOLN COMPARISON:  CT abdomen and pelvis 02/11/2016. FINDINGS: Lower chest: There are minimal patchy airspace opacities in the right lower lobe. Hepatobiliary: There is diffuse fatty infiltration of the liver. The liver is enlarged. Gallbladder and bile ducts are within normal limits. Skip there is moderate diffuse peripancreatic fluid and stranding compatible with acute pancreatitis. Pancreas enhances normally. There is no ductal dilatation or fluid collection. Pancreas: Unremarkable. No pancreatic ductal dilatation or surrounding inflammatory changes. Spleen: Normal in size without focal abnormality. Adrenals/Urinary Tract: Adrenal glands are unremarkable. Kidneys are normal, without renal calculi, focal lesion, or hydronephrosis. Bladder is unremarkable. Stomach/Bowel: Stomach is within normal limits. Appendix appears normal. No evidence of bowel wall thickening, distention, or inflammatory changes. There is diffuse colonic diverticulosis. Vascular/Lymphatic: No significant vascular findings are present. No enlarged abdominal or pelvic lymph nodes. Reproductive: Uterus and bilateral adnexa are unremarkable. Other: No abdominal wall hernia or abnormality. No abdominopelvic ascites. Musculoskeletal: No fracture is seen. IMPRESSION: 1. Findings compatible with acute pancreatitis. No evidence for necrosis or fluid collection. 2. Hepatomegaly with fatty infiltration of the liver. 3. Colonic diverticulosis. 4. Minimal patchy airspace opacities in the right lower lobe, possibly infectious/inflammatory. Electronically Signed   By: Greig Pique M.D.   On: 09/03/2023 17:38       LOS: 1  day   Petr Bontempo  [facility] Pager on [email or web address]  09/05/2023, 9:54 AM

## 2023-09-05 NOTE — Plan of Care (Signed)

## 2023-09-05 NOTE — Progress Notes (Signed)
 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  Prior evaluations including triglyceride levels, calcium levels, IgG4 , hepatobiliary tree / pancreatic imaging all unrevealing.  Meloxicam  has been rarely associated with pancreatitis but not sure that she would have been taking it at the time of all the other prior episodes of pancreatitis  TODAY>> MRCP shows gallbladder sludge and a 6 mm CBD without any evidence for choledocholithiasis.  Lipase 71.  Has been adequately volume resuscitated . Hematocrit 36%.  Renal function normal .  Active Problems:   Acute pancreatitis   Hypokalemia   PLAN   --Continue maintenance IV fluids at 75 mL for now until taking more p.o. -- Continue supportive care -- Hopefully can advance to low-fat diet tomorrow if continues to improve   Subjective   Abdominal pain better compared to yesterday.  So far only 1 dose of pain meds today.  Tolerated a small amount of cream of chicken soup.  No distention  Objective   GI Studies:   Hepatitis B surface antigen negative A1AT pending  Recent Labs    09/03/23 1345 09/04/23 0058 09/05/23 0333  WBC 11.4* 6.6 7.7  HGB 13.9 12.6 12.0  HCT 41.9 39.1 36.1  MCV 87.8 89.5 88.3  PLT 340 264 221   Recent Labs    09/03/23 1345 09/04/23 0058 09/05/23 0333  NA 141 140 136  K 3.3* 4.4 3.9  CL 106 106 103  CO2 23 22 25   GLUCOSE 137* 139* 100*  BUN 11 8 6*  CREATININE 0.99 0.77 0.69  CALCIUM 9.1 8.5* 8.4*   Recent Labs    09/03/23 1345 09/04/23 0058 09/05/23 0333  PROT 6.2* 6.1* 5.7*  ALBUMIN 4.0 3.3* 3.0*  AST 37 27 20  ALT 30 26 21   ALKPHOS 85 77 64  BILITOT 0.4 0.6 0.9      Imaging:  MR 3D Recon At Scanner CLINICAL DATA:  Recurrent pancreatitis.  EXAM: MRI ABDOMEN WITHOUT AND WITH CONTRAST (INCLUDING  MRCP)  TECHNIQUE: Multiplanar multisequence MR imaging of the abdomen was performed both before and after the administration of intravenous contrast. Heavily T2-weighted images of the biliary and pancreatic ducts were obtained, and three-dimensional MRCP images were rendered by post processing.  CONTRAST:  7mL GADAVIST GADOBUTROL 1 MMOL/ML IV SOLN  COMPARISON:  CT 09/03/2023 and U/S abdomen limited 09/04/2023.  FINDINGS: Lower chest: There is trace pleural fluid noted within the lung bases along with overlying atelectasis versus consolidation.  Hepatobiliary: Mild perihepatic fluid. Diffuse hepatic steatosis. No suspicious liver lesion.There is sludge noted within the gallbladder but no gallstones. No significant gallbladder wall thickening identified. The common bile duct measures up to 6 mm in diameter. No signs of choledocholithiasis  Pancreas: There is diffuse pancreatic edema. Peripancreatic soft tissue stranding is identified there is also peripancreatic fluid which extends into bilateral retroperitoneum. No focal loculated fluid collections identified to suggest pseudocyst. No main duct dilatation.  Spleen: Mild perisplenic fluid. No focal splenic abnormality noted.  Adrenals/Urinary Tract: No masses identified. No evidence of hydronephrosis.  Stomach/Bowel: No acute abnormality.  Vascular/Lymphatic: Normal appearance of the abdominal aorta. The upper abdominal vascularity appears patent. No significant abdominal adenopathy.  Other:  None  Musculoskeletal: No suspicious bone lesions identified.  IMPRESSION: 1. Imaging findings compatible with acute pancreatitis. No focal loculated fluid collections identified to suggest  pseudocyst. 2. Sludge noted within the gallbladder but no gallstones. No signs of choledocholithiasis. 3. Diffuse hepatic steatosis. 4. Trace pleural fluid noted within the lung bases along with overlying atelectasis versus  consolidation.  Electronically Signed   By: Waddell Calk M.D.   On: 09/05/2023 06:34 MR ABDOMEN MRCP W WO CONTAST CLINICAL DATA:  Recurrent pancreatitis.  EXAM: MRI ABDOMEN WITHOUT AND WITH CONTRAST (INCLUDING MRCP)  TECHNIQUE: Multiplanar multisequence MR imaging of the abdomen was performed both before and after the administration of intravenous contrast. Heavily T2-weighted images of the biliary and pancreatic ducts were obtained, and three-dimensional MRCP images were rendered by post processing.  CONTRAST:  7mL GADAVIST GADOBUTROL 1 MMOL/ML IV SOLN  COMPARISON:  CT 09/03/2023 and U/S abdomen limited 09/04/2023.  FINDINGS: Lower chest: There is trace pleural fluid noted within the lung bases along with overlying atelectasis versus consolidation.  Hepatobiliary: Mild perihepatic fluid. Diffuse hepatic steatosis. No suspicious liver lesion.There is sludge noted within the gallbladder but no gallstones. No significant gallbladder wall thickening identified. The common bile duct measures up to 6 mm in diameter. No signs of choledocholithiasis  Pancreas: There is diffuse pancreatic edema. Peripancreatic soft tissue stranding is identified there is also peripancreatic fluid which extends into bilateral retroperitoneum. No focal loculated fluid collections identified to suggest pseudocyst. No main duct dilatation.  Spleen: Mild perisplenic fluid. No focal splenic abnormality noted.  Adrenals/Urinary Tract: No masses identified. No evidence of hydronephrosis.  Stomach/Bowel: No acute abnormality.  Vascular/Lymphatic: Normal appearance of the abdominal aorta. The upper abdominal vascularity appears patent. No significant abdominal adenopathy.  Other:  None  Musculoskeletal: No suspicious bone lesions identified.  IMPRESSION: 1. Imaging findings compatible with acute pancreatitis. No focal loculated fluid collections identified to suggest pseudocyst. 2. Sludge  noted within the gallbladder but no gallstones. No signs of choledocholithiasis. 3. Diffuse hepatic steatosis. 4. Trace pleural fluid noted within the lung bases along with overlying atelectasis versus consolidation.  Electronically Signed   By: Waddell Calk M.D.   On: 09/05/2023 06:34     Scheduled inpatient medications:   heparin   5,000 Units Subcutaneous Q8H   Continuous inpatient infusions:   lactated ringers 75 mL/hr at 09/05/23 1041   PRN inpatient medications: HYDROmorphone  (DILAUDID ) injection  Vital signs in last 24 hours: Temp:  [98.3 F (36.8 C)-99.5 F (37.5 C)] 98.9 F (37.2 C) (06/24 0321) Pulse Rate:  [73-79] 73 (06/24 0749) Resp:  [18] 18 (06/24 0749) BP: (121-157)/(67-77) 121/67 (06/24 0749) SpO2:  [91 %-95 %] 91 % (06/24 0749) Last BM Date : 09/05/23  Intake/Output Summary (Last 24 hours) at 09/05/2023 1344 Last data filed at 09/05/2023 0317 Gross per 24 hour  Intake 2179.16 ml  Output --  Net 2179.16 ml    Intake/Output from previous day: 06/23 0701 - 06/24 0700 In: 2179.2 [I.V.:2179.2] Out: -  Intake/Output this shift: No intake/output data recorded.   Physical Exam:  General: Alert female in NAD.  Family in room Heart:  Regular rate and rhythm.  Pulmonary: Normal respiratory effort Abdomen: Soft, nondistended, moderate generalized upper abdominal tenderness . Normal bowel sounds. Extremities: No lower extremity edema  Neurologic: Alert and oriented Psych: Pleasant. Cooperative    LOS: 1 day   Vina Dasen ,NP 09/05/2023, 1:44 PM

## 2023-09-05 NOTE — Consult Note (Cosign Needed Addendum)
 Evelyn Dillon December 04, 1960  [id number].    Requesting provider: Vina Dasen, NP Chief Complaint/Reason for Consult: Abdominal pain/Pancreatitis   HPI: Evelyn Dillon is a 63 y.o. Spanish-speaking female who has a recurrent history of pancreatitis dating back to 2016.  Patient's granddaughter bedside who is the historian stated that this most recent episode started around Sunday afternoon with intermittent episodes of emesis. Today patient is exquisitely tender in the epigastric region which is consistent with pancreatitis.  Past Medical History: As below Prior Abdominal Surgeries: Patient has a history of hysterectomy Blood Thinners: Denies Last PO intake: Today at 1:30 PM Last Colonoscopy: Never done per patient Allergies: None Tobacco Use: Denies Alcohol Use: Denies Substance use: Denies   ROS: Review of Systems  Constitutional:  Negative for chills and fever.  Gastrointestinal:  Positive for abdominal pain and nausea. Negative for vomiting.    Family History  Problem Relation Age of Onset   Diabetes Mellitus I Sister    Heart disease Sister        Died at 20    Past Medical History:  Diagnosis Date   Kidney infection    Pancreatitis    Syncope    a. 04/2014: tele unrevealing, echo with normal EF & grade 2 DD, normal nuc. Etiology not clear.    Past Surgical History:  Procedure Laterality Date   TUBAL LIGATION     Patient states that she had birth control surgery, possible tubal ligation    Social History:  reports that she has never smoked. She has never used smokeless tobacco. She reports that she does not drink alcohol and does not use drugs.  Allergies: No Known Allergies  Medications Prior to Admission  Medication Sig Dispense Refill   acetaminophen  (TYLENOL ) 500 MG tablet Take 500 mg by mouth in the morning and at bedtime.     meloxicam  (MOBIC ) 15 MG tablet Take 1 tablet (15 mg total) by mouth daily. (Patient not taking: Reported on 09/03/2023) 30  tablet 0     Physical Exam: Blood pressure 121/67, pulse 73, temperature 98.9 F (37.2 C), resp. rate 18, height 5' 1 (1.549 m), weight 75 kg, last menstrual period 03/15/2011, SpO2 91%. Physical Exam Constitutional:      General: She is not in acute distress.  Cardiovascular:     Rate and Rhythm: Normal rate.  Abdominal:     General: Abdomen is protuberant.     Tenderness: There is abdominal tenderness in the epigastric area. Negative signs include Murphy's sign, Rovsing's sign and McBurney's sign.   Skin:    General: Skin is warm and dry.   Neurological:     General: No focal deficit present.     Mental Status: She is alert.      Results for orders placed or performed during the hospital encounter of 09/03/23 (from the past 48 hours)  HIV Antibody (routine testing w rflx)     Status: None   Collection Time: 09/04/23 12:58 AM  Result Value Ref Range   HIV Screen 4th Generation wRfx Non Reactive Non Reactive    Comment: Performed at [facility], [address], [place], [place] [address]  CBC     Status: None   Collection Time: 09/04/23 12:58 AM  Result Value Ref Range   WBC 6.6 4.0 - 10.5 K/uL   RBC 4.37 3.87 - 5.11 MIL/uL   Hemoglobin 12.6 12.0 - 15.0 g/dL   HCT 60.8 63.9 - 53.9 %  MCV 89.5 80.0 - 100.0 fL   MCH 28.8 26.0 - 34.0 pg   MCHC 32.2 30.0 - 36.0 g/dL   RDW 87.3 88.4 - 84.4 %   Platelets 264 150 - 400 K/uL   nRBC 0.0 0.0 - 0.2 %    Comment: Performed at [facility], [address], [place], [place] [address]  Comprehensive metabolic panel     Status: Abnormal   Collection Time: 09/04/23 12:58 AM  Result Value Ref Range   Sodium 140 135 - 145 mmol/L   Potassium 4.4 3.5 - 5.1 mmol/L   Chloride 106 98 - 111 mmol/L   CO2 22 22 - 32 mmol/L   Glucose, Bld 139 (H) 70 - 99 mg/dL    Comment: Glucose reference range applies only to samples taken after fasting for at least 8 hours.   BUN 8 8 - 23 mg/dL   Creatinine, Ser 9.22 0.44 - 1.00 mg/dL    Calcium 8.5 (L) 8.9 - 10.3 mg/dL   Total Protein 6.1 (L) 6.5 - 8.1 g/dL   Albumin 3.3 (L) 3.5 - 5.0 g/dL   AST 27 15 - 41 U/L   ALT 26 0 - 44 U/L   Alkaline Phosphatase 77 38 - 126 U/L   Total Bilirubin 0.6 0.0 - 1.2 mg/dL   GFR, Estimated >39 >39 mL/min    Comment: (NOTE) Calculated using the CKD-EPI Creatinine Equation (2021)    Anion gap 12 5 - 15    Comment: Performed at [facility], [address], [place], [place] [address]  Triglycerides     Status: None   Collection Time: 09/04/23 12:58 AM  Result Value Ref Range   Triglycerides 68 <150 mg/dL    Comment: Performed at [facility], [address], [place], [place] [address]  CBC     Status: None   Collection Time: 09/05/23  3:33 AM  Result Value Ref Range   WBC 7.7 4.0 - 10.5 K/uL   RBC 4.09 3.87 - 5.11 MIL/uL   Hemoglobin 12.0 12.0 - 15.0 g/dL   HCT 63.8 63.9 - 53.9 %   MCV 88.3 80.0 - 100.0 fL   MCH 29.3 26.0 - 34.0 pg   MCHC 33.2 30.0 - 36.0 g/dL   RDW 87.2 88.4 - 84.4 %   Platelets 221 150 - 400 K/uL   nRBC 0.0 0.0 - 0.2 %    Comment: Performed at [facility], [address] [place], [place] [address]  Comprehensive metabolic panel with GFR     Status: Abnormal   Collection Time: 09/05/23  3:33 AM  Result Value Ref Range   Sodium 136 135 - 145 mmol/L   Potassium 3.9 3.5 - 5.1 mmol/L   Chloride 103 98 - 111 mmol/L   CO2 25 22 - 32 mmol/L   Glucose, Bld 100 (H) 70 - 99 mg/dL    Comment: Glucose reference range applies only to samples taken after fasting for at least 8 hours.   BUN 6 (L) 8 - 23 mg/dL   Creatinine, Ser 9.30 0.44 - 1.00 mg/dL   Calcium 8.4 (L) 8.9 - 10.3 mg/dL   Total Protein 5.7 (L) 6.5 - 8.1 g/dL   Albumin 3.0 (L) 3.5 - 5.0 g/dL   AST 20 15 - 41 U/L   ALT 21 0 - 44 U/L   Alkaline Phosphatase 64 38 - 126 U/L   Total Bilirubin 0.9 0.0 - 1.2 mg/dL  GFR, Estimated >60 >60 mL/min    Comment: (NOTE) Calculated using the CKD-EPI Creatinine Equation (2021)    Anion gap 8  5 - 15    Comment: Performed at [facility], [address], [place], [place] [address]  Magnesium     Status: None   Collection Time: 09/05/23  3:33 AM  Result Value Ref Range   Magnesium 1.7 1.7 - 2.4 mg/dL    Comment: Performed at [facility], [address] [place], [place] [address]  Lipase, blood     Status: Abnormal   Collection Time: 09/05/23  3:33 AM  Result Value Ref Range   Lipase 71 (H) 11 - 51 U/L    Comment: Performed at [facility], [address], [place], [place] [address]  Hepatitis B surface antigen     Status: None   Collection Time: 09/05/23  3:33 AM  Result Value Ref Range   Hepatitis B Surface Ag NON REACTIVE NON REACTIVE    Comment: Performed at [facility], [address] [place], [place] [address]   MR ABDOMEN MRCP W WO CONTAST Result Date: 09/05/2023 CLINICAL DATA:  Recurrent pancreatitis. EXAM: MRI ABDOMEN WITHOUT AND WITH CONTRAST (INCLUDING MRCP) TECHNIQUE: Multiplanar multisequence MR imaging of the abdomen was performed both before and after the administration of intravenous contrast. Heavily T2-weighted images of the biliary and pancreatic ducts were obtained, and three-dimensional MRCP images were rendered by post processing. CONTRAST:  7mL GADAVIST GADOBUTROL 1 MMOL/ML IV SOLN COMPARISON:  CT 09/03/2023 and U/S abdomen limited 09/04/2023. FINDINGS: Lower chest: There is trace pleural fluid noted within the lung bases along with overlying atelectasis versus consolidation. Hepatobiliary: Mild perihepatic fluid. Diffuse hepatic steatosis. No suspicious liver lesion.There is sludge noted within the gallbladder but no gallstones. No significant gallbladder wall thickening identified. The common bile duct measures up to 6 mm in diameter. No signs of choledocholithiasis Pancreas: There is diffuse pancreatic edema. Peripancreatic soft tissue stranding is identified there is also peripancreatic fluid which extends into bilateral  retroperitoneum. No focal loculated fluid collections identified to suggest pseudocyst. No main duct dilatation. Spleen: Mild perisplenic fluid. No focal splenic abnormality noted. Adrenals/Urinary Tract: No masses identified. No evidence of hydronephrosis. Stomach/Bowel: No acute abnormality. Vascular/Lymphatic: Normal appearance of the abdominal aorta. The upper abdominal vascularity appears patent. No significant abdominal adenopathy. Other:  None Musculoskeletal: No suspicious bone lesions identified. IMPRESSION: 1. Imaging findings compatible with acute pancreatitis. No focal loculated fluid collections identified to suggest pseudocyst. 2. Sludge noted within the gallbladder but no gallstones. No signs of choledocholithiasis. 3. Diffuse hepatic steatosis. 4. Trace pleural fluid noted within the lung bases along with overlying atelectasis versus consolidation. Electronically Signed   By: Waddell Calk M.D.   On: 09/05/2023 06:34   MR 3D Recon At Scanner Result Date: 09/05/2023 CLINICAL DATA:  Recurrent pancreatitis. EXAM: MRI ABDOMEN WITHOUT AND WITH CONTRAST (INCLUDING MRCP) TECHNIQUE: Multiplanar multisequence MR imaging of the abdomen was performed both before and after the administration of intravenous contrast. Heavily T2-weighted images of the biliary and pancreatic ducts were obtained, and three-dimensional MRCP images were rendered by post processing. CONTRAST:  7mL GADAVIST GADOBUTROL 1 MMOL/ML IV SOLN COMPARISON:  CT 09/03/2023 and U/S abdomen limited 09/04/2023. FINDINGS: Lower chest: There is trace pleural fluid noted within the lung bases along with overlying atelectasis versus consolidation. Hepatobiliary: Mild perihepatic fluid. Diffuse hepatic steatosis. No suspicious liver lesion.There is sludge noted within the gallbladder but no gallstones. No significant  gallbladder wall thickening identified. The common bile duct measures up to 6 mm in diameter. No signs of choledocholithiasis Pancreas:  There is diffuse pancreatic edema. Peripancreatic soft tissue stranding is identified there is also peripancreatic fluid which extends into bilateral retroperitoneum. No focal loculated fluid collections identified to suggest pseudocyst. No main duct dilatation. Spleen: Mild perisplenic fluid. No focal splenic abnormality noted. Adrenals/Urinary Tract: No masses identified. No evidence of hydronephrosis. Stomach/Bowel: No acute abnormality. Vascular/Lymphatic: Normal appearance of the abdominal aorta. The upper abdominal vascularity appears patent. No significant abdominal adenopathy. Other:  None Musculoskeletal: No suspicious bone lesions identified. IMPRESSION: 1. Imaging findings compatible with acute pancreatitis. No focal loculated fluid collections identified to suggest pseudocyst. 2. Sludge noted within the gallbladder but no gallstones. No signs of choledocholithiasis. 3. Diffuse hepatic steatosis. 4. Trace pleural fluid noted within the lung bases along with overlying atelectasis versus consolidation. Electronically Signed   By: Waddell Calk M.D.   On: 09/05/2023 06:34   US  Abdomen Limited RUQ (LIVER/GB) Result Date: 09/04/2023 CLINICAL DATA:  Right upper quadrant pain.  Pancreatitis EXAM: ULTRASOUND ABDOMEN LIMITED RIGHT UPPER QUADRANT COMPARISON:  CT 09/03/2023.  Ultrasound 03/31/2018. FINDINGS: Gallbladder: No gallstones or wall thickening visualized. No sonographic Murphy sign noted by sonographer. Common bile duct: Diameter: 5 mm Liver: Diffusely echogenic hepatic parenchyma consistent with fatty liver infiltration. Portal vein is patent on color Doppler imaging with normal direction of blood flow towards the liver. Other: None. IMPRESSION: Fatty liver infiltration.  No gallstones or ductal dilatation. Electronically Signed   By: Ranell Bring M.D.   On: 09/04/2023 12:15   CT ABDOMEN PELVIS W CONTRAST Result Date: 09/03/2023 CLINICAL DATA:  Pancreatitis suspected. EXAM: CT ABDOMEN AND PELVIS  WITH CONTRAST TECHNIQUE: Multidetector CT imaging of the abdomen and pelvis was performed using the standard protocol following bolus administration of intravenous contrast. RADIATION DOSE REDUCTION: This exam was performed according to the departmental dose-optimization program which includes automated exposure control, adjustment of the mA and/or kV according to patient size and/or use of iterative reconstruction technique. CONTRAST:  75mL OMNIPAQUE  IOHEXOL  350 MG/ML SOLN COMPARISON:  CT abdomen and pelvis 02/11/2016. FINDINGS: Lower chest: There are minimal patchy airspace opacities in the right lower lobe. Hepatobiliary: There is diffuse fatty infiltration of the liver. The liver is enlarged. Gallbladder and bile ducts are within normal limits. Skip there is moderate diffuse peripancreatic fluid and stranding compatible with acute pancreatitis. Pancreas enhances normally. There is no ductal dilatation or fluid collection. Pancreas: Unremarkable. No pancreatic ductal dilatation or surrounding inflammatory changes. Spleen: Normal in size without focal abnormality. Adrenals/Urinary Tract: Adrenal glands are unremarkable. Kidneys are normal, without renal calculi, focal lesion, or hydronephrosis. Bladder is unremarkable. Stomach/Bowel: Stomach is within normal limits. Appendix appears normal. No evidence of bowel wall thickening, distention, or inflammatory changes. There is diffuse colonic diverticulosis. Vascular/Lymphatic: No significant vascular findings are present. No enlarged abdominal or pelvic lymph nodes. Reproductive: Uterus and bilateral adnexa are unremarkable. Other: No abdominal wall hernia or abnormality. No abdominopelvic ascites. Musculoskeletal: No fracture is seen. IMPRESSION: 1. Findings compatible with acute pancreatitis. No evidence for necrosis or fluid collection. 2. Hepatomegaly with fatty infiltration of the liver. 3. Colonic diverticulosis. 4. Minimal patchy airspace opacities in the  right lower lobe, possibly infectious/inflammatory. Electronically Signed   By: Greig Pique M.D.   On: 09/03/2023 17:38    Anti-infectives (From admission, onward)    None       Assessment/Plan Idiopathic Pancreatitis  KLA BILY is a 63  y.o. Spanish-speaking female who has a recurrent history of pancreatitis dating back to 2016. MRCP findings compatible with acute pancreatitis with no focal loculated fluid collections identified to suggest pseudocyst but with sludge within the gallbladder but no gallstones and no signs of choledocholithiasis. LFTs are within normal limits, lipase elevated at 71, WBC normal.   On physical exam, patient is exquisitely tender to palpation in the epigastric region which is consistent with pancreatitis. This will delay laparoscopic cholecystectomy until tenderness has decreased significantly.   Patient in NAD and does not require immediate surgical intervention. Will consult with my attending for further guidance.     FEN -NPO, IVF  VTE -SCDs ID -not indicated at this time Foley -none Dispo - Admit to [facility].   I reviewed nursing notes, hospitalist notes, last 24 h vitals and pain scores, last 48 h intake and output, last 24 h labs and trends, and last 24 h imaging results.  This care required high  level of medical decision making.   Eulah Hammonds, [facility] 09/05/2023, 3:27 PM Please see Amion for pager number during day hours 7:00am-4:30pm

## 2023-09-06 ENCOUNTER — Inpatient Hospital Stay (HOSPITAL_COMMUNITY): Payer: MEDICAID

## 2023-09-06 LAB — BASIC METABOLIC PANEL WITH GFR
Anion gap: 5 (ref 5–15)
BUN: 5 mg/dL — ABNORMAL LOW (ref 8–23)
CO2: 26 mmol/L (ref 22–32)
Calcium: 8.3 mg/dL — ABNORMAL LOW (ref 8.9–10.3)
Chloride: 102 mmol/L (ref 98–111)
Creatinine, Ser: 0.77 mg/dL (ref 0.44–1.00)
GFR, Estimated: >60 mL/min (ref >60)
Glucose, Bld: 142 mg/dL — ABNORMAL HIGH (ref 70–99)
Potassium: 3.6 mmol/L (ref 3.5–5.1)
Sodium: 133 mmol/L — ABNORMAL LOW (ref 135–145)

## 2023-09-06 LAB — HEPATITIS B CORE ANTIBODY, TOTAL: HEP B CORE AB: NEGATIVE

## 2023-09-06 LAB — MAGNESIUM: Magnesium: 1.9 mg/dL (ref 1.7–2.4)

## 2023-09-06 LAB — ALPHA-1-ANTITRYPSIN: A-1 Antitrypsin, Ser: 181 mg/dL (ref 101–187)

## 2023-09-06 MED ORDER — MORPHINE SULFATE (PF) 2 MG/ML IV SOLN
2.0000 mg | Freq: Once | INTRAVENOUS | Status: AC
  Administered 2023-09-06: 2 mg via INTRAVENOUS
  Filled 2023-09-06: qty 1

## 2023-09-06 MED ORDER — TECHNETIUM TC 99M MEBROFENIN IV KIT
5.0000 | PACK | Freq: Once | INTRAVENOUS | Status: AC | PRN
  Administered 2023-09-06: 5 via INTRAVENOUS

## 2023-09-06 MED ORDER — LACTATED RINGERS IV SOLN: INTRAVENOUS | Status: AC

## 2023-09-06 NOTE — Hospital Course (Addendum)
 63 y.o. female with history of recurrent pancreatitis of unknown cause the last time patient had an attack of pancreatitis was in January 2020 presented to the ER with complaints of severe abdominal pain with multiple episodes of nausea vomiting.  She was found to have elevated lipase level.  She was hospitalized for further management.     Assessment and Plan:   Acute pancreatitis - Etiology unclear.  Lipase greater than 2800 on presentation with MRCP confirming acute pancreatitis.  GI consulted and following closely.  Continue IV fluid hydration, pain control.  General surgery consulted for possible gallbladder etiology.  HIDA scan negative.  Patient feeling improved, tolerating liquid diet..     Concern for cholecystitis - MRCP noting gallbladder sludge.  HIDA scan negative.  Cholecystitis ruled out however cholecystectomy may still be warranted.  General surgery consulted.  S/P Cholecystectomy this am.     Hypokalemia - Replenishment on board.  Recheck BMP in AM.

## 2023-09-06 NOTE — Progress Notes (Signed)
 ON call-Md notified regarding urinary symptoms; low grade temp tonight. And upon chart review- UA in ED that indicated +UTI +leukocytes +nitrates No antibiotics received during stay. Concern for UTI/ infection given cloudy malodorous urine and new onset of headache; low grade temp tonight 100.0 and tylenol  given for headache with improvement in temp at 0000 vitals.    Pt and granddaughter do report that she has increased frequency that has been ongoing prior to admit and pelvic pressure with intermittent difficulty urinating.   On call MD will  order CBC, no urine to be sent at this time and defer to day shift as they are more familiar with patient

## 2023-09-06 NOTE — Plan of Care (Signed)

## 2023-09-06 NOTE — Progress Notes (Signed)
 Daily Progress Note  DOA: 09/03/2023 Hospital Day: 4   Cc: Recurrent acute pancreatitis  ASSESSMENT    63 year old Spanish-speaking female with recurrent acute pancreatitis ( 4th episode) of unclear etiology.  Current and previous extensive evaluations have been unrevealing.    >>>TODAY: Normal HIDA .  Still having periods of significant upper abdominal pain radiating around sides to back.  Pain meds help . Still very tender on exam .  Taking very little of CLD though was n.p.o. part of the day for the HIDA scan. Family in room and says she seems to be urinating adequate amounts. Afebrile, WBC normal.  Normal renal function.     Active Problems:   Acute pancreatitis   Hypokalemia   PLAN   -- LR infusion order expired.  Will reorder at 75 mL/ hour until p.o. intake improved -- Surgery evaluating for consideration of empiric cholecystectomy -- Continue supportive care  Subjective   Taking very little of the CLD Earlier today she had significant upper abdominal pain radiating around both sides into her back.  Feels better now after pain meds  Objective   GI Studies:   HIDA Initial non filling of the gallbladder but after administration of morphine  there was prompt filling of the gallbladder indicating cystic duct patency.   Recent Labs    09/04/23 0058 09/05/23 0333  WBC 6.6 7.7  HGB 12.6 12.0  HCT 39.1 36.1  MCV 89.5 88.3  PLT 264 221   No results for input(s): FOLATE, VITAMINB12, FERRITIN, TIBC, IRONPCTSAT in the last 72 hours. Recent Labs    09/04/23 0058 09/05/23 0333 09/06/23 0212  NA 140 136 133*  K 4.4 3.9 3.6  CL 106 103 102  CO2 22 25 26   GLUCOSE 139* 100* 142*  BUN 8 6* 5*  CREATININE 0.77 0.69 0.77  CALCIUM 8.5* 8.4* 8.3*   Recent Labs    09/04/23 0058 09/05/23 0333  PROT 6.1* 5.7*  ALBUMIN 3.3* 3.0*  AST 27 20  ALT 26 21  ALKPHOS 77 64  BILITOT 0.6 0.9    Imaging:  NM Hepato W/EF CLINICAL DATA:  Right upper  quadrant abdominal pain  EXAM: NUCLEAR MEDICINE HEPATOBILIARY IMAGING  TECHNIQUE: Sequential images of the abdomen were obtained out to 60 minutes following intravenous administration of radiopharmaceutical.  RADIOPHARMACEUTICALS:  Five mCi Tc-65m  Choletec IV  COMPARISON:  MRCP 09/05/2023  FINDINGS: Satisfactory uptake of radiopharmaceutical from the blood pool. Biliary activity at 8 minutes with bowel activity visible 14 minutes, indicating common bile duct patency. Small incidental amount of radiopharmaceutical reflux into the stomach.  Initial non filling of the gallbladder. We administered the patient 2 mg of morphine  to cause contraction of the sphincter of OD. Subsequent prompt filling of the gallbladder observed indicating cystic duct patency.  IMPRESSION: 1. Normal hepatobiliary scan, with normal hepatocellular uptake of radiopharmaceutical and patent cystic duct and common bile duct.  Electronically Signed   By: Ryan Salvage M.D.   On: 09/06/2023 13:37     Scheduled inpatient medications:   heparin   5,000 Units Subcutaneous Q8H   Continuous inpatient infusions:  PRN inpatient medications: HYDROmorphone  (DILAUDID ) injection  Vital signs in last 24 hours: Temp:  [98.9 F (37.2 C)-99.6 F (37.6 C)] 98.9 F (37.2 C) (06/25 0752) Pulse Rate:  [57-88] 77 (06/25 0752) Resp:  [18-20] 18 (06/25 0752) BP: (125-135)/(69-75) 135/75 (06/25 0752) SpO2:  [93 %-94 %] 93 % (06/25 0752) Last BM Date : 09/03/23  Intake/Output Summary (Last 24  hours) at 09/06/2023 1537 Last data filed at 09/06/2023 0306 Gross per 24 hour  Intake 1812.5 ml  Output --  Net 1812.5 ml    Intake/Output from previous day: 06/24 0701 - 06/25 0700 In: 1812.5 [P.O.:360; I.V.:1452.5] Out: -  Intake/Output this shift: No intake/output data recorded.   Physical Exam:  General: Alert female in NAD Heart:  Regular rate and rhythm.  Pulmonary: Normal respiratory effort Abdomen: Soft,  moderate generalized upper abdominal tenderness .  A few bowel sounds present  Extremities: No lower extremity edema  Neurologic: Alert and oriented Psych: Pleasant. Cooperative     LOS: 2 days   Vina Dasen ,NP 09/06/2023, 3:37 PM

## 2023-09-06 NOTE — Progress Notes (Signed)
 Progress Note   Patient: Evelyn Dillon FMW:[id number] DOB: 1960/09/12 DOA: 09/03/2023  DOS: the patient was seen and examined on 09/06/2023   Brief hospital course:  63 y.o. female with history of recurrent pancreatitis of unknown cause the last time patient had an attack of pancreatitis was in January 2020 presented to the ER with complaints of severe abdominal pain with multiple episodes of nausea vomiting.  She was found to have elevated lipase level.  She was hospitalized for further management.    Assessment and Plan:  Acute pancreatitis - Etiology unclear.  Lipase greater than 2800 on presentation with MRCP confirming acute pancreatitis.  GI consulted and following closely.  Continue aggressive IV fluid hydration, pain control.  General surgery consulted for possible gallbladder etiology.  HIDA scan pending.  Likely that surgery will be postponed until resolution of pancreatitis.  Concern for cholecystitis - MRCP noting gallbladder sludge.  HIDA scan pending to evaluate for presence of cholecystitis.  General surgery consulted.  Cholecystectomy likely recommended in the setting of recurrent idiopathic pancreatitis.  Hypokalemia - Replenishment on board.  Recheck BMP in AM.   Subjective: Patient sitting up in bed this morning.  Still complaining of some abdominal pain, feeling so-so.  Still having persistent nausea.  No vomiting, chest pain, shortness of breath.  Physical Exam:  Vitals:   09/05/23 2036 09/05/23 2359 09/06/23 0411 09/06/23 0752  BP: 125/70 128/69 134/69 135/75  Pulse: 88 (!) 57 78 77  Resp: 20 20 20 18   Temp: 99.6 F (37.6 C) 99.1 F (37.3 C) 99.5 F (37.5 C) 98.9 F (37.2 C)  TempSrc:   Oral Oral  SpO2: 93% 94% 94% 93%  Weight:      Height:        GENERAL:  Alert, pleasant, no acute distress  HEENT:  EOMI CARDIOVASCULAR:  RRR, no murmurs appreciated RESPIRATORY:  Clear to auscultation, no wheezing, rales, or rhonchi GASTROINTESTINAL: Soft, mildly  diffusely tender mostly in epigastric region EXTREMITIES:  No LE edema bilaterally NEURO:  No new focal deficits appreciated SKIN:  No rashes noted PSYCH:  Appropriate mood and affect, anxious    Data Reviewed:  No new imaging results  Previous records (including but not limited to H&P, progress notes, nursing notes, TOC management) were reviewed in assessment of this patient.  Labs: CBC: Recent Labs  Lab 09/03/23 1345 09/04/23 0058 09/05/23 0333  WBC 11.4* 6.6 7.7  HGB 13.9 12.6 12.0  HCT 41.9 39.1 36.1  MCV 87.8 89.5 88.3  PLT 340 264 221   Basic Metabolic Panel: Recent Labs  Lab 09/03/23 1345 09/04/23 0058 09/05/23 0333 09/06/23 0212  NA 141 140 136 133*  K 3.3* 4.4 3.9 3.6  CL 106 106 103 102  CO2 23 22 25 26   GLUCOSE 137* 139* 100* 142*  BUN 11 8 6* 5*  CREATININE 0.99 0.77 0.69 0.77  CALCIUM 9.1 8.5* 8.4* 8.3*  MG  --   --  1.7 1.9   Liver Function Tests: Recent Labs  Lab 09/03/23 1345 09/04/23 0058 09/05/23 0333  AST 37 27 20  ALT 30 26 21   ALKPHOS 85 77 64  BILITOT 0.4 0.6 0.9  PROT 6.2* 6.1* 5.7*  ALBUMIN 4.0 3.3* 3.0*   CBG: No results for input(s): GLUCAP in the last 168 hours.  Scheduled Meds:  heparin   5,000 Units Subcutaneous Q8H    morphine  injection  2 mg Intravenous Once   Continuous Infusions: PRN Meds:.HYDROmorphone  (DILAUDID ) injection  Family Communication: Family at  bedside  Disposition: Status is: Inpatient Remains inpatient appropriate because: Pancreatitis     Time spent: 31 minutes  Length of inpatient stay: 2 days  Author: Carliss LELON Canales, DO 09/06/2023 11:26 AM  For on call review [email or web address].

## 2023-09-07 LAB — BASIC METABOLIC PANEL WITH GFR
Anion gap: 14 (ref 5–15)
BUN: <5 mg/dL — ABNORMAL LOW (ref 8–23)
CO2: 20 mmol/L — ABNORMAL LOW (ref 22–32)
Calcium: 8.4 mg/dL — ABNORMAL LOW (ref 8.9–10.3)
Chloride: 102 mmol/L (ref 98–111)
Creatinine, Ser: 0.59 mg/dL (ref 0.44–1.00)
GFR, Estimated: >60 mL/min (ref >60)
Glucose, Bld: 104 mg/dL — ABNORMAL HIGH (ref 70–99)
Potassium: 3.4 mmol/L — ABNORMAL LOW (ref 3.5–5.1)
Sodium: 136 mmol/L (ref 135–145)

## 2023-09-07 LAB — MAGNESIUM: Magnesium: 1.9 mg/dL (ref 1.7–2.4)

## 2023-09-07 LAB — CBC
HCT: 34.2 % — ABNORMAL LOW (ref 36.0–46.0)
Hemoglobin: 11.2 g/dL — ABNORMAL LOW (ref 12.0–15.0)
MCH: 28.5 pg (ref 26.0–34.0)
MCHC: 32.7 g/dL (ref 30.0–36.0)
MCV: 87 fL (ref 80.0–100.0)
Platelets: 213 10*3/uL (ref 150–400)
RBC: 3.93 MIL/uL (ref 3.87–5.11)
RDW: 12.5 % (ref 11.5–15.5)
WBC: 7.9 10*3/uL (ref 4.0–10.5)
nRBC: 0 % (ref 0.0–0.2)

## 2023-09-07 LAB — GLUCOSE, CAPILLARY: Glucose-Capillary: 90 mg/dL (ref 70–99)

## 2023-09-07 MED ORDER — MELATONIN 5 MG PO TABS: 5.0000 mg | ORAL_TABLET | Freq: Every evening | ORAL | Status: DC | PRN

## 2023-09-07 MED ORDER — OXYCODONE HCL 5 MG PO TABS
5.0000 mg | ORAL_TABLET | Freq: Four times a day (QID) | ORAL | Status: AC | PRN
  Administered 2023-09-08 (×3): 5 mg via ORAL
  Filled 2023-09-07 (×3): qty 1

## 2023-09-07 MED ORDER — SODIUM CHLORIDE 0.9 % IV SOLN
1.0000 g | Freq: Every day | INTRAVENOUS | Status: DC
  Administered 2023-09-07 – 2023-09-08 (×3): 1 g via INTRAVENOUS
  Filled 2023-09-07 (×3): qty 10

## 2023-09-07 MED ORDER — MORPHINE SULFATE (PF) 2 MG/ML IV SOLN
2.0000 mg | INTRAVENOUS | Status: AC | PRN
  Administered 2023-09-07 (×3): 2 mg via INTRAVENOUS
  Filled 2023-09-07 (×3): qty 1

## 2023-09-07 MED ORDER — ACETAMINOPHEN 325 MG PO TABS
650.0000 mg | ORAL_TABLET | Freq: Four times a day (QID) | ORAL | Status: DC | PRN
  Administered 2023-09-08: 650 mg via ORAL
  Filled 2023-09-07: qty 2

## 2023-09-07 MED ORDER — POLYETHYLENE GLYCOL 3350 17 G PO PACK
17.0000 g | PACK | Freq: Every day | ORAL | Status: DC | PRN
  Administered 2023-09-08: 17 g via ORAL
  Filled 2023-09-07: qty 1

## 2023-09-07 MED ORDER — CEFAZOLIN SODIUM-DEXTROSE 2-4 GM/100ML-% IV SOLN
2.0000 g | INTRAVENOUS | Status: AC
  Administered 2023-09-08: 2 g via INTRAVENOUS
  Filled 2023-09-07: qty 100

## 2023-09-07 MED ORDER — INDOCYANINE GREEN 25 MG IV SOLR
1.2500 mg | Freq: Once | INTRAVENOUS | Status: AC
  Administered 2023-09-08: 1.25 mg via INTRAVENOUS

## 2023-09-07 MED ORDER — PROCHLORPERAZINE EDISYLATE 10 MG/2ML IJ SOLN
5.0000 mg | Freq: Four times a day (QID) | INTRAMUSCULAR | Status: DC | PRN
  Administered 2023-09-07: 5 mg via INTRAVENOUS
  Filled 2023-09-07: qty 2

## 2023-09-07 MED ORDER — CEFAZOLIN SODIUM-DEXTROSE 2-4 GM/100ML-% IV SOLN
2.0000 g | INTRAVENOUS | Status: DC
  Filled 2023-09-07: qty 100

## 2023-09-07 NOTE — Progress Notes (Signed)
 Received a call from bedside RN regarding the patient having lower urinary tract symptoms.  Presented at bedside.  The patient is alert and oriented x 3.  She is accompanied in the room by her granddaughter.  She endorses dysuria, with onset 3 days prior to admission.  Associated with suprapubic discomfort and pressure.  UA positive for nitrite on admission.  Started Rocephin 1 g daily empirically for presumptive UTI, present on admission.   Time: 15 minutes.

## 2023-09-07 NOTE — Plan of Care (Signed)

## 2023-09-07 NOTE — Anesthesia Preprocedure Evaluation (Addendum)
 Anesthesia Evaluation  Patient identified by MRN, date of birth, ID band Patient awake    Reviewed: Allergy & Precautions, H&P , NPO status , Patient's Chart, lab work & pertinent test results  Airway Mallampati: II  TM Distance: >3 FB Neck ROM: Full    Dental  (+) Dental Advisory Given   Pulmonary neg pulmonary ROS   breath sounds clear to auscultation       Cardiovascular Exercise Tolerance: Good negative cardio ROS  Rhythm:Regular Rate:Normal     Neuro/Psych  PSYCHIATRIC DISORDERS  Depression    negative neurological ROS  negative psych ROS   GI/Hepatic negative GI ROS, Neg liver ROS,,,  Endo/Other    Class 3 obesity  Renal/GU Renal diseasenegative Renal ROS  negative genitourinary   Musculoskeletal   Abdominal   Peds  Hematology negative hematology ROS (+) Blood dyscrasia, anemia   Anesthesia Other Findings   Reproductive/Obstetrics negative OB ROS                             Anesthesia Physical Anesthesia Plan  ASA: 2  Anesthesia Plan: General   Post-op Pain Management: Minimal or no pain anticipated, Ofirmev  IV (intra-op)* and Toradol  IV (intra-op)*   Induction: Intravenous  PONV Risk Score and Plan: 3 and Ondansetron , Dexamethasone, Treatment may vary due to age or medical condition and Midazolam  Airway Management Planned: Oral ETT  Additional Equipment: None  Intra-op Plan:   Post-operative Plan: Extubation in OR  Informed Consent: I have reviewed the patients History and Physical, chart, labs and discussed the procedure including the risks, benefits and alternatives for the proposed anesthesia with the patient or authorized representative who has indicated his/her understanding and acceptance.       Plan Discussed with: Anesthesiologist and CRNA  Anesthesia Plan Comments:        Anesthesia Quick Evaluation

## 2023-09-07 NOTE — Progress Notes (Signed)
 Progress Note   Patient: Evelyn Dillon FMW:[id number] DOB: [birth year] DOA: 09/03/2023  DOS: the patient was seen and examined on 09/07/2023   Brief hospital course:  63 y.o. female with history of recurrent pancreatitis of unknown cause the last time patient had an attack of pancreatitis was in January 2020 presented to the ER with complaints of severe abdominal pain with multiple episodes of nausea vomiting.  She was found to have elevated lipase level.  She was hospitalized for further management.     Assessment and Plan:   Acute pancreatitis - Etiology unclear.  Lipase greater than 2800 on presentation with MRCP confirming acute pancreatitis.  GI consulted and following closely.  Continue IV fluid hydration, pain control.  General surgery consulted for possible gallbladder etiology.  HIDA scan negative.  Patient feeling improved this morning.    Concern for cholecystitis - MRCP noting gallbladder sludge.  HIDA scan negative.  Cholecystitis likely mild however cholecystectomy may still be warranted.  General surgery consulted.  Cholecystectomy likely recommended in the setting of recurrent idiopathic pancreatitis.   Hypokalemia - Replenishment on board.  Recheck BMP in AM.   Subjective: Patient feeling improved this morning.  Less abdominal pain and nausea.  Able to tolerate some sips of liquid.  Denies any fever, shortness of breath, chest pain.  Physical Exam:  Vitals:   09/06/23 2053 09/06/23 2300 09/07/23 0408 09/07/23 0733  BP: 125/65  124/65 133/75  Pulse: 73  72 74  Resp: 20  20 18   Temp: 100 F (37.8 C) 98.1 F (36.7 C) 98.6 F (37 C) 98.7 F (37.1 C)  TempSrc: Oral  Oral Oral  SpO2: 92%  97% 96%  Weight:      Height:        GENERAL:  Alert, pleasant, no acute distress  HEENT:  EOMI CARDIOVASCULAR:  RRR, no murmurs appreciated RESPIRATORY:  Clear to auscultation, no wheezing, rales, or rhonchi GASTROINTESTINAL: Soft, mildly diffusely tender mostly in epigastric  region EXTREMITIES:  No LE edema bilaterally NEURO:  No new focal deficits appreciated SKIN:  No rashes noted PSYCH:  Appropriate mood and affect, anxious   Data Reviewed:  No new imaging to review  Previous records (including but not limited to H&P, progress notes, nursing notes, TOC management) were reviewed in assessment of this patient.  Labs: CBC: Recent Labs  Lab 09/03/23 1345 09/04/23 0058 09/05/23 0333 09/07/23 0221  WBC 11.4* 6.6 7.7 7.9  HGB 13.9 12.6 12.0 11.2*  HCT 41.9 39.1 36.1 34.2*  MCV 87.8 89.5 88.3 87.0  PLT 340 264 221 213   Basic Metabolic Panel: Recent Labs  Lab 09/03/23 1345 09/04/23 0058 09/05/23 0333 09/06/23 0212 09/07/23 0221  NA 141 140 136 133* 136  K 3.3* 4.4 3.9 3.6 3.4*  CL 106 106 103 102 102  CO2 23 22 25 26  20*  GLUCOSE 137* 139* 100* 142* 104*  BUN 11 8 6* 5* <5*  CREATININE 0.99 0.77 0.69 0.77 0.59  CALCIUM 9.1 8.5* 8.4* 8.3* 8.4*  MG  --   --  1.7 1.9 1.9   Liver Function Tests: Recent Labs  Lab 09/03/23 1345 09/04/23 0058 09/05/23 0333  AST 37 27 20  ALT 30 26 21   ALKPHOS 85 77 64  BILITOT 0.4 0.6 0.9  PROT 6.2* 6.1* 5.7*  ALBUMIN 4.0 3.3* 3.0*   CBG: Recent Labs  Lab 09/07/23 0732  GLUCAP 90    Scheduled Meds:  heparin   5,000 Units Subcutaneous Q8H   Continuous  Infusions:  cefTRIAXone (ROCEPHIN)  IV Stopped (09/07/23 0213)   lactated ringers 75 mL/hr at 09/07/23 0230   PRN Meds:.acetaminophen , melatonin, morphine  injection, oxyCODONE, polyethylene glycol, prochlorperazine  Family Communication: Family at bedside  Disposition: Status is: Inpatient Remains inpatient appropriate because: Pancreatitis     Time spent: 33 minutes  Length of inpatient stay: 3 days  Author: Carliss LELON Canales, DO 09/07/2023 9:51 AM  For on call review [email or web address].

## 2023-09-07 NOTE — Progress Notes (Addendum)
 Patient ID: Evelyn Dillon, female   DOB: 1961/02/05, 63 y.o.   MRN: [id number]   Acute Care Surgery Service Progress Note:    Chief Complaint/Subjective: In person interpreter and grand daughter at [facility] Less abd pain, no n/v  Objective: Vital signs in last 24 hours: Temp:  [98.1 F (36.7 C)-100 F (37.8 C)] 98.7 F (37.1 C) (06/26 0733) Pulse Rate:  [72-85] 74 (06/26 0733) Resp:  [18-20] 18 (06/26 0733) BP: (124-160)/(53-75) 133/75 (06/26 0733) SpO2:  [92 %-97 %] 96 % (06/26 0733) Last BM Date : 09/03/23  Intake/Output from previous day: 06/25 0701 - 06/26 0700 In: 1418.1 [P.O.:360; I.V.:968.8; IV Piggyback:89.4] Out: -  Intake/Output this shift: No intake/output data recorded.  Lungs: cta, nonlabored  Cardiovascular: reg  Abd: soft min TTP, no guarding/rebound  Extremities: no edema, +SCDs  Neuro: alert, nonfocal  Lab Results: CBC  Recent Labs    09/05/23 0333 09/07/23 0221  WBC 7.7 7.9  HGB 12.0 11.2*  HCT 36.1 34.2*  PLT 221 213   BMET Recent Labs    09/06/23 0212 09/07/23 0221  NA 133* 136  K 3.6 3.4*  CL 102 102  CO2 26 20*  GLUCOSE 142* 104*  BUN 5* <5*  CREATININE 0.77 0.59  CALCIUM 8.3* 8.4*   LFT    Latest Ref Rng & Units 09/05/2023    3:33 AM 09/04/2023   12:58 AM 09/03/2023    1:45 PM  Hepatic Function  Total Protein 6.5 - 8.1 g/dL 5.7  6.1  6.2   Albumin 3.5 - 5.0 g/dL 3.0  3.3  4.0   AST 15 - 41 U/L 20  27  37   ALT 0 - 44 U/L 21  26  30    Alk Phosphatase 38 - 126 U/L 64  77  85   Total Bilirubin 0.0 - 1.2 mg/dL 0.9  0.6  0.4    PT/INR No results for input(s): LABPROT, INR in the last 72 hours. ABG No results for input(s): PHART, HCO3 in the last 72 hours.  Invalid input(s): PCO2, PO2  Studies/Results:  Anti-infectives: Anti-infectives (From admission, onward)    Start     Dose/Rate Route Frequency Ordered Stop   09/07/23 0100  cefTRIAXone (ROCEPHIN) 1 g in sodium chloride  0.9 % 100 mL IVPB        1 g 200  mL/hr over 30 Minutes Intravenous Daily at bedtime 09/07/23 0045         Medications: Scheduled Meds:  heparin   5,000 Units Subcutaneous Q8H   indocyanine green  1.25 mg Intravenous Once   Continuous Infusions:  cefTRIAXone (ROCEPHIN)  IV Stopped (09/07/23 0213)   lactated ringers 75 mL/hr at 09/07/23 0230   PRN Meds:.acetaminophen , melatonin, morphine  injection, oxyCODONE, polyethylene glycol, prochlorperazine  Assessment/Plan: Patient Active Problem List   Diagnosis Date Noted   Acute pancreatitis 09/03/2023   Hypokalemia 09/03/2023   Urge incontinence 05/03/2018   Hyperglycemia 04/02/2018   Obesity (BMI 30.0-34.9) 04/02/2018   Vitamin D  deficiency 03/15/2017   Encounter for annual physical exam 12/12/2016   Adjustment disorder with depressed mood 12/12/2016   Non-English speaking patient 09/14/2016   Recurrent pancreatitis    Major depressive disorder, recurrent episode (HCC) 01/13/2015   Upper abdominal pain    Facial trauma 05/07/2014   Syncope 05/06/2014   ANEMIA, IRON DEFICIENCY, UNSPEC. 05/11/2006   Idiopathic Pancreatitis   Evelyn Dillon is a 63 y.o. Spanish-speaking female who has a recurrent history of pancreatitis dating back to 2016.  MRCP findings compatible with acute pancreatitis with no focal loculated fluid collections identified to suggest pseudocyst but with sludge within the gallbladder but no gallstones and no signs of choledocholithiasis. LFTs are within normal limits, lipase elevated at 71, WBC normal.    I do not think it is unreasonable to offer this patient cholecystectomy since she does have sludge in her gallbladder to rule out a biliary aspect as a source of her recurrent pancreatitis although we cannot guarantee that cholecystectomy will ameliorate future episodes of pancreatitis which was discussed with pt. She doesn't have other risk factors for pancreatitis  Her pain is significantly improved so I think we can proceed with Lap chole today.    With the aid of the in person interpreter -     We discussed gallbladder disease. The patient was given educational material in [place]. We discussed non-operative and operative management. We discussed the signs & symptoms of acute cholecystitis  I discussed laparoscopic cholecystectomy with IOC in detail.  The patient was given educational material as well as diagrams detailing the procedure.  We discussed the risks and benefits of a laparoscopic cholecystectomy including, but not limited to bleeding, infection, injury to surrounding structures such as the intestine or liver, bile leak, retained gallstones, need to convert to an open procedure, prolonged diarrhea, blood clots such as  DVT, common bile duct injury, anesthesia risks, and possible need for additional procedures.  We discussed the typical post-operative recovery course. I explained that the likelihood of improvement of their symptoms is fair to good.      FEN -NPO, IVF  VTE -SCDs ID -not indicated at this time Foley -none Dispo - Admit to [facility].  Disposition: to OR for lap chole  I reviewed nursing notes, hospitalist notes, last 24 h vitals and pain scores, last 48 h intake and output, last 24 h labs and trends, and last 24 h imaging results.   This care required high  level of medical decision making.   LOS: 3 days    Evelyn HERO. Tanda, MD, FACS General, Bariatric, & Minimally Invasive Surgery [phone] [facility], [facility]

## 2023-09-07 NOTE — Progress Notes (Signed)
 Daily Progress Note  DOA: 09/03/2023 Hospital Day: 5   Cc: Recurrent acute pancreatitis, cause unclear   ASSESSMENT    63 year old Spanish-speaking female with recurrent acute pancreatitis ( 4th episode) of unclear etiology.  Current and previous extensive evaluations have been unrevealing.    TODAY>> WBC normal. Renal function normal. Based on I/0 she took in only 360 ml PO yesterday. Today had some applesauce. Currently n.p.o. for cholecystectomy todayNo significant abdominal pain since around midnight,  relieved after dose of pain meds.     Active Problems:   Acute pancreatitis   Hypokalemia   PLAN   --Continue maintenance IVF --Will see again tomorrow after surgery. When Surgery clears will need to try to advance diet and see if she can tolerate. Otherwise will need postpyloric feedings.   Subjective   No significant abdominal pain since around midnight,  relieved after dose of pain meds.  Currently n.p.o. for cholecystectomy today.  Had BM this a.m. per family member who is serving as Nurse, learning disability.    Objective   GI Studies:    Recent Labs    09/05/23 0333 09/07/23 0221  WBC 7.7 7.9  HGB 12.0 11.2*  HCT 36.1 34.2*  MCV 88.3 87.0  PLT 221 213   No results for input(s): FOLATE, VITAMINB12, FERRITIN, TIBC, IRONPCTSAT in the last 72 hours. Recent Labs    09/05/23 0333 09/06/23 0212 09/07/23 0221  NA 136 133* 136  K 3.9 3.6 3.4*  CL 103 102 102  CO2 25 26 20*  GLUCOSE 100* 142* 104*  BUN 6* 5* <5*  CREATININE 0.69 0.77 0.59  CALCIUM 8.4* 8.3* 8.4*   Recent Labs    09/05/23 0333  PROT 5.7*  ALBUMIN 3.0*  AST 20  ALT 21  ALKPHOS 64  BILITOT 0.9    Imaging:  NM Hepato W/EF CLINICAL DATA:  Right upper quadrant abdominal pain  EXAM: NUCLEAR MEDICINE HEPATOBILIARY IMAGING  TECHNIQUE: Sequential images of the abdomen were obtained out to 60 minutes following intravenous administration of  radiopharmaceutical.  RADIOPHARMACEUTICALS:  Five mCi Tc-27m  Choletec IV  COMPARISON:  MRCP 09/05/2023  FINDINGS: Satisfactory uptake of radiopharmaceutical from the blood pool. Biliary activity at 8 minutes with bowel activity visible 14 minutes, indicating common bile duct patency. Small incidental amount of radiopharmaceutical reflux into the stomach.  Initial non filling of the gallbladder. We administered the patient 2 mg of morphine  to cause contraction of the sphincter of OD. Subsequent prompt filling of the gallbladder observed indicating cystic duct patency.  IMPRESSION: 1. Normal hepatobiliary scan, with normal hepatocellular uptake of radiopharmaceutical and patent cystic duct and common bile duct.  Electronically Signed   By: Ryan Salvage M.D.   On: 09/06/2023 13:37     Scheduled inpatient medications:   heparin   5,000 Units Subcutaneous Q8H   indocyanine green  1.25 mg Intravenous Once   Continuous inpatient infusions:    ceFAZolin (ANCEF) IV     cefTRIAXone (ROCEPHIN)  IV Stopped (09/07/23 0213)   lactated ringers 75 mL/hr at 09/07/23 0230   PRN inpatient medications: acetaminophen , melatonin, morphine  injection, oxyCODONE, polyethylene glycol, prochlorperazine  Vital signs in last 24 hours: Temp:  [98.1 F (36.7 C)-100 F (37.8 C)] 98.7 F (37.1 C) (06/26 0733) Pulse Rate:  [72-85] 74 (06/26 0733) Resp:  [18-20] 18 (06/26 0733) BP: (124-160)/(53-75) 133/75 (06/26 0733) SpO2:  [92 %-97 %] 96 % (06/26 0733) Last BM Date : 09/03/23  Intake/Output Summary (Last 24 hours) at 09/07/2023 1014  Last data filed at 09/07/2023 0500 Gross per 24 hour  Intake 1418.14 ml  Output --  Net 1418.14 ml    Intake/Output from previous day: 06/25 0701 - 06/26 0700 In: 1418.1 [P.O.:360; I.V.:968.8; IV Piggyback:89.4] Out: -  Intake/Output this shift: No intake/output data recorded.   Physical Exam:  General: Alert female in NAD Heart:  Regular rate and  rhythm.  Pulmonary: Normal respiratory effort Abdomen: Soft, nondistended, mild generalized upper abdominal tenderness . A few bowel sounds. Extremities: No lower extremity edema  Neurologic: Alert and oriented Psych: Pleasant. Cooperative     LOS: 3 days   Vina Dasen ,NP 09/07/2023, 10:14 AM

## 2023-09-07 NOTE — H&P (View-Only) (Signed)
 Patient ID: Evelyn Dillon, female   DOB: 1961/02/05, 63 y.o.   MRN: [id number]   Acute Care Surgery Service Progress Note:    Chief Complaint/Subjective: In person interpreter and grand daughter at [facility] Less abd pain, no n/v  Objective: Vital signs in last 24 hours: Temp:  [98.1 F (36.7 C)-100 F (37.8 C)] 98.7 F (37.1 C) (06/26 0733) Pulse Rate:  [72-85] 74 (06/26 0733) Resp:  [18-20] 18 (06/26 0733) BP: (124-160)/(53-75) 133/75 (06/26 0733) SpO2:  [92 %-97 %] 96 % (06/26 0733) Last BM Date : 09/03/23  Intake/Output from previous day: 06/25 0701 - 06/26 0700 In: 1418.1 [P.O.:360; I.V.:968.8; IV Piggyback:89.4] Out: -  Intake/Output this shift: No intake/output data recorded.  Lungs: cta, nonlabored  Cardiovascular: reg  Abd: soft min TTP, no guarding/rebound  Extremities: no edema, +SCDs  Neuro: alert, nonfocal  Lab Results: CBC  Recent Labs    09/05/23 0333 09/07/23 0221  WBC 7.7 7.9  HGB 12.0 11.2*  HCT 36.1 34.2*  PLT 221 213   BMET Recent Labs    09/06/23 0212 09/07/23 0221  NA 133* 136  K 3.6 3.4*  CL 102 102  CO2 26 20*  GLUCOSE 142* 104*  BUN 5* <5*  CREATININE 0.77 0.59  CALCIUM 8.3* 8.4*   LFT    Latest Ref Rng & Units 09/05/2023    3:33 AM 09/04/2023   12:58 AM 09/03/2023    1:45 PM  Hepatic Function  Total Protein 6.5 - 8.1 g/dL 5.7  6.1  6.2   Albumin 3.5 - 5.0 g/dL 3.0  3.3  4.0   AST 15 - 41 U/L 20  27  37   ALT 0 - 44 U/L 21  26  30    Alk Phosphatase 38 - 126 U/L 64  77  85   Total Bilirubin 0.0 - 1.2 mg/dL 0.9  0.6  0.4    PT/INR No results for input(s): LABPROT, INR in the last 72 hours. ABG No results for input(s): PHART, HCO3 in the last 72 hours.  Invalid input(s): PCO2, PO2  Studies/Results:  Anti-infectives: Anti-infectives (From admission, onward)    Start     Dose/Rate Route Frequency Ordered Stop   09/07/23 0100  cefTRIAXone (ROCEPHIN) 1 g in sodium chloride  0.9 % 100 mL IVPB        1 g 200  mL/hr over 30 Minutes Intravenous Daily at bedtime 09/07/23 0045         Medications: Scheduled Meds:  heparin   5,000 Units Subcutaneous Q8H   indocyanine green  1.25 mg Intravenous Once   Continuous Infusions:  cefTRIAXone (ROCEPHIN)  IV Stopped (09/07/23 0213)   lactated ringers 75 mL/hr at 09/07/23 0230   PRN Meds:.acetaminophen , melatonin, morphine  injection, oxyCODONE, polyethylene glycol, prochlorperazine  Assessment/Plan: Patient Active Problem List   Diagnosis Date Noted   Acute pancreatitis 09/03/2023   Hypokalemia 09/03/2023   Urge incontinence 05/03/2018   Hyperglycemia 04/02/2018   Obesity (BMI 30.0-34.9) 04/02/2018   Vitamin D  deficiency 03/15/2017   Encounter for annual physical exam 12/12/2016   Adjustment disorder with depressed mood 12/12/2016   Non-English speaking patient 09/14/2016   Recurrent pancreatitis    Major depressive disorder, recurrent episode (HCC) 01/13/2015   Upper abdominal pain    Facial trauma 05/07/2014   Syncope 05/06/2014   ANEMIA, IRON DEFICIENCY, UNSPEC. 05/11/2006   Idiopathic Pancreatitis   Evelyn Dillon is a 63 y.o. Spanish-speaking female who has a recurrent history of pancreatitis dating back to 2016.  MRCP findings compatible with acute pancreatitis with no focal loculated fluid collections identified to suggest pseudocyst but with sludge within the gallbladder but no gallstones and no signs of choledocholithiasis. LFTs are within normal limits, lipase elevated at 71, WBC normal.    I do not think it is unreasonable to offer this patient cholecystectomy since she does have sludge in her gallbladder to rule out a biliary aspect as a source of her recurrent pancreatitis although we cannot guarantee that cholecystectomy will ameliorate future episodes of pancreatitis which was discussed with pt. She doesn't have other risk factors for pancreatitis  Her pain is significantly improved so I think we can proceed with Lap chole today.    With the aid of the in person interpreter -     We discussed gallbladder disease. The patient was given educational material in [place]. We discussed non-operative and operative management. We discussed the signs & symptoms of acute cholecystitis  I discussed laparoscopic cholecystectomy with IOC in detail.  The patient was given educational material as well as diagrams detailing the procedure.  We discussed the risks and benefits of a laparoscopic cholecystectomy including, but not limited to bleeding, infection, injury to surrounding structures such as the intestine or liver, bile leak, retained gallstones, need to convert to an open procedure, prolonged diarrhea, blood clots such as  DVT, common bile duct injury, anesthesia risks, and possible need for additional procedures.  We discussed the typical post-operative recovery course. I explained that the likelihood of improvement of their symptoms is fair to good.      FEN -NPO, IVF  VTE -SCDs ID -not indicated at this time Foley -none Dispo - Admit to [facility].  Disposition: to OR for lap chole  I reviewed nursing notes, hospitalist notes, last 24 h vitals and pain scores, last 48 h intake and output, last 24 h labs and trends, and last 24 h imaging results.   This care required high  level of medical decision making.   LOS: 3 days    Evelyn Dillon. Tanda, MD, FACS General, Bariatric, & Minimally Invasive Surgery [phone] [facility], A [facility]

## 2023-09-07 NOTE — Progress Notes (Addendum)
 Despite order for NPO, the patient was brought applesauce this morning at 0800am by someone.  Surgery would not be for 8 hrs which is too late today.  Will allow FLD today and NPO p MN tomorrow for OR tomorrow.  Burnard FORBES Banter 11:06 AM  09/07/2023   Camellia HERO. Tanda, MD, FACS General, Bariatric, & Minimally Invasive Surgery [facility],  A [facility]

## 2023-09-08 ENCOUNTER — Encounter (HOSPITAL_COMMUNITY): Payer: Self-pay | Admitting: Internal Medicine

## 2023-09-08 ENCOUNTER — Encounter (HOSPITAL_COMMUNITY)
Admission: EM | Disposition: A | Discharge status: Home / Self Care | Payer: Self-pay | Source: Home / Self Care | Attending: Internal Medicine

## 2023-09-08 ENCOUNTER — Inpatient Hospital Stay (HOSPITAL_COMMUNITY): Payer: MEDICAID

## 2023-09-08 ENCOUNTER — Other Ambulatory Visit: Payer: Self-pay

## 2023-09-08 ENCOUNTER — Inpatient Hospital Stay (HOSPITAL_COMMUNITY): Payer: MEDICAID | Admitting: Anesthesiology

## 2023-09-08 DIAGNOSIS — K861 Other chronic pancreatitis: Secondary | ICD-10-CM

## 2023-09-08 DIAGNOSIS — Z9049 Acquired absence of other specified parts of digestive tract: Secondary | ICD-10-CM

## 2023-09-08 HISTORY — PX: CHOLECYSTECTOMY: SHX55

## 2023-09-08 LAB — BASIC METABOLIC PANEL WITH GFR
Anion gap: 12 (ref 5–15)
BUN: 5 mg/dL — ABNORMAL LOW (ref 8–23)
CO2: 24 mmol/L (ref 22–32)
Calcium: 8.7 mg/dL — ABNORMAL LOW (ref 8.9–10.3)
Chloride: 101 mmol/L (ref 98–111)
Creatinine, Ser: 0.63 mg/dL (ref 0.44–1.00)
GFR, Estimated: >60 mL/min (ref >60)
Glucose, Bld: 99 mg/dL (ref 70–99)
Potassium: 3.3 mmol/L — ABNORMAL LOW (ref 3.5–5.1)
Sodium: 137 mmol/L (ref 135–145)

## 2023-09-08 LAB — CBC
HCT: 33.4 % — ABNORMAL LOW (ref 36.0–46.0)
Hemoglobin: 11.1 g/dL — ABNORMAL LOW (ref 12.0–15.0)
MCH: 28.7 pg (ref 26.0–34.0)
MCHC: 33.2 g/dL (ref 30.0–36.0)
MCV: 86.3 fL (ref 80.0–100.0)
Platelets: 255 10*3/uL (ref 150–400)
RBC: 3.87 MIL/uL (ref 3.87–5.11)
RDW: 12.7 % (ref 11.5–15.5)
WBC: 6.2 10*3/uL (ref 4.0–10.5)
nRBC: 0 % (ref 0.0–0.2)

## 2023-09-08 SURGERY — LAPAROSCOPIC CHOLECYSTECTOMY WITH INTRAOPERATIVE CHOLANGIOGRAM
Anesthesia: General

## 2023-09-08 MED ORDER — DEXAMETHASONE SODIUM PHOSPHATE 10 MG/ML IJ SOLN
INTRAMUSCULAR | Status: DC | PRN
  Administered 2023-09-08: 5 mg via INTRAVENOUS

## 2023-09-08 MED ORDER — CEFAZOLIN SODIUM-DEXTROSE 2-4 GM/100ML-% IV SOLN
INTRAVENOUS | Status: AC
  Filled 2023-09-08: qty 100

## 2023-09-08 MED ORDER — OXYCODONE HCL 5 MG/5ML PO SOLN: 5.0000 mg | Freq: Once | ORAL | Status: DC | PRN

## 2023-09-08 MED ORDER — BUPIVACAINE-EPINEPHRINE 0.25% -1:200000 IJ SOLN
INTRAMUSCULAR | Status: DC | PRN
  Administered 2023-09-08: 25 mL

## 2023-09-08 MED ORDER — ROCURONIUM BROMIDE 10 MG/ML (PF) SYRINGE
PREFILLED_SYRINGE | INTRAVENOUS | Status: DC | PRN
  Administered 2023-09-08: 30 mg via INTRAVENOUS
  Administered 2023-09-08: 10 mg via INTRAVENOUS

## 2023-09-08 MED ORDER — MEPERIDINE HCL 25 MG/ML IJ SOLN: 6.2500 mg | INTRAMUSCULAR | Status: DC | PRN

## 2023-09-08 MED ORDER — LIDOCAINE 2% (20 MG/ML) 5 ML SYRINGE
INTRAMUSCULAR | Status: DC | PRN
  Administered 2023-09-08: 100 mg via INTRAVENOUS

## 2023-09-08 MED ORDER — CHLORHEXIDINE GLUCONATE 0.12 % MT SOLN: 15.0000 mL | Freq: Once | OROMUCOSAL | Status: AC

## 2023-09-08 MED ORDER — SODIUM CHLORIDE 0.9 % IV SOLN
INTRAVENOUS | Status: DC | PRN
  Administered 2023-09-08: 16 mL

## 2023-09-08 MED ORDER — HEPARIN SODIUM (PORCINE) 5000 UNIT/ML IJ SOLN
5000.0000 [IU] | Freq: Three times a day (TID) | INTRAMUSCULAR | Status: DC
  Administered 2023-09-09 (×2): 5000 [IU] via SUBCUTANEOUS
  Filled 2023-09-08 (×2): qty 1

## 2023-09-08 MED ORDER — FENTANYL CITRATE (PF) 250 MCG/5ML IJ SOLN
INTRAMUSCULAR | Status: AC
Start: 2023-09-08 — End: 2023-09-08
  Filled 2023-09-08: qty 5

## 2023-09-08 MED ORDER — PHENYLEPHRINE HCL-NACL 20-0.9 MG/250ML-% IV SOLN
INTRAVENOUS | Status: DC | PRN
  Administered 2023-09-08: 25 ug/min via INTRAVENOUS

## 2023-09-08 MED ORDER — OXYCODONE HCL 5 MG PO TABS: 5.0000 mg | ORAL_TABLET | Freq: Once | ORAL | Status: DC | PRN

## 2023-09-08 MED ORDER — MORPHINE SULFATE (PF) 2 MG/ML IV SOLN: 1.0000 mg | INTRAVENOUS | Status: DC | PRN

## 2023-09-08 MED ORDER — PROPOFOL 10 MG/ML IV BOLUS
INTRAVENOUS | Status: AC
  Filled 2023-09-08: qty 20

## 2023-09-08 MED ORDER — MIDAZOLAM HCL 2 MG/2ML IJ SOLN
INTRAMUSCULAR | Status: DC | PRN
  Administered 2023-09-08: 2 mg via INTRAVENOUS

## 2023-09-08 MED ORDER — FENTANYL CITRATE (PF) 250 MCG/5ML IJ SOLN
INTRAMUSCULAR | Status: DC | PRN
  Administered 2023-09-08 (×2): 50 ug via INTRAVENOUS

## 2023-09-08 MED ORDER — PROPOFOL 10 MG/ML IV BOLUS
INTRAVENOUS | Status: DC | PRN
  Administered 2023-09-08: 150 mg via INTRAVENOUS

## 2023-09-08 MED ORDER — CHLORHEXIDINE GLUCONATE 0.12 % MT SOLN
OROMUCOSAL | Status: AC
  Administered 2023-09-08: 15 mL via OROMUCOSAL
  Filled 2023-09-08: qty 15

## 2023-09-08 MED ORDER — DEXAMETHASONE SODIUM PHOSPHATE 10 MG/ML IJ SOLN
INTRAMUSCULAR | Status: AC
  Filled 2023-09-08: qty 1

## 2023-09-08 MED ORDER — ORAL CARE MOUTH RINSE: 15.0000 mL | Freq: Once | OROMUCOSAL | Status: AC

## 2023-09-08 MED ORDER — LACTATED RINGERS IV SOLN: INTRAVENOUS | Status: DC

## 2023-09-08 MED ORDER — SODIUM CHLORIDE 0.9 % IR SOLN
Status: DC | PRN
  Administered 2023-09-08: 1

## 2023-09-08 MED ORDER — SUGAMMADEX SODIUM 200 MG/2ML IV SOLN
INTRAVENOUS | Status: DC | PRN
  Administered 2023-09-08 (×2): 100 mg via INTRAVENOUS
  Administered 2023-09-08: 50 mg via INTRAVENOUS

## 2023-09-08 MED ORDER — 0.9 % SODIUM CHLORIDE (POUR BTL) OPTIME
TOPICAL | Status: DC | PRN
  Administered 2023-09-08: 1000 mL

## 2023-09-08 MED ORDER — ACETAMINOPHEN 500 MG PO TABS
1000.0000 mg | ORAL_TABLET | Freq: Four times a day (QID) | ORAL | Status: DC
  Administered 2023-09-08 – 2023-09-09 (×5): 1000 mg via ORAL
  Filled 2023-09-08 (×5): qty 2

## 2023-09-08 MED ORDER — MIDAZOLAM HCL 2 MG/2ML IJ SOLN
INTRAMUSCULAR | Status: AC
Start: 2023-09-08 — End: 2023-09-08
  Filled 2023-09-08: qty 2

## 2023-09-08 MED ORDER — ROCURONIUM BROMIDE 10 MG/ML (PF) SYRINGE
PREFILLED_SYRINGE | INTRAVENOUS | Status: AC
  Filled 2023-09-08: qty 10

## 2023-09-08 MED ORDER — ONDANSETRON HCL 4 MG/2ML IJ SOLN
INTRAMUSCULAR | Status: AC
  Filled 2023-09-08: qty 2

## 2023-09-08 MED ORDER — BUPIVACAINE-EPINEPHRINE (PF) 0.25% -1:200000 IJ SOLN
INTRAMUSCULAR | Status: AC
  Filled 2023-09-08: qty 30

## 2023-09-08 MED ORDER — FENTANYL CITRATE (PF) 100 MCG/2ML IJ SOLN: 25.0000 ug | INTRAMUSCULAR | Status: DC | PRN

## 2023-09-08 MED ORDER — HEMOSTATIC AGENTS (NO CHARGE) OPTIME
TOPICAL | Status: DC | PRN
  Administered 2023-09-08: 1 via TOPICAL

## 2023-09-08 MED ORDER — ONDANSETRON HCL 4 MG/2ML IJ SOLN
INTRAMUSCULAR | Status: DC | PRN
  Administered 2023-09-08: 4 mg via INTRAVENOUS

## 2023-09-08 MED ORDER — SUCCINYLCHOLINE CHLORIDE 200 MG/10ML IV SOSY
PREFILLED_SYRINGE | INTRAVENOUS | Status: DC | PRN
  Administered 2023-09-08: 80 mg via INTRAVENOUS

## 2023-09-08 MED ORDER — ONDANSETRON HCL 4 MG/2ML IJ SOLN: 4.0000 mg | Freq: Once | INTRAMUSCULAR | Status: DC | PRN

## 2023-09-08 MED ORDER — LIDOCAINE 2% (20 MG/ML) 5 ML SYRINGE
INTRAMUSCULAR | Status: AC
  Filled 2023-09-08: qty 5

## 2023-09-08 SURGICAL SUPPLY — 40 items
BAG COUNTER SPONGE SURGICOUNT (BAG) ×2 IMPLANT
BLADE CLIPPER SURG (BLADE) IMPLANT
CANISTER SUCTION 3000ML PPV (SUCTIONS) ×2 IMPLANT
CHLORAPREP W/TINT 26 (MISCELLANEOUS) ×2 IMPLANT
CLIP APPLIE 5 13 M/L LIGAMAX5 (MISCELLANEOUS) ×2 IMPLANT
COVER MAYO STAND STRL (DRAPES) ×2 IMPLANT
COVER SURGICAL LIGHT HANDLE (MISCELLANEOUS) ×2 IMPLANT
DRAPE C-ARM 42X120 X-RAY (DRAPES) ×2 IMPLANT
DRSG TEGADERM 2-3/8X2-3/4 SM (GAUZE/BANDAGES/DRESSINGS) ×6 IMPLANT
DRSG TEGADERM 4X4.75 (GAUZE/BANDAGES/DRESSINGS) ×2 IMPLANT
ELECTRODE REM PT RTRN 9FT ADLT (ELECTROSURGICAL) ×2 IMPLANT
GAUZE SPONGE 2X2 8PLY STRL LF (GAUZE/BANDAGES/DRESSINGS) ×2 IMPLANT
GLOVE PI ORTHO PRO STRL 7.5 (GLOVE) ×2 IMPLANT
GOWN STRL REUS W/ TWL LRG LVL3 (GOWN DISPOSABLE) ×6 IMPLANT
GOWN STRL REUS W/ TWL XL LVL3 (GOWN DISPOSABLE) ×2 IMPLANT
GRASPER SUT TROCAR 14GX15 (MISCELLANEOUS) ×2 IMPLANT
HEMOSTAT SNOW SURGICEL 2X4 (HEMOSTASIS) IMPLANT
IRRIGATION SUCT STRKRFLW 2 WTP (MISCELLANEOUS) ×2 IMPLANT
KIT BASIN OR (CUSTOM PROCEDURE TRAY) ×2 IMPLANT
KIT IMAGING PINPOINTPAQ (MISCELLANEOUS) IMPLANT
KIT TURNOVER KIT B (KITS) ×2 IMPLANT
NS IRRIG 1000ML POUR BTL (IV SOLUTION) ×2 IMPLANT
PAD ARMBOARD POSITIONER FOAM (MISCELLANEOUS) ×2 IMPLANT
POUCH RETRIEVAL ECOSAC 10 (ENDOMECHANICALS) ×2 IMPLANT
SCISSORS LAP 5X35 DISP (ENDOMECHANICALS) ×2 IMPLANT
SET CHOLANGIOGRAPH 5 50 .035 (SET/KITS/TRAYS/PACK) ×2 IMPLANT
SET TUBE SMOKE EVAC HIGH FLOW (TUBING) ×2 IMPLANT
SLEEVE ADV FIXATION 5X100MM (TROCAR) ×4 IMPLANT
SPECIMEN JAR SMALL (MISCELLANEOUS) ×2 IMPLANT
STRIP CLOSURE SKIN 1/2X4 (GAUZE/BANDAGES/DRESSINGS) ×2 IMPLANT
SUT MNCRL AB 4-0 PS2 18 (SUTURE) ×2 IMPLANT
SUT VIC AB 0 UR5 27 (SUTURE) ×2 IMPLANT
SUT VICRYL 0 UR6 27IN ABS (SUTURE) IMPLANT
TOWEL GREEN STERILE (TOWEL DISPOSABLE) ×2 IMPLANT
TOWEL GREEN STERILE FF (TOWEL DISPOSABLE) ×2 IMPLANT
TRAY LAPAROSCOPIC MC (CUSTOM PROCEDURE TRAY) ×2 IMPLANT
TROCAR ADV FIXATION 5X100MM (TROCAR) ×2 IMPLANT
TROCAR BALLN 12MMX100 BLUNT (TROCAR) ×2 IMPLANT
WARMER LAPAROSCOPE (MISCELLANEOUS) ×2 IMPLANT
WATER STERILE IRR 1000ML POUR (IV SOLUTION) ×2 IMPLANT

## 2023-09-08 NOTE — Discharge Instructions (Signed)
 CIRUGIA LAPAROSCOPICA: INSTRUCCIONES DE POST OPERATORIO.  Revise siempre los documentos que le entreguen en el lugar donde se ha hecho la [place].  SI USTED NECESITA DOCUMENTOS DE INCAPACIDAD (DISABLE) O DE PERMISO FAMILAR (FAMILY LEAVE) NECESITA TRAERLOS A LA OFICINA PARA QUE SEAN PROCESADOS. NO  SE LOS DE A SU DOCTOR. A su alta del hospital se le dara una receta para Human resources officer. Tomela como ha sido recetada, si la necesita. Si no la necesita puede tomar, Acetaminofen (Tylenol) o Ibuprofen (Advil) para aliviar dolor moderado. Continue tomando el resto de sus medicinas. Si necesita rellenar la receta, llame a la farmacia. ellos contactan a nuestra oficina pidiendo autorizacion. Este tipo de receta no pueden ser [employer] de las  5pm o [employer] fines de [place]. Con relacion a la dieta: debe ser [employer] primeros dias despues que llege a la casa. Ejemplo: sopas y galleticas. Tome bastante liquido esos dias. La [employer] de los pacientes padecen de inflamacion y cambio de coloracion de la piel alrededor de las incisiones. esto toma dias en resolver.  pnerse una bolsa de hielo en el area affectada ayuda..  Es comun tambien tener un poco de estrenimiento si esta tomado medicinas para Chief Technology Officer. incremente la cantidad de liquidos a tomar y Engineer, production (Colace) esto previene el problema. Si ya tiene estrenimiento, es Designer, jewellery no ha defecado en 48 horas, puede tomar un laxativo (Milk of Magnesia or Miralax) uselo como el paquete le explica.  A menos que se le diga algo diferente. Remueva el bendaje a las 24-48 horas despues dela [place]. y puede banarse en la ducha sin ningun problema. usted puede tener steri-strips (pequenas curitas transparentes en la piel puesta encima de la incision)  Estas banditas strips should be left on the skin for 7-10 days.   Si su cirujano puso pegamento encima de la incision usted puede banarse bajo la ducha en 24 horas. Este pegamento empezara a caerse en las  proximas 2-3 semanas. Si le pusieron suturas o presillas (grapos) estos seran quitados en su proxima cita en la oficina. . ACTIVIDADES:  Puede hacer actividad ligera.  Como caminar , subir escaleras y poco a poco irlas incrementando tanto como las [place]. Puede tener relaciones sexuales cuando sea comfortable. No carge objetos pesados o haga esfuerzos que no sean aprovados por su doctor. Puede manejar en cuanto no esta tomando medicamentos fuertes (narcoticos) para Chief Technology Officer, pueda abrochar confortablemente el cinturon de seguridad, y pueda Personnel officer y usar los pedales de su vehiculo con seguridad. PUEDE REGRESAR A TRABAJAR  Debe ver a su doctor para una cita de seguimiento en 2-3 semanas despues de la [place].  OTRAS ISNSTRUCCIONES:___________________________________________________________________________________ Debbora Lacrosse A SU MEDICO: FIEBRE mayor de  101.0 No produccion de [place]. Sangramiento continue de la herida Incremento de Engineer, mining, enrojecimientio o drenaje de la herida (incision) Incremento de dolor abdominal.  The clinic staff is available to answer your questions during regular business hours.  Please don't hesitate to call and ask to speak to one of the nurses for clinical concerns.  If you have a medical emergency, go to the nearest emergency room or call 911.  A surgeon from [facility] is always on call at the hospital. [address], [address], [place], [place]  [address] ? [address] [address], [place], [place]   [address] [phone] ? [phone] ? FAX [phone] Web site: [email or web address]

## 2023-09-08 NOTE — Interval H&P Note (Signed)
 History and Physical Interval Note:  09/08/2023 7:05 AM  Evelyn Dillon  has presented today for surgery, with the diagnosis of cholecystitis.  The various methods of treatment have been discussed with the patient and family. After consideration of risks, benefits and other options for treatment, the patient has consented to  Procedure(s) with comments: LAPAROSCOPIC CHOLECYSTECTOMY WITH INTRAOPERATIVE CHOLANGIOGRAM (N/A) - AND ICG DYE as a surgical intervention.  The patient's history has been reviewed, patient examined, no change in status, stable for surgery.  I have reviewed the patient's chart and labs.  Questions were answered to the patient's satisfaction.     [name]

## 2023-09-08 NOTE — Anesthesia Postprocedure Evaluation (Signed)
 Anesthesia Post Note  Patient: Evelyn Dillon  Procedure(s) Performed: LAPAROSCOPIC CHOLECYSTECTOMY WITH INTRAOPERATIVE CHOLANGIOGRAM DG CHOLANGIOGRAM OPERATIVE     Patient location during evaluation: PACU Anesthesia Type: General Level of consciousness: awake and alert Pain management: pain level controlled Vital Signs Assessment: post-procedure vital signs reviewed and stable Respiratory status: spontaneous breathing, nonlabored ventilation, respiratory function stable and patient connected to nasal cannula oxygen Cardiovascular status: blood pressure returned to baseline and stable Postop Assessment: no apparent nausea or vomiting Anesthetic complications: no  No notable events documented.  Last Vitals:  Vitals:   09/08/23 0945 09/08/23 1000  BP: (!) 159/83 (!) 156/84  Pulse: 73 72  Resp: 20 18  Temp:  36.4 C  SpO2: 96% 96%    Last Pain:  Vitals:   09/08/23 1000  TempSrc:   PainSc: Asleep                 Epifanio Lamar BRAVO

## 2023-09-08 NOTE — Progress Notes (Signed)
 Progress Note   Patient: Evelyn Dillon FMW:[id number] DOB: 05-Jan-1961 DOA: 09/03/2023  DOS: the patient was seen and examined on 09/08/2023   Brief hospital course:  63 y.o. female with history of recurrent pancreatitis of unknown cause the last time patient had an attack of pancreatitis was in January 2020 presented to the ER with complaints of severe abdominal pain with multiple episodes of nausea vomiting.  She was found to have elevated lipase level.  She was hospitalized for further management.     Assessment and Plan:   Acute pancreatitis - Etiology unclear.  Lipase greater than 2800 on presentation with MRCP confirming acute pancreatitis.  GI consulted and following closely.  Continue IV fluid hydration, pain control.  General surgery consulted for possible gallbladder etiology.  HIDA scan negative.  Patient feeling improved, tolerating liquid diet..     Concern for cholecystitis - MRCP noting gallbladder sludge.  HIDA scan negative.  Cholecystitis ruled out however cholecystectomy may still be warranted.  General surgery consulted.  S/P Cholecystectomy this am.     Hypokalemia - Replenishment on board.  Recheck BMP in AM.   Subjective: Pt resting comfortably this afternoon.  Returned from lap chole earlier this morning.  Tolerating clear diet. Family at beside.  No questions at this time.  Pt states she feels well.   Physical Exam:  Vitals:   09/08/23 0915 09/08/23 0930 09/08/23 0945 09/08/23 1000  BP: (!) 158/81 (!) 151/79 (!) 159/83 (!) 156/84  Pulse: 70 78 73 72  Resp: 20 18 20 18   Temp:    97.6 F (36.4 C)  TempSrc:      SpO2: 95% 93% 96% 96%  Weight:      Height:        GENERAL:  Alert, pleasant, no acute distress  HEENT:  EOMI CARDIOVASCULAR:  RRR, no murmurs appreciated RESPIRATORY:  Clear to auscultation, no wheezing, rales, or rhonchi GASTROINTESTINAL: fresh bandages EXTREMITIES:  No LE edema bilaterally NEURO:  No new focal deficits appreciated SKIN:  No  rashes noted PSYCH:  Appropriate mood and affect, anxious   Data Reviewed:  No new imaging  Previous records (including but not limited to H&P, progress notes, nursing notes, TOC management) were reviewed in assessment of this patient.  Labs: CBC: Recent Labs  Lab 09/03/23 1345 09/04/23 0058 09/05/23 0333 09/07/23 0221 09/08/23 0237  WBC 11.4* 6.6 7.7 7.9 6.2  HGB 13.9 12.6 12.0 11.2* 11.1*  HCT 41.9 39.1 36.1 34.2* 33.4*  MCV 87.8 89.5 88.3 87.0 86.3  PLT 340 264 221 213 255   Basic Metabolic Panel: Recent Labs  Lab 09/04/23 0058 09/05/23 0333 09/06/23 0212 09/07/23 0221 09/08/23 0237  NA 140 136 133* 136 137  K 4.4 3.9 3.6 3.4* 3.3*  CL 106 103 102 102 101  CO2 22 25 26  20* 24  GLUCOSE 139* 100* 142* 104* 99  BUN 8 6* 5* <5* 5*  CREATININE 0.77 0.69 0.77 0.59 0.63  CALCIUM 8.5* 8.4* 8.3* 8.4* 8.7*  MG  --  1.7 1.9 1.9  --    Liver Function Tests: Recent Labs  Lab 09/03/23 1345 09/04/23 0058 09/05/23 0333  AST 37 27 20  ALT 30 26 21   ALKPHOS 85 77 64  BILITOT 0.4 0.6 0.9  PROT 6.2* 6.1* 5.7*  ALBUMIN 4.0 3.3* 3.0*   CBG: Recent Labs  Lab 09/07/23 0732  GLUCAP 90    Scheduled Meds:  heparin   5,000 Units Subcutaneous Q8H   Continuous Infusions:  cefTRIAXone  (  ROCEPHIN )  IV Stopped (09/07/23 2221)   PRN Meds:.acetaminophen , melatonin, oxyCODONE , polyethylene glycol, prochlorperazine   Family Communication: at bedside  Disposition: Status is: Inpatient Remains inpatient appropriate because: pancreatitis , s/p lap chole     Time spent: 35 minutes  Length of inpatient stay: 4 days  Author: Carliss LELON Canales, DO 09/08/2023 1:12 PM  For on call review [email or web address].

## 2023-09-08 NOTE — Transfer of Care (Signed)
 Immediate Anesthesia Transfer of Care Note  Patient: Evelyn Dillon  Procedure(s) Performed: LAPAROSCOPIC CHOLECYSTECTOMY WITH INTRAOPERATIVE CHOLANGIOGRAM DG CHOLANGIOGRAM OPERATIVE  Patient Location: PACU  Anesthesia Type:General  Level of Consciousness: awake  Airway & Oxygen Therapy: Patient Spontanous Breathing and Patient connected to nasal cannula oxygen  Post-op Assessment: Report given to RN and Post -op Vital signs reviewed and stable  Post vital signs: Reviewed and stable  Last Vitals:  Vitals Value Taken Time  BP 160/79 09/08/23 09:11  Temp    Pulse 74 09/08/23 09:13  Resp 23 09/08/23 09:13  SpO2 95 % 09/08/23 09:13  Vitals shown include unfiled device data.  Last Pain:  Vitals:   09/08/23 0706  TempSrc:   PainSc: 0-No pain      Patients Stated Pain Goal: 0 (09/07/23 1020)  Complications: No notable events documented.

## 2023-09-08 NOTE — Op Note (Signed)
 CAYTLIN BETTER [id number] August 20, 1960 09/08/2023  Laparoscopic Cholecystectomy with intraoperative cholangiogram and indocyanine green  fluorescent cholangiography procedure Note  Indications: The patient has had several episodes and admissions for recurrent pancreatitis.  She has no risk factors for pancreatitis however she does have sludge on imaging.  She has no high triglycerides, does not drink, and not on medications that can typically be associated with pancreatitis.  We were asked to evaluate her for cholecystectomy as a potential source for her pancreatitis.  I did not think was unreasonable to offer her cholecystectomy since she does have thick sludge.    Pre-operative Diagnosis: Recurrent pancreatitis, biliary sludge, epigastric pain  Post-operative Diagnosis: same  Surgeon: [name] MD FACS  Assistants: none  Anesthesia: General endotracheal anesthesia  Procedure Details  The patient was seen again in the Holding Room. The risks, benefits, complications, treatment options, and expected outcomes were discussed with the patient. The possibilities of reaction to medication, pulmonary aspiration, perforation of viscus, bleeding, recurrent infection, finding a normal gallbladder, the need for additional procedures, failure to diagnose a condition, the possible need to convert to an open procedure, and creating a complication requiring transfusion or operation were discussed with the patient. The likelihood of improving the patient's symptoms with return to their baseline status is good.  The patient and/or family concurred with the proposed plan, giving informed consent. The site of surgery properly noted. The patient was taken to Operating Room, identified as AMISHA POSPISIL and the procedure verified as Laparoscopic Cholecystectomy with Intraoperative Cholangiogram. A Time Out was held and the above information confirmed. Antibiotic prophylaxis was administered.  The patient also received  indocyanine green  dye operatively as well  Prior to the induction of general anesthesia, antibiotic prophylaxis was administered. General endotracheal anesthesia was then administered and tolerated well. After the induction, the abdomen was prepped with Chloraprep and draped in the sterile fashion. The patient was positioned in the supine position.  Local anesthetic agent was injected into the skin near the umbilicus and an incision made. We dissected down to the abdominal fascia with blunt dissection.  The fascia was incised vertically and we entered the peritoneal cavity bluntly.  A pursestring suture of 0-Vicryl was placed around the fascial opening.  The Hasson cannula was inserted and secured with the stay suture.  Pneumoperitoneum was then created with CO2 and tolerated well without any adverse changes in the patient's vital signs. An 5-mm port was placed in the subxiphoid position.  Two 5-mm ports were placed in the right upper quadrant. All skin incisions were infiltrated with a local anesthetic agent before making the incision and placing the trocars.   We positioned the patient in reverse Trendelenburg, tilted slightly to the patient's left.  The gallbladder was identified, the fundus grasped and retracted cephalad.  The gallbladder was distended.  Adhesions were lysed bluntly and with the electrocautery where indicated, taking care not to injure any adjacent organs or viscus. The infundibulum was grasped and retracted laterally, exposing the peritoneum overlying the triangle of Calot. This was then divided and exposed in a blunt fashion. A critical view of the cystic duct and cystic artery was obtained.  The cystic duct was clearly identified and bluntly dissected circumferentially.  The Stryker infrared system was activated and we saw near infrared fluorescent cholangiography activity within the common hepatic duct, common bile duct and cystic duct.  This served a secondary confirmation of our  anatomy.  The cystic duct was ligated with a clip distally.  An incision was made in the cystic duct and the [facility] cholangiogram catheter introduced. The catheter was secured using a clip. A cholangiogram was then obtained which showed good visualization of the distal and proximal biliary tree with no sign of filling defects or obstruction.  Contrast flowed easily into the duodenum. The catheter was then removed.   The cystic duct was then ligated with clips and divided. The cystic artery (both an anterior and posterior branch ) which had been identified & dissected free were ligated with clips and divided as well.   The gallbladder was dissected from the liver bed in retrograde fashion with the electrocautery.  There was some spillage of bile from the gallbladder but no stone spillage.  The bile was very thick.  The gallbladder was removed and placed in an Ecco sac.  The gallbladder and Ecco sac were then removed through the umbilical port site. The liver bed was irrigated and inspected.  The liver bed was a little bit oozy.  Hemostasis was achieved with the electrocautery. Copious irrigation was utilized and was repeatedly aspirated until clear.  I did place a piece of surgical snow in the gallbladder fossa.  The pursestring suture was used to close the umbilical fascia.  An additional interrupted 0 Vicryl was placed at the umbilical fascia with PMI suture passer with laparoscopic guidance  We again inspected the right upper quadrant for hemostasis.  The umbilical closure was inspected and there was no air leak and nothing trapped within the closure. Pneumoperitoneum was released as we removed the trocars.  4-0 Monocryl was used to close the skin.  Dermabond was applied. The patient was then extubated and brought to the recovery room in stable condition. Instrument, sponge, and needle counts were correct at closure and at the conclusion of the case.   Findings: Distended gallbladder full of sludge Normal  intraoperative cholangiogram Positive snow  Estimated Blood Loss: Minimal         Drains: none         Specimens: Gallbladder           Complications: None; patient tolerated the procedure well.         Disposition: PACU - hemodynamically stable.         Condition: stable  Camellia HERO. Tanda, MD, FACS General, Bariatric, & Minimally Invasive Surgery [facility] Surgery,  A [facility]

## 2023-09-08 NOTE — Plan of Care (Signed)

## 2023-09-08 NOTE — Anesthesia Procedure Notes (Signed)
 Procedure Name: Intubation Date/Time: 09/08/2023 7:39 AM  Performed by: Aliscia Clayton A, CRNAPre-anesthesia Checklist: Patient identified, Emergency Drugs available, Suction available and Patient being monitored Patient Re-evaluated:Patient Re-evaluated prior to induction Oxygen Delivery Method: Circle System Utilized Preoxygenation: Pre-oxygenation with 100% oxygen Induction Type: IV induction Ventilation: Mask ventilation without difficulty Laryngoscope Size: Mac and 4 Grade View: Grade II Tube type: Oral Tube size: 7.5 mm Number of attempts: 1 Airway Equipment and Method: Stylet and Oral airway Placement Confirmation: ETT inserted through vocal cords under direct vision, positive ETCO2 and breath sounds checked- equal and bilateral Secured at: 22 cm Tube secured with: Tape Dental Injury: Teeth and Oropharynx as per pre-operative assessment  Comments: Upper right tooth chipped prior to intubation. Teeth and lips as preinduction

## 2023-09-08 NOTE — Plan of Care (Signed)
   Problem: Education: Goal: Knowledge of General Education information will improve Description: Including pain rating scale, medication(s)/side effects and non-pharmacologic comfort measures Outcome: Progressing   Problem: Activity: Goal: Risk for activity intolerance will decrease Outcome: Progressing   Problem: Elimination: Goal: Will not experience complications related to bowel motility Outcome: Progressing

## 2023-09-09 ENCOUNTER — Encounter (HOSPITAL_COMMUNITY): Payer: Self-pay | Admitting: General Surgery

## 2023-09-09 DIAGNOSIS — K838 Other specified diseases of biliary tract: Secondary | ICD-10-CM

## 2023-09-09 DIAGNOSIS — K828 Other specified diseases of gallbladder: Secondary | ICD-10-CM

## 2023-09-09 LAB — CBC
HCT: 32.7 % — ABNORMAL LOW (ref 36.0–46.0)
Hemoglobin: 10.9 g/dL — ABNORMAL LOW (ref 12.0–15.0)
MCH: 28.8 pg (ref 26.0–34.0)
MCHC: 33.3 g/dL (ref 30.0–36.0)
MCV: 86.3 fL (ref 80.0–100.0)
Platelets: 279 10*3/uL (ref 150–400)
RBC: 3.79 MIL/uL — ABNORMAL LOW (ref 3.87–5.11)
RDW: 12.4 % (ref 11.5–15.5)
WBC: 5.7 10*3/uL (ref 4.0–10.5)
nRBC: 0 % (ref 0.0–0.2)

## 2023-09-09 LAB — BASIC METABOLIC PANEL WITH GFR
Anion gap: 9 (ref 5–15)
BUN: 5 mg/dL — ABNORMAL LOW (ref 8–23)
CO2: 27 mmol/L (ref 22–32)
Calcium: 8.7 mg/dL — ABNORMAL LOW (ref 8.9–10.3)
Chloride: 101 mmol/L (ref 98–111)
Creatinine, Ser: 0.49 mg/dL (ref 0.44–1.00)
GFR, Estimated: >60 mL/min (ref >60)
Glucose, Bld: 119 mg/dL — ABNORMAL HIGH (ref 70–99)
Potassium: 3.6 mmol/L (ref 3.5–5.1)
Sodium: 137 mmol/L (ref 135–145)

## 2023-09-09 LAB — URINE CULTURE: Culture: NO GROWTH

## 2023-09-09 MED ORDER — OXYCODONE HCL 5 MG PO TABS: 5.0000 mg | ORAL_TABLET | Freq: Four times a day (QID) | ORAL | 0 refills | Status: AC | PRN

## 2023-09-09 NOTE — Progress Notes (Signed)
 Progress Note   Patient: Evelyn Dillon FMW:[id number] DOB: Apr 01, 1960 DOA: 09/03/2023  DOS: the patient was seen and examined on 09/09/2023   Brief hospital course:  63 y.o. female with history of recurrent pancreatitis of unknown cause the last time patient had an attack of pancreatitis was in January 2020 presented to the ER with complaints of severe abdominal pain with multiple episodes of nausea vomiting.  She was found to have elevated lipase level.  She was hospitalized for further management.     Assessment and Plan:   Acute pancreatitis - Etiology unclear.  Lipase greater than 2800 on presentation with MRCP confirming acute pancreatitis.  GI consulted and following closely.  Continue IV fluid hydration, pain control.  General surgery consulted for possible gallbladder etiology.  HIDA scan negative.  Patient feeling improved, tolerating advancement in diet.  Anticipate discharge today or tomorrow.   Concern for cholecystitis - MRCP noting gallbladder sludge.  HIDA scan negative.  Cholecystitis ruled out however cholecystectomy may still be warranted.  General surgery consulted.  S/P Cholecystectomy 6/27.  Pain well-controlled.  Showing improvement.   Hypokalemia - Replenishment on board.  Recheck BMP in AM.   Subjective: Patient feeling improved this morning.  Denies any fever, chills, chest pain, nausea, vomiting, abdominal pain.  Did well with clear liquids, hungry for more food.  Physical Exam:  Vitals:   09/08/23 1943 09/08/23 2200 09/09/23 0501 09/09/23 0803  BP: 133/73  133/66 118/64  Pulse: 70  (!) 57 (!) 57  Resp: 17  16 16   Temp: 99.3 F (37.4 C) 98 F (36.7 C) 98.5 F (36.9 C) 98.2 F (36.8 C)  TempSrc:      SpO2: 97%  96% 95%  Weight:      Height:        GENERAL:  Alert, pleasant, no acute distress  HEENT:  EOMI CARDIOVASCULAR:  RRR, no murmurs appreciated RESPIRATORY:  Clear to auscultation, no wheezing, rales, or rhonchi GASTROINTESTINAL: fresh  bandages EXTREMITIES:  No LE edema bilaterally NEURO:  No new focal deficits appreciated SKIN:  No rashes noted PSYCH:  Appropriate mood and affect, anxious   Data Reviewed:  No new imaging to review  Previous records (including but not limited to H&P, progress notes, nursing notes, TOC management) were reviewed in assessment of this patient.  Labs: CBC: Recent Labs  Lab 09/04/23 0058 09/05/23 0333 09/07/23 0221 09/08/23 0237 09/09/23 0341  WBC 6.6 7.7 7.9 6.2 5.7  HGB 12.6 12.0 11.2* 11.1* 10.9*  HCT 39.1 36.1 34.2* 33.4* 32.7*  MCV 89.5 88.3 87.0 86.3 86.3  PLT 264 221 213 255 279   Basic Metabolic Panel: Recent Labs  Lab 09/05/23 0333 09/06/23 0212 09/07/23 0221 09/08/23 0237 09/09/23 0341  NA 136 133* 136 137 137  K 3.9 3.6 3.4* 3.3* 3.6  CL 103 102 102 101 101  CO2 25 26 20* 24 27  GLUCOSE 100* 142* 104* 99 119*  BUN 6* 5* <5* 5* 5*  CREATININE 0.69 0.77 0.59 0.63 0.49  CALCIUM 8.4* 8.3* 8.4* 8.7* 8.7*  MG 1.7 1.9 1.9  --   --    Liver Function Tests: Recent Labs  Lab 09/03/23 1345 09/04/23 0058 09/05/23 0333  AST 37 27 20  ALT 30 26 21   ALKPHOS 85 77 64  BILITOT 0.4 0.6 0.9  PROT 6.2* 6.1* 5.7*  ALBUMIN 4.0 3.3* 3.0*   CBG: Recent Labs  Lab 09/07/23 0732  GLUCAP 90    Scheduled Meds:  acetaminophen   1,000 mg Oral Q6H   heparin   5,000 Units Subcutaneous Q8H   Continuous Infusions:  cefTRIAXone  (ROCEPHIN )  IV 1 g (09/08/23 2221)   PRN Meds:.melatonin, morphine  injection, polyethylene glycol, prochlorperazine   Family Communication: Family at bedside  Disposition: Status is: Inpatient Remains inpatient appropriate because: Pancreatitis     Time spent: 36 minutes  Length of inpatient stay: 5 days  Author: Carliss LELON Canales, DO 09/09/2023 1:00 PM  For on call review [email or web address].

## 2023-09-09 NOTE — Discharge Summary (Signed)
 Physician Discharge Summary   Patient: Evelyn Dillon: [id number] DOB: April 05, 1960  Admit date:     09/03/2023  Discharge date: 09/09/23  Discharge Physician: Carliss LELON Canales   PCP: Delbert Clam, MD   Recommendations at discharge:    Pt to be discharged home.   If you experience worsening fever, chills, chest pain, shortness of breath, or other concerning symptoms, please call your PCP or go to the emergency department immediately.  Discharge Diagnoses: Active Problems:   Acute pancreatitis   Hypokalemia   Biliary sludge  Resolved Problems:   * No resolved hospital problems. *   Hospital Course:  63 y.o. female with history of recurrent pancreatitis of unknown cause the last time patient had an attack of pancreatitis was in January 2020 presented to the ER with complaints of severe abdominal pain with multiple episodes of nausea vomiting.  She was found to have elevated lipase level.  She was hospitalized for further management.     Assessment and Plan:   Acute pancreatitis - Etiology unclear.  Lipase greater than 2800 on presentation with MRCP confirming acute pancreatitis.  GI consulted and following closely.  Continue IV fluid hydration, pain control.  General surgery consulted for possible gallbladder etiology.  HIDA scan negative.  Patient feeling improved, tolerating advancement in diet.  Discharge home with referral to GI.    Concern for cholecystitis - MRCP noting gallbladder sludge.  HIDA scan negative.  Cholecystitis ruled out however cholecystectomy may still be warranted.  General surgery consulted.  S/P Cholecystectomy 6/27.  Pain well-controlled.  Showing improvement.  Tolerating diet and eager for discharge home.  Will send home with short course of oxycodone  5mg  tabs to take as directed.  Follow up with general surgery in outpatient setting.    Hypokalemia - Replenishment on board.  .     Consultants: GI, surgery Procedures performed: laparoscopic  cholecystectomy  Disposition: Home Diet recommendation:  Discharge Diet Orders (From admission, onward)     Start     Ordered   09/09/23 0000  Diet - low sodium heart healthy        09/09/23 1855           Cardiac diet  DISCHARGE MEDICATION: Allergies as of 09/09/2023   No Known Allergies      Medication List     STOP taking these medications    acetaminophen  500 MG tablet Commonly known as: TYLENOL    meloxicam  15 MG tablet Commonly known as: MOBIC        TAKE these medications    oxyCODONE  5 MG immediate release tablet Commonly known as: Roxicodone  Take 1 tablet (5 mg total) by mouth every 6 (six) hours as needed.               Discharge Care Instructions  (From admission, onward)           Start     Ordered   09/09/23 0000  No dressing needed        09/09/23 1855            Follow-up Information     Delbert Clam, MD. Schedule an appointment as soon as possible for a visit.   Specialty: Family Medicine Why: Please call the office and schedule a hospital follow up in the next 7-10 days. Contact information: [address] [place] [address] [place] [address] [phone]         Maczis, Puja Gosai, PA-C Follow up in 3 week(s).   Specialty:  General Surgery Why: Office will call you with a follow up appointment, If you don't hear from the office, please call, Arrive 30 minutes prior to your appointment time, Please bring your insurance card and photo ID Contact information: [address] [facility] [place] [address] [phone]                 Discharge Exam: Filed Weights   09/03/23 1336 09/03/23 1540 09/08/23 0701  Weight: 75 kg 75 kg 75 kg    GENERAL:  Alert, pleasant, no acute distress  HEENT:  EOMI CARDIOVASCULAR:  RRR, no murmurs appreciated RESPIRATORY:  Clear to auscultation, no wheezing, rales, or rhonchi GASTROINTESTINAL: well healing surgical incisions EXTREMITIES:   No LE edema bilaterally NEURO:  No new focal deficits appreciated SKIN:  No rashes noted PSYCH:  Appropriate mood and affect   Condition at discharge: improving  The results of significant diagnostics from this hospitalization (including imaging, microbiology, ancillary and laboratory) are listed below for reference.   Imaging Studies: DG Cholangiogram Operative Result Date: 09/08/2023 CLINICAL DATA:  Cholecystectomy. EXAM: INTRAOPERATIVE CHOLANGIOGRAM TECHNIQUE: Cholangiographic images from the C-arm fluoroscopic device were submitted for interpretation post-operatively. Please see the procedural report for the amount of contrast and the fluoroscopy time utilized. FLUOROSCOPY: Radiation Exposure Index (as provided by the fluoroscopic device): 2.0 mGy Kerma COMPARISON:  Right upper quadrant ultrasound on 09/04/2023 and MRI on 09/05/2023. FINDINGS: Intraoperative imaging with a C-arm demonstrates normal opacified cystic duct stump, common bile duct and visualized intrahepatic ducts. No evidence of biliary dilatation, stricture, filling defect or contrast extravasation. Contrast enters the duodenum normally. IMPRESSION: Normal intraoperative cholangiogram. Electronically Signed   By: Marcey Moan M.D.   On: 09/08/2023 12:22   NM Hepato W/EF Result Date: 09/06/2023 CLINICAL DATA:  Right upper quadrant abdominal pain EXAM: NUCLEAR MEDICINE HEPATOBILIARY IMAGING TECHNIQUE: Sequential images of the abdomen were obtained out to 60 minutes following intravenous administration of radiopharmaceutical. RADIOPHARMACEUTICALS:  Five mCi Tc-21m  Choletec  IV COMPARISON:  MRCP 09/05/2023 FINDINGS: Satisfactory uptake of radiopharmaceutical from the blood pool. Biliary activity at 8 minutes with bowel activity visible 14 minutes, indicating common bile duct patency. Small incidental amount of radiopharmaceutical reflux into the stomach. Initial non filling of the gallbladder. We administered the patient 2 mg of  morphine  to cause contraction of the sphincter of OD. Subsequent prompt filling of the gallbladder observed indicating cystic duct patency. IMPRESSION: 1. Normal hepatobiliary scan, with normal hepatocellular uptake of radiopharmaceutical and patent cystic duct and common bile duct. Electronically Signed   By: Ryan Salvage M.D.   On: 09/06/2023 13:37   MR ABDOMEN MRCP W WO CONTAST Result Date: 09/05/2023 CLINICAL DATA:  Recurrent pancreatitis. EXAM: MRI ABDOMEN WITHOUT AND WITH CONTRAST (INCLUDING MRCP) TECHNIQUE: Multiplanar multisequence MR imaging of the abdomen was performed both before and after the administration of intravenous contrast. Heavily T2-weighted images of the biliary and pancreatic ducts were obtained, and three-dimensional MRCP images were rendered by post processing. CONTRAST:  7mL GADAVIST  GADOBUTROL  1 MMOL/ML IV SOLN COMPARISON:  CT 09/03/2023 and U/S abdomen limited 09/04/2023. FINDINGS: Lower chest: There is trace pleural fluid noted within the lung bases along with overlying atelectasis versus consolidation. Hepatobiliary: Mild perihepatic fluid. Diffuse hepatic steatosis. No suspicious liver lesion.There is sludge noted within the gallbladder but no gallstones. No significant gallbladder wall thickening identified. The common bile duct measures up to 6 mm in diameter. No signs of choledocholithiasis Pancreas: There is diffuse pancreatic edema. Peripancreatic soft tissue stranding  is identified there is also peripancreatic fluid which extends into bilateral retroperitoneum. No focal loculated fluid collections identified to suggest pseudocyst. No main duct dilatation. Spleen: Mild perisplenic fluid. No focal splenic abnormality noted. Adrenals/Urinary Tract: No masses identified. No evidence of hydronephrosis. Stomach/Bowel: No acute abnormality. Vascular/Lymphatic: Normal appearance of the abdominal aorta. The upper abdominal vascularity appears patent. No significant abdominal  adenopathy. Other:  None Musculoskeletal: No suspicious bone lesions identified. IMPRESSION: 1. Imaging findings compatible with acute pancreatitis. No focal loculated fluid collections identified to suggest pseudocyst. 2. Sludge noted within the gallbladder but no gallstones. No signs of choledocholithiasis. 3. Diffuse hepatic steatosis. 4. Trace pleural fluid noted within the lung bases along with overlying atelectasis versus consolidation. Electronically Signed   By: Waddell Calk M.D.   On: 09/05/2023 06:34   MR 3D Recon At Scanner Result Date: 09/05/2023 CLINICAL DATA:  Recurrent pancreatitis. EXAM: MRI ABDOMEN WITHOUT AND WITH CONTRAST (INCLUDING MRCP) TECHNIQUE: Multiplanar multisequence MR imaging of the abdomen was performed both before and after the administration of intravenous contrast. Heavily T2-weighted images of the biliary and pancreatic ducts were obtained, and three-dimensional MRCP images were rendered by post processing. CONTRAST:  7mL GADAVIST  GADOBUTROL  1 MMOL/ML IV SOLN COMPARISON:  CT 09/03/2023 and U/S abdomen limited 09/04/2023. FINDINGS: Lower chest: There is trace pleural fluid noted within the lung bases along with overlying atelectasis versus consolidation. Hepatobiliary: Mild perihepatic fluid. Diffuse hepatic steatosis. No suspicious liver lesion.There is sludge noted within the gallbladder but no gallstones. No significant gallbladder wall thickening identified. The common bile duct measures up to 6 mm in diameter. No signs of choledocholithiasis Pancreas: There is diffuse pancreatic edema. Peripancreatic soft tissue stranding is identified there is also peripancreatic fluid which extends into bilateral retroperitoneum. No focal loculated fluid collections identified to suggest pseudocyst. No main duct dilatation. Spleen: Mild perisplenic fluid. No focal splenic abnormality noted. Adrenals/Urinary Tract: No masses identified. No evidence of hydronephrosis. Stomach/Bowel: No  acute abnormality. Vascular/Lymphatic: Normal appearance of the abdominal aorta. The upper abdominal vascularity appears patent. No significant abdominal adenopathy. Other:  None Musculoskeletal: No suspicious bone lesions identified. IMPRESSION: 1. Imaging findings compatible with acute pancreatitis. No focal loculated fluid collections identified to suggest pseudocyst. 2. Sludge noted within the gallbladder but no gallstones. No signs of choledocholithiasis. 3. Diffuse hepatic steatosis. 4. Trace pleural fluid noted within the lung bases along with overlying atelectasis versus consolidation. Electronically Signed   By: Waddell Calk M.D.   On: 09/05/2023 06:34   US  Abdomen Limited RUQ (LIVER/GB) Result Date: 09/04/2023 CLINICAL DATA:  Right upper quadrant pain.  Pancreatitis EXAM: ULTRASOUND ABDOMEN LIMITED RIGHT UPPER QUADRANT COMPARISON:  CT 09/03/2023.  Ultrasound 03/31/2018. FINDINGS: Gallbladder: No gallstones or wall thickening visualized. No sonographic Murphy sign noted by sonographer. Common bile duct: Diameter: 5 mm Liver: Diffusely echogenic hepatic parenchyma consistent with fatty liver infiltration. Portal vein is patent on color Doppler imaging with normal direction of blood flow towards the liver. Other: None. IMPRESSION: Fatty liver infiltration.  No gallstones or ductal dilatation. Electronically Signed   By: Ranell Bring M.D.   On: 09/04/2023 12:15   CT ABDOMEN PELVIS W CONTRAST Result Date: 09/03/2023 CLINICAL DATA:  Pancreatitis suspected. EXAM: CT ABDOMEN AND PELVIS WITH CONTRAST TECHNIQUE: Multidetector CT imaging of the abdomen and pelvis was performed using the standard protocol following bolus administration of intravenous contrast. RADIATION DOSE REDUCTION: This exam was performed according to the departmental dose-optimization program which includes automated exposure control, adjustment of the mA and/or kV according  to patient size and/or use of iterative reconstruction  technique. CONTRAST:  75mL OMNIPAQUE  IOHEXOL  350 MG/ML SOLN COMPARISON:  CT abdomen and pelvis 02/11/2016. FINDINGS: Lower chest: There are minimal patchy airspace opacities in the right lower lobe. Hepatobiliary: There is diffuse fatty infiltration of the liver. The liver is enlarged. Gallbladder and bile ducts are within normal limits. Skip there is moderate diffuse peripancreatic fluid and stranding compatible with acute pancreatitis. Pancreas enhances normally. There is no ductal dilatation or fluid collection. Pancreas: Unremarkable. No pancreatic ductal dilatation or surrounding inflammatory changes. Spleen: Normal in size without focal abnormality. Adrenals/Urinary Tract: Adrenal glands are unremarkable. Kidneys are normal, without renal calculi, focal lesion, or hydronephrosis. Bladder is unremarkable. Stomach/Bowel: Stomach is within normal limits. Appendix appears normal. No evidence of bowel wall thickening, distention, or inflammatory changes. There is diffuse colonic diverticulosis. Vascular/Lymphatic: No significant vascular findings are present. No enlarged abdominal or pelvic lymph nodes. Reproductive: Uterus and bilateral adnexa are unremarkable. Other: No abdominal wall hernia or abnormality. No abdominopelvic ascites. Musculoskeletal: No fracture is seen. IMPRESSION: 1. Findings compatible with acute pancreatitis. No evidence for necrosis or fluid collection. 2. Hepatomegaly with fatty infiltration of the liver. 3. Colonic diverticulosis. 4. Minimal patchy airspace opacities in the right lower lobe, possibly infectious/inflammatory. Electronically Signed   By: Greig Pique M.D.   On: 09/03/2023 17:38    Microbiology: Results for orders placed or performed during the hospital encounter of 09/03/23  Urine Culture     Status: None   Collection Time: 09/07/23 10:32 PM   Specimen: Urine, Random  Result Value Ref Range Status   Specimen Description URINE, RANDOM  Final   Special Requests NONE   Final   Culture   Final    NO GROWTH Performed at [facility], [address] [place], [place] [address]    Report Status 09/09/2023 FINAL  Final    Labs: CBC: Recent Labs  Lab 09/04/23 0058 09/05/23 0333 09/07/23 0221 09/08/23 0237 09/09/23 0341  WBC 6.6 7.7 7.9 6.2 5.7  HGB 12.6 12.0 11.2* 11.1* 10.9*  HCT 39.1 36.1 34.2* 33.4* 32.7*  MCV 89.5 88.3 87.0 86.3 86.3  PLT 264 221 213 255 279   Basic Metabolic Panel: Recent Labs  Lab 09/05/23 0333 09/06/23 0212 09/07/23 0221 09/08/23 0237 09/09/23 0341  NA 136 133* 136 137 137  K 3.9 3.6 3.4* 3.3* 3.6  CL 103 102 102 101 101  CO2 25 26 20* 24 27  GLUCOSE 100* 142* 104* 99 119*  BUN 6* 5* <5* 5* 5*  CREATININE 0.69 0.77 0.59 0.63 0.49  CALCIUM 8.4* 8.3* 8.4* 8.7* 8.7*  MG 1.7 1.9 1.9  --   --    Liver Function Tests: Recent Labs  Lab 09/03/23 1345 09/04/23 0058 09/05/23 0333  AST 37 27 20  ALT 30 26 21   ALKPHOS 85 77 64  BILITOT 0.4 0.6 0.9  PROT 6.2* 6.1* 5.7*  ALBUMIN 4.0 3.3* 3.0*   CBG: Recent Labs  Lab 09/07/23 0732  GLUCAP 90    Discharge time spent: 45 minutes.  Length of inpatient stay: 5 days  Signed: Carliss LELON Canales, DO [facility] 09/09/2023

## 2023-09-09 NOTE — Progress Notes (Signed)
 Progress Note: General Surgery Service   Chief Complaint/Subjective: Feeling sore, no pain like before  Objective: Vital signs in last 24 hours: Temp:  [98 F (36.7 C)-99.3 F (37.4 C)] 98.2 F (36.8 C) (06/28 0803) Pulse Rate:  [57-70] 57 (06/28 0803) Resp:  [16-17] 16 (06/28 0803) BP: (118-133)/(64-73) 118/64 (06/28 0803) SpO2:  [95 %-97 %] 95 % (06/28 0803) Last BM Date : 09/03/23 (Limited PO intake; Miralax  given)  Intake/Output from previous day: 06/27 0701 - 06/28 0700 In: 900 [I.V.:900] Out: -  Intake/Output this shift: No intake/output data recorded.  GI: Abd incisions c/d/I, soft, tender around incisions  Lab Results: CBC  Recent Labs    09/08/23 0237 09/09/23 0341  WBC 6.2 5.7  HGB 11.1* 10.9*  HCT 33.4* 32.7*  PLT 255 279   BMET Recent Labs    09/08/23 0237 09/09/23 0341  NA 137 137  K 3.3* 3.6  CL 101 101  CO2 24 27  GLUCOSE 99 119*  BUN 5* 5*  CREATININE 0.63 0.49  CALCIUM 8.7* 8.7*   PT/INR No results for input(s): LABPROT, INR in the last 72 hours. ABG No results for input(s): PHART, HCO3 in the last 72 hours.  Invalid input(s): PCO2, PO2  Anti-infectives: Anti-infectives (From admission, onward)    Start     Dose/Rate Route Frequency Ordered Stop   09/08/23 0617  ceFAZolin  (ANCEF ) 2-4 GM/100ML-% IVPB  Status:  Discontinued       Note to Pharmacy: Terrence, Destiny: cabinet override      09/08/23 0617 09/08/23 0622   09/08/23 0600  ceFAZolin  (ANCEF ) IVPB 2g/100 mL premix        2 g 200 mL/hr over 30 Minutes Intravenous On call to O.R. 09/07/23 1136 09/08/23 0740   09/07/23 1100  ceFAZolin  (ANCEF ) IVPB 2g/100 mL premix  Status:  Discontinued        2 g 200 mL/hr over 30 Minutes Intravenous On call to O.R. 09/07/23 1000 09/07/23 1136   09/07/23 0100  cefTRIAXone  (ROCEPHIN ) 1 g in sodium chloride  0.9 % 100 mL IVPB        1 g 200 mL/hr over 30 Minutes Intravenous Daily at bedtime 09/07/23 0045          Medications: Scheduled Meds:  acetaminophen   1,000 mg Oral Q6H   heparin   5,000 Units Subcutaneous Q8H   Continuous Infusions:  cefTRIAXone  (ROCEPHIN )  IV 1 g (09/08/23 2221)   PRN Meds:.melatonin, morphine  injection, polyethylene glycol, prochlorperazine   Assessment/Plan: Evelyn Dillon underwent laparoscopic cholecystectomy with IOC for recurrent pancreatitis with cholelithiasis on 09/08/23.  Doing well POD1 Plan for discharge home Sunday in the morning once pain control improves    LOS: 5 days     Evelyn JINNY Foy, MD  [facility] Use [email or web address] to contact on call provider  Daily Billing: [phone] - post op

## 2023-09-09 NOTE — Progress Notes (Signed)
      Progress Note   Subjective  Patient is s/p cholecystectomy yesterday. Improving. Tolerating diet as of this AM.     Objective   Vital signs in last 24 hours: Temp:  [98 F (36.7 C)-99.3 F (37.4 C)] 98.2 F (36.8 C) (06/28 0803) Pulse Rate:  [57-70] 57 (06/28 0803) Resp:  [16-17] 16 (06/28 0803) BP: (118-133)/(64-73) 118/64 (06/28 0803) SpO2:  [95 %-97 %] 95 % (06/28 0803) Last BM Date : 09/03/23 (Limited PO intake; Miralax  given) General:    hispanic female in NAD Psych:  Cooperative. Normal mood and affect.  Intake/Output from previous day: 06/27 0701 - 06/28 0700 In: 900 [I.V.:900] Out: -  Intake/Output this shift: No intake/output data recorded.  Lab Results: Recent Labs    09/07/23 0221 09/08/23 0237 09/09/23 0341  WBC 7.9 6.2 5.7  HGB 11.2* 11.1* 10.9*  HCT 34.2* 33.4* 32.7*  PLT 213 255 279   BMET Recent Labs    09/07/23 0221 09/08/23 0237 09/09/23 0341  NA 136 137 137  K 3.4* 3.3* 3.6  CL 102 101 101  CO2 20* 24 27  GLUCOSE 104* 99 119*  BUN <5* 5* 5*  CREATININE 0.59 0.63 0.49  CALCIUM 8.4* 8.7* 8.7*   LFT No results for input(s): PROT, ALBUMIN, AST, ALT, ALKPHOS, BILITOT, BILIDIR, IBILI in the last 72 hours. PT/INR No results for input(s): LABPROT, INR in the last 72 hours.  Studies/Results: DG Cholangiogram Operative Result Date: 09/08/2023 CLINICAL DATA:  Cholecystectomy. EXAM: INTRAOPERATIVE CHOLANGIOGRAM TECHNIQUE: Cholangiographic images from the C-arm fluoroscopic device were submitted for interpretation post-operatively. Please see the procedural report for the amount of contrast and the fluoroscopy time utilized. FLUOROSCOPY: Radiation Exposure Index (as provided by the fluoroscopic device): 2.0 mGy Kerma COMPARISON:  Right upper quadrant ultrasound on 09/04/2023 and MRI on 09/05/2023. FINDINGS: Intraoperative imaging with a C-arm demonstrates normal opacified cystic duct stump, common bile duct and visualized  intrahepatic ducts. No evidence of biliary dilatation, stricture, filling defect or contrast extravasation. Contrast enters the duodenum normally. IMPRESSION: Normal intraoperative cholangiogram. Electronically Signed   By: Marcey Moan M.D.   On: 09/08/2023 12:22       Assessment / Plan:    63 y/o female here with the following:  Recurrent acute pancreatitis - sludge in GB on imaging S/p cholecystectomy  Appears to be recovering appropriately from pancreatitis. Tolerating diet, pain improved. Hopefully she continues to improve with supportive measures and if sludge / microlithiasis were causing recurrent pancreatitis, this will hopefully prevent future episodes. She states she would like to stay today and discharge tomorrow if she continues to improve.  We will sign off for now, call with questions in the interim.  Marcey Naval, MD [facility]

## 2023-09-09 NOTE — Plan of Care (Signed)
   Problem: Education: Goal: Knowledge of General Education information will improve Description Including pain rating scale, medication(s)/side effects and non-pharmacologic comfort measures Outcome: Progressing

## 2023-09-09 NOTE — Plan of Care (Signed)
   Problem: Education: Goal: Knowledge of General Education information will improve Description: Including pain rating scale, medication(s)/side effects and non-pharmacologic comfort measures Outcome: Progressing   Problem: Health Behavior/Discharge Planning: Goal: Ability to manage health-related needs will improve Outcome: Progressing   Problem: Nutrition: Goal: Adequate nutrition will be maintained Outcome: Progressing

## 2023-09-12 LAB — SURGICAL PATHOLOGY

## 2023-10-25 ENCOUNTER — Encounter: Payer: Self-pay | Admitting: Internal Medicine

## 2023-11-09 ENCOUNTER — Other Ambulatory Visit: Payer: Self-pay
